# Patient Record
Sex: Male | Born: 1960 | Race: White | Hispanic: No | Marital: Married | Smoking: Never smoker
Health system: Southern US, Community
[De-identification: ages and names within clinical notes are randomized; demographics above are authoritative.]

## PROBLEM LIST (undated history)

## (undated) DIAGNOSIS — M1990 Unspecified osteoarthritis, unspecified site: Secondary | ICD-10-CM

## (undated) DIAGNOSIS — E349 Endocrine disorder, unspecified: Secondary | ICD-10-CM

## (undated) DIAGNOSIS — R911 Solitary pulmonary nodule: Secondary | ICD-10-CM

## (undated) DIAGNOSIS — I10 Essential (primary) hypertension: Secondary | ICD-10-CM

## (undated) DIAGNOSIS — I499 Cardiac arrhythmia, unspecified: Secondary | ICD-10-CM

## (undated) DIAGNOSIS — L723 Sebaceous cyst: Secondary | ICD-10-CM

## (undated) DIAGNOSIS — D649 Anemia, unspecified: Secondary | ICD-10-CM

## (undated) DIAGNOSIS — I480 Paroxysmal atrial fibrillation: Secondary | ICD-10-CM

## (undated) DIAGNOSIS — E559 Vitamin D deficiency, unspecified: Secondary | ICD-10-CM

## (undated) DIAGNOSIS — K5792 Diverticulitis of intestine, part unspecified, without perforation or abscess without bleeding: Secondary | ICD-10-CM

## (undated) DIAGNOSIS — K219 Gastro-esophageal reflux disease without esophagitis: Secondary | ICD-10-CM

## (undated) DIAGNOSIS — R7309 Other abnormal glucose: Secondary | ICD-10-CM

## (undated) DIAGNOSIS — E291 Testicular hypofunction: Secondary | ICD-10-CM

## (undated) DIAGNOSIS — E785 Hyperlipidemia, unspecified: Secondary | ICD-10-CM

## (undated) HISTORY — DX: Anemia, unspecified: D64.9

## (undated) HISTORY — DX: Sebaceous cyst: L72.3

## (undated) HISTORY — DX: Vitamin D deficiency, unspecified: E55.9

## (undated) HISTORY — DX: Paroxysmal atrial fibrillation: I48.0

## (undated) HISTORY — DX: Essential (primary) hypertension: I10

## (undated) HISTORY — DX: Other abnormal glucose: R73.09

## (undated) HISTORY — DX: Diverticulitis of intestine, part unspecified, without perforation or abscess without bleeding: K57.92

## (undated) HISTORY — DX: Endocrine disorder, unspecified: E34.9

## (undated) HISTORY — PX: COLONOSCOPY: SHX174

## (undated) HISTORY — DX: Cardiac arrhythmia, unspecified: I49.9

## (undated) HISTORY — DX: Solitary pulmonary nodule: R91.1

## (undated) HISTORY — DX: Testicular hypofunction: E29.1

## (undated) HISTORY — DX: Hyperlipidemia, unspecified: E78.5

---

## 2005-12-29 ENCOUNTER — Encounter: Payer: Self-pay | Admitting: Gastroenterology

## 2007-07-26 HISTORY — PX: KNEE ARTHROSCOPY W/ MENISCAL REPAIR: SHX1877

## 2008-06-05 ENCOUNTER — Encounter: Admission: RE | Admit: 2008-06-05 | Discharge: 2008-06-05 | Payer: Self-pay | Admitting: *Deleted

## 2008-07-31 ENCOUNTER — Encounter: Admission: RE | Admit: 2008-07-31 | Discharge: 2008-07-31 | Payer: Self-pay | Admitting: *Deleted

## 2009-03-31 ENCOUNTER — Encounter: Payer: Self-pay | Admitting: Gastroenterology

## 2009-05-25 ENCOUNTER — Ambulatory Visit: Payer: Self-pay | Admitting: Gastroenterology

## 2009-06-15 ENCOUNTER — Ambulatory Visit: Payer: Self-pay | Admitting: Gastroenterology

## 2009-10-09 IMAGING — CR DG CHEST 2V
3 series · 3 of 3 positions shown · non-contrast
Comparison: None

CLINICAL DATA: Asbestos medical surveillance.  Previous exposure to
asbestos.

CHEST - 2 VIEW

[view not recorded (1 of 3)]
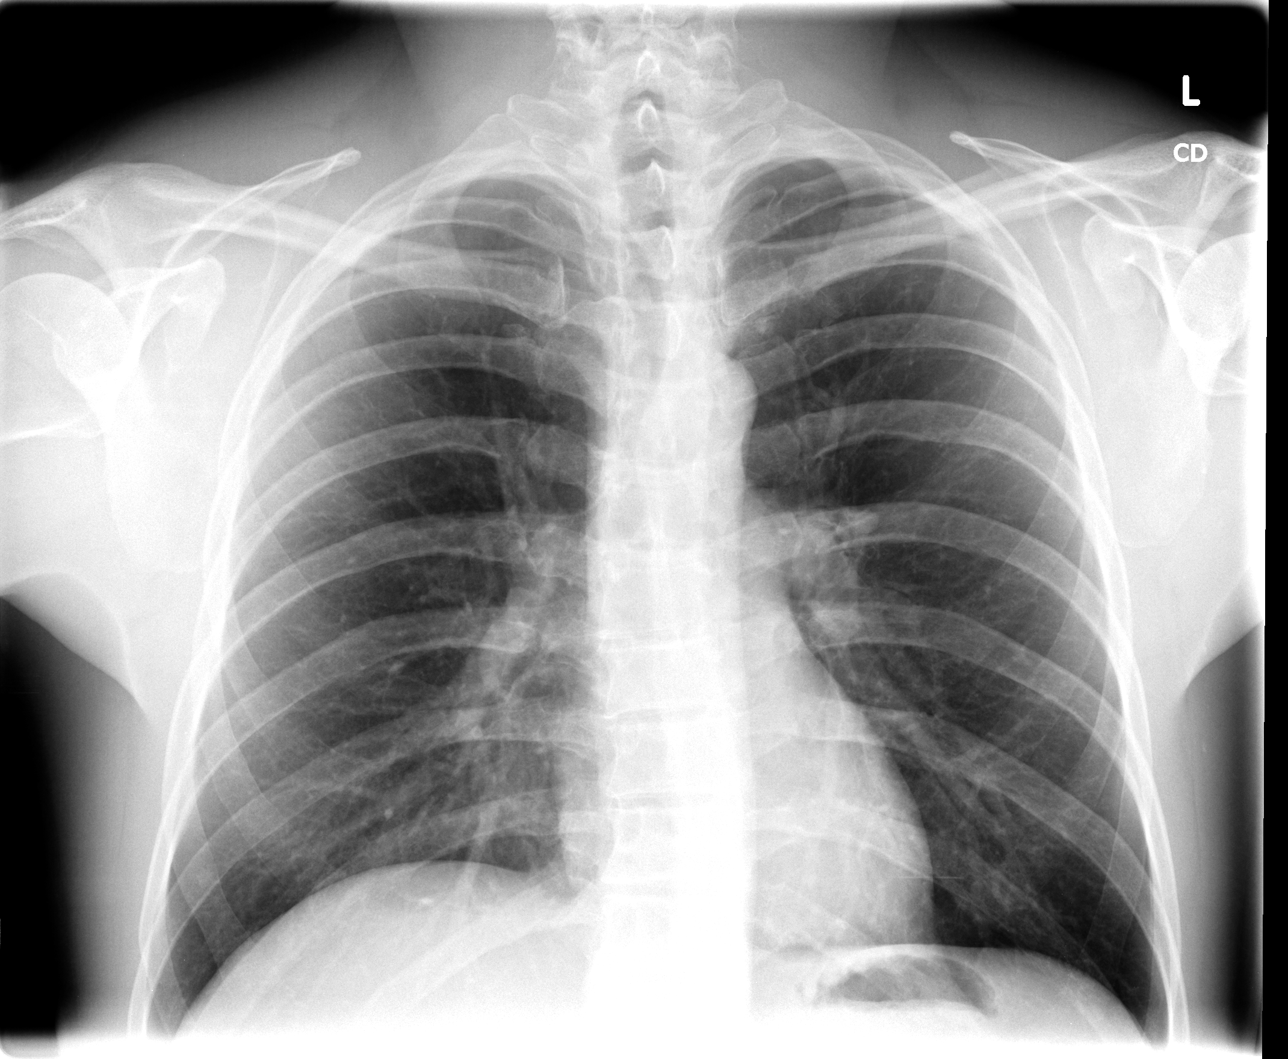

[view not recorded (2 of 3)]
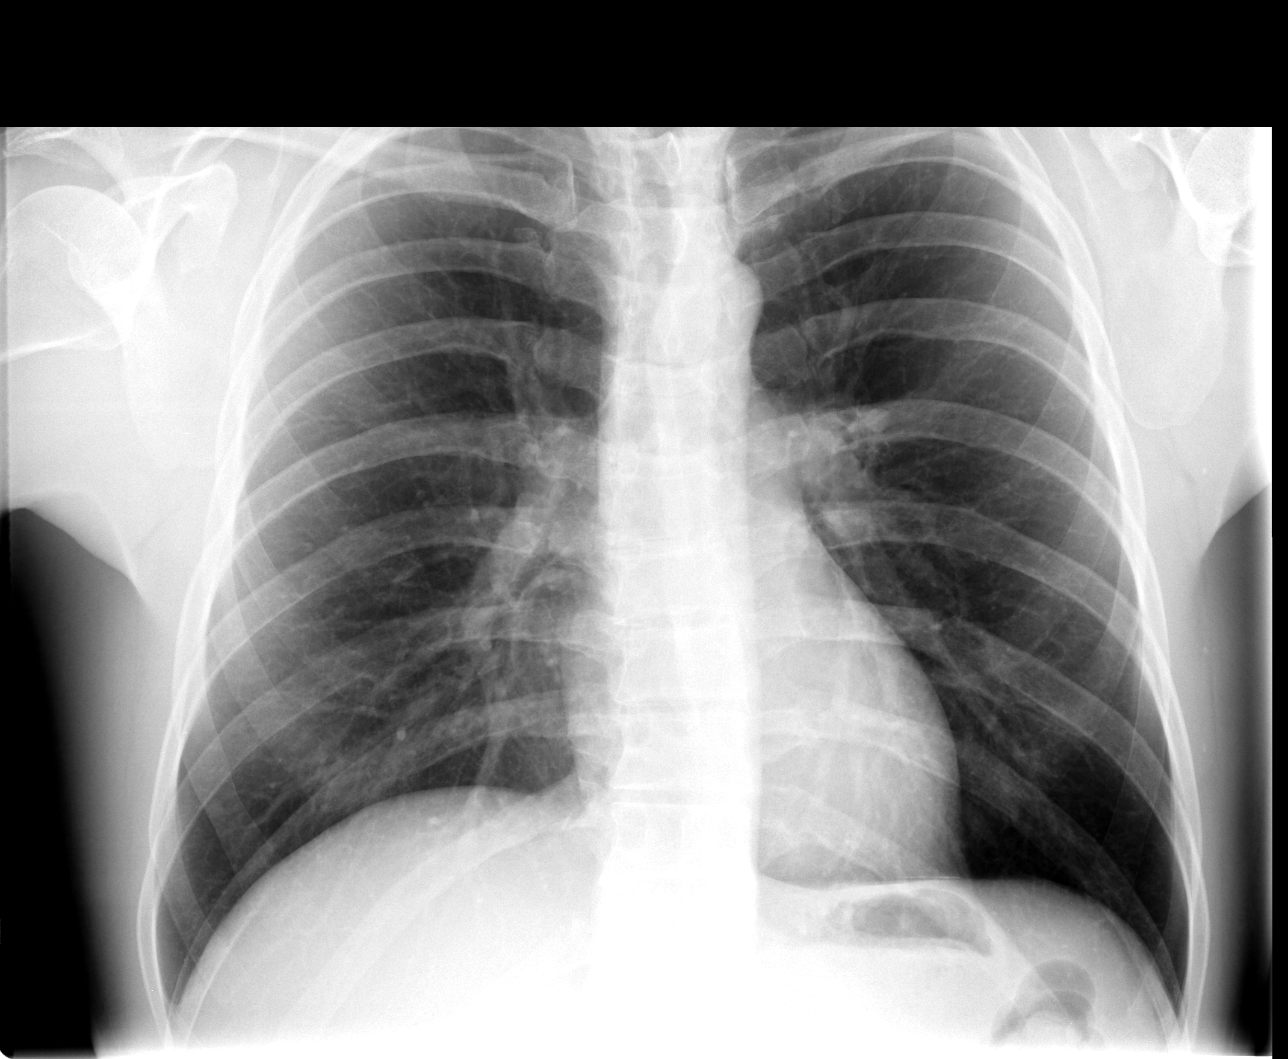

[view not recorded (3 of 3)]
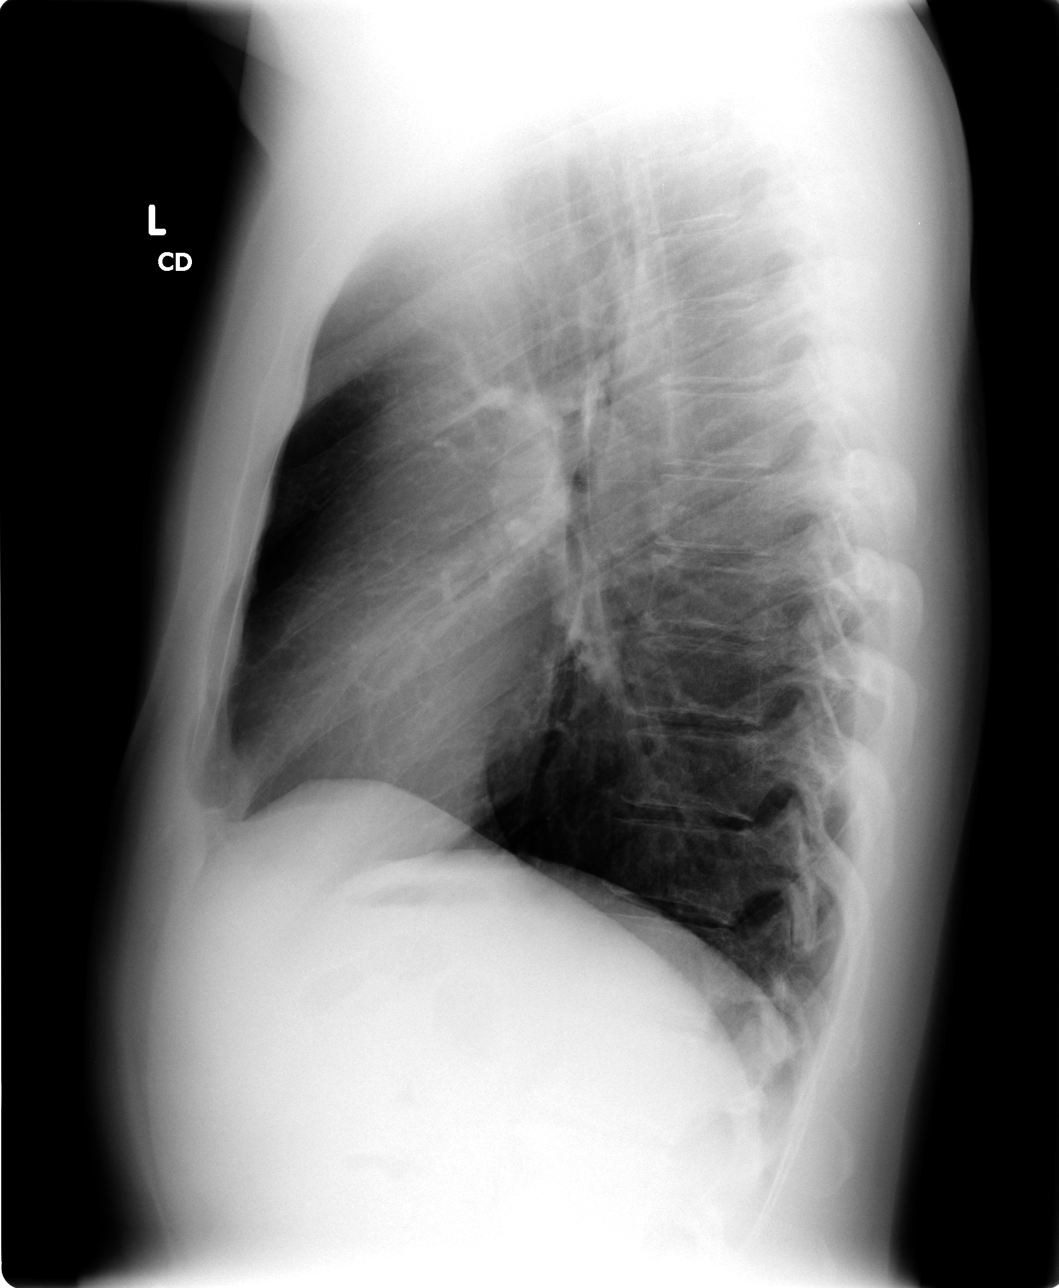

[3 of 3 positions shown; findings below may reference images not displayed]

FINDINGS: No findings to suggest asbestosis, related chest
findings.  No evidence of pleural thickening/calcifications.  No
accentuation of interstitial markings.  5 mm nodule in the medial
aspect of the left upper lobea this may represent a granuloma.
IMPRESSION: No acute chest findings.  No findings to suggest
asbestosis.

 5 mm left upper lobe nodule. Recommend follow-up CXR:  If the
patient is at high risk for bronchogenic carcinoma, follow-up chest
CT at 6-12 months is recommended.  If the patient is at low risk
for bronchogenic carcinoma, follow-up chest CT at 12 months is
recommended.  This recommendation follows the consensus statement:
"Guidelines for Management of Small Pulmonary Nodules Detected on
CT Scans: A Statement from the [HOSPITAL]" as published in
[URL]

## 2009-12-04 IMAGING — CR DG CHEST 2V
3 series · 3 of 3 positions shown · non-contrast
Comparison: 06/05/2008

CLINICAL DATA: Asbestos.  Medical surveillance.

CHEST - 2 VIEW

[view not recorded (1 of 3)]
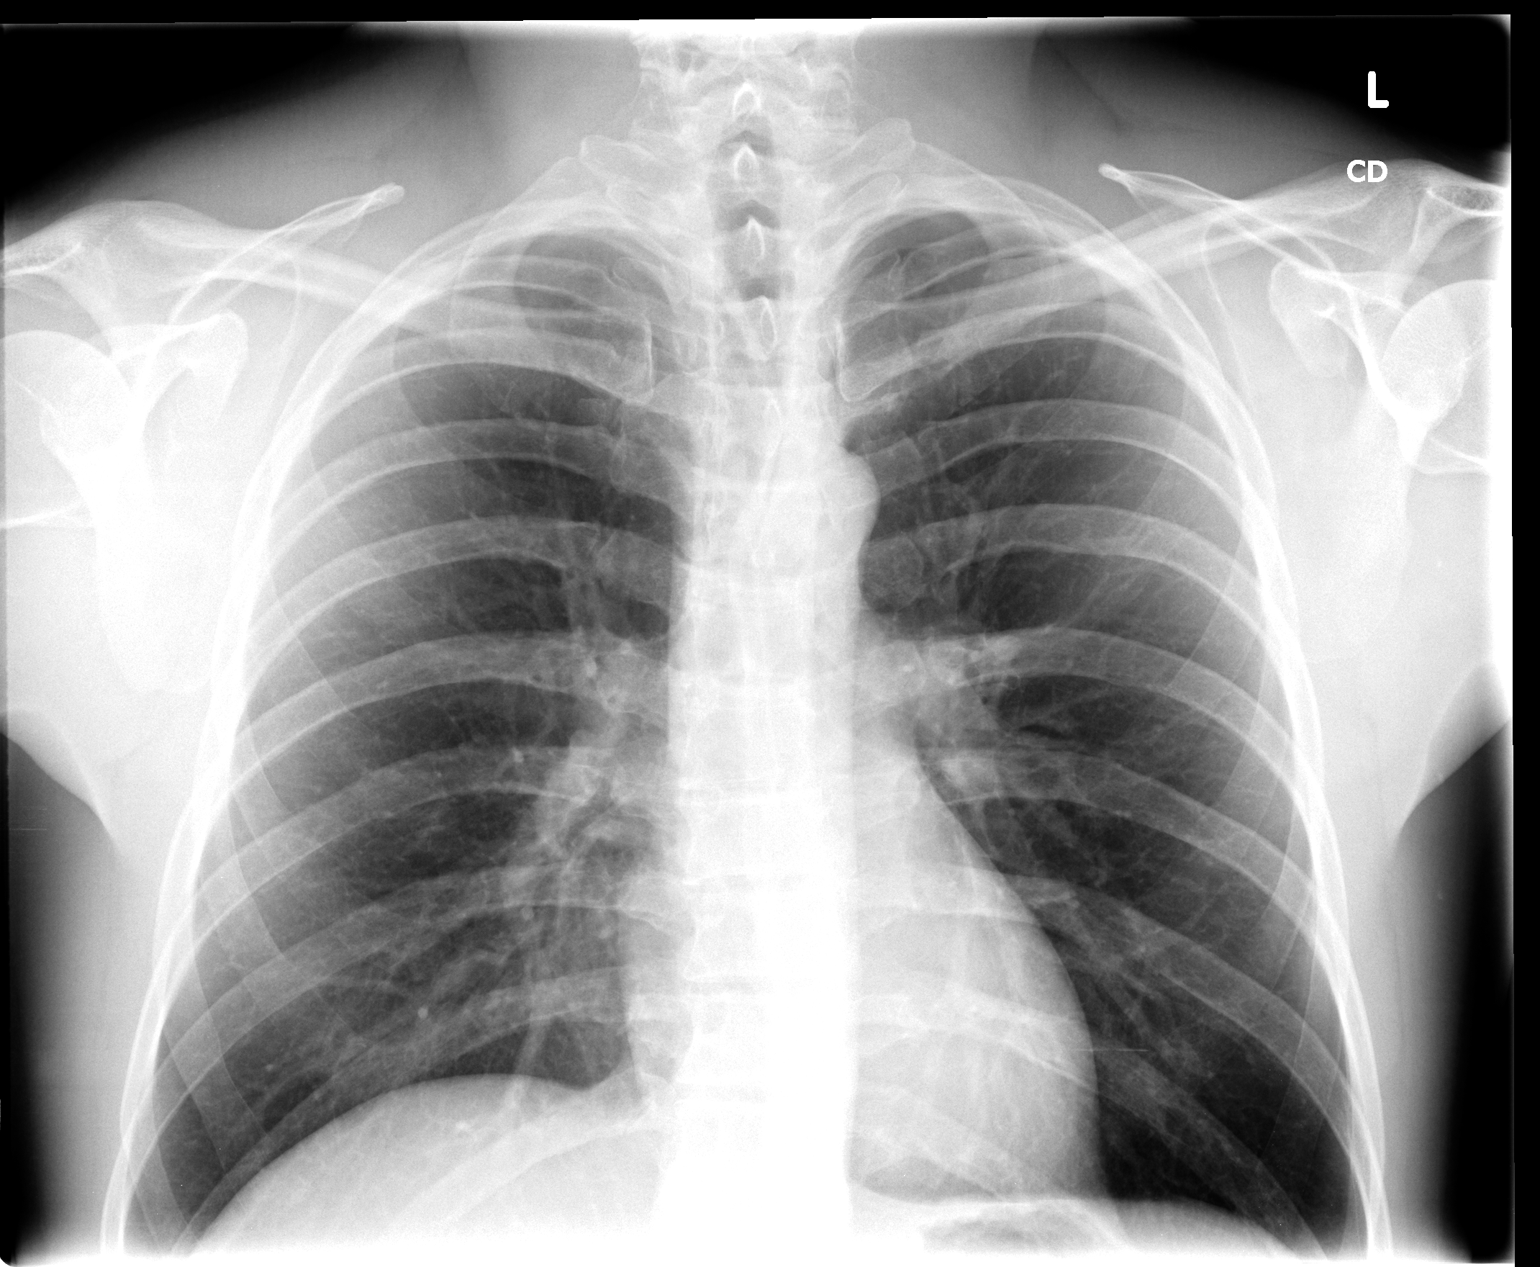

[view not recorded (2 of 3)]
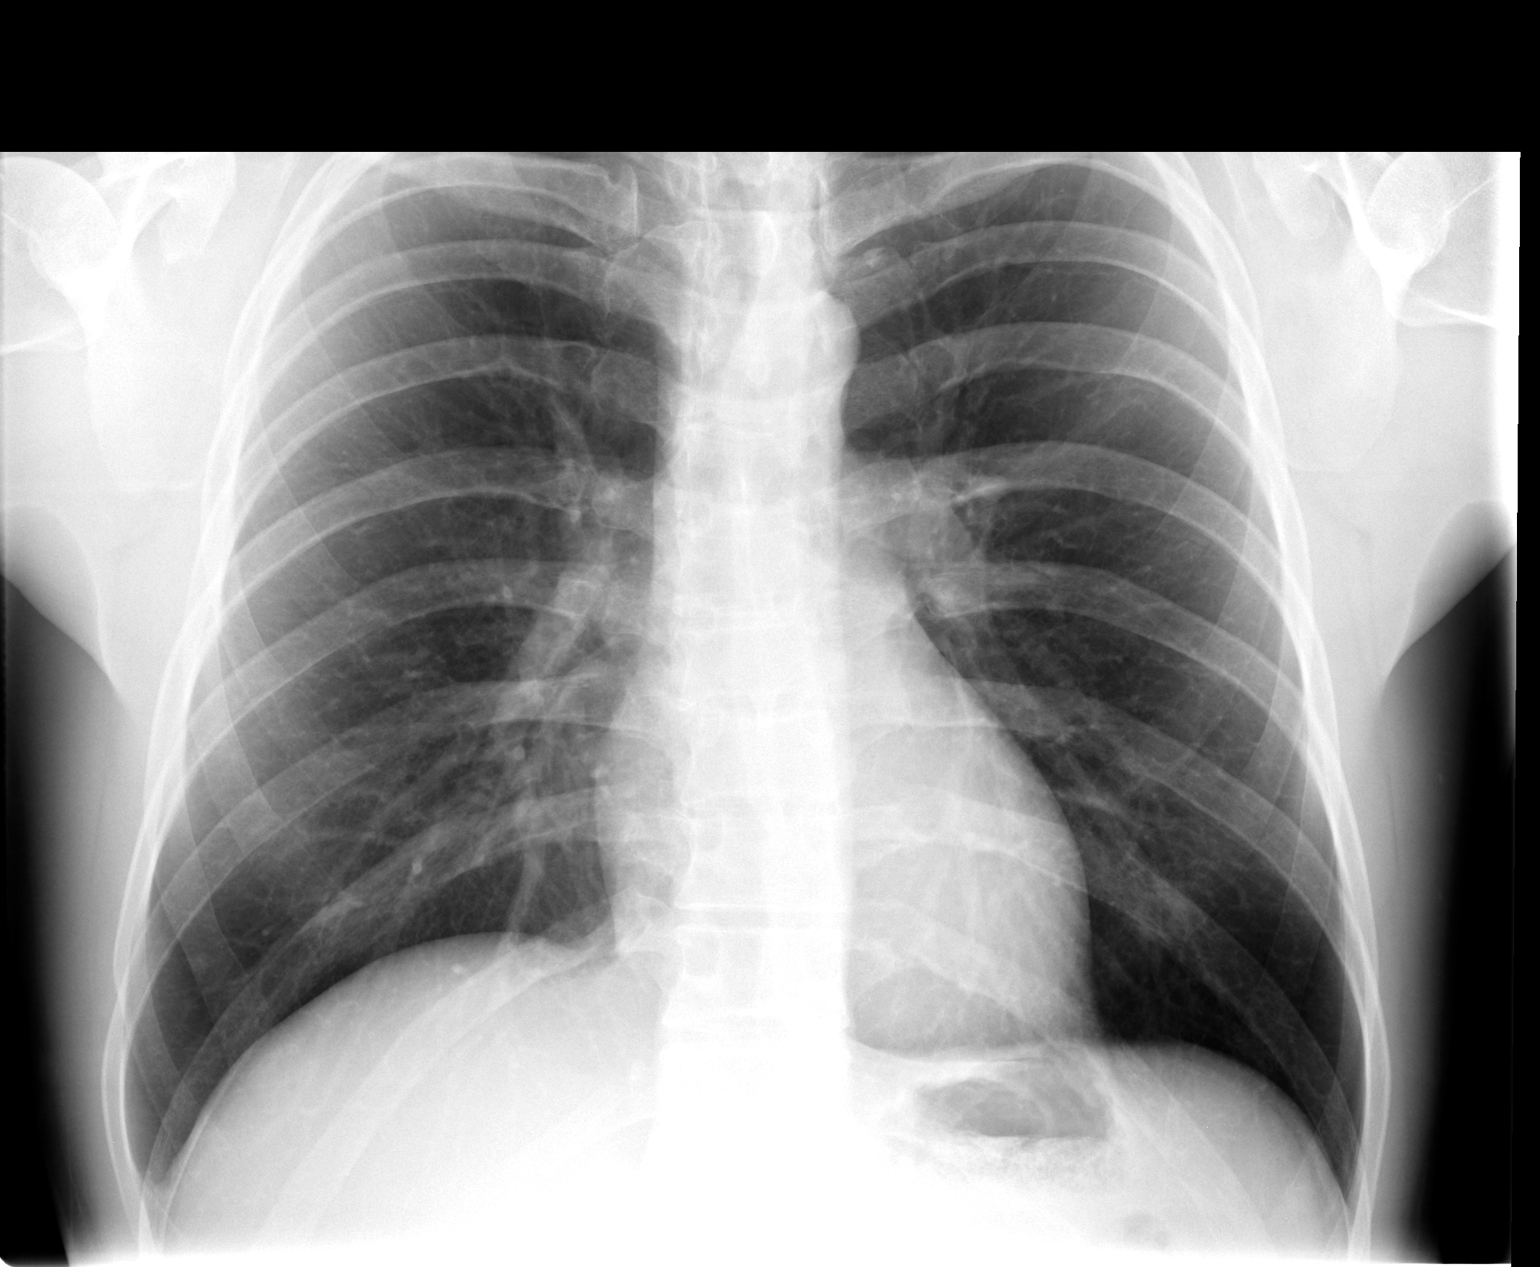

[view not recorded (3 of 3)]
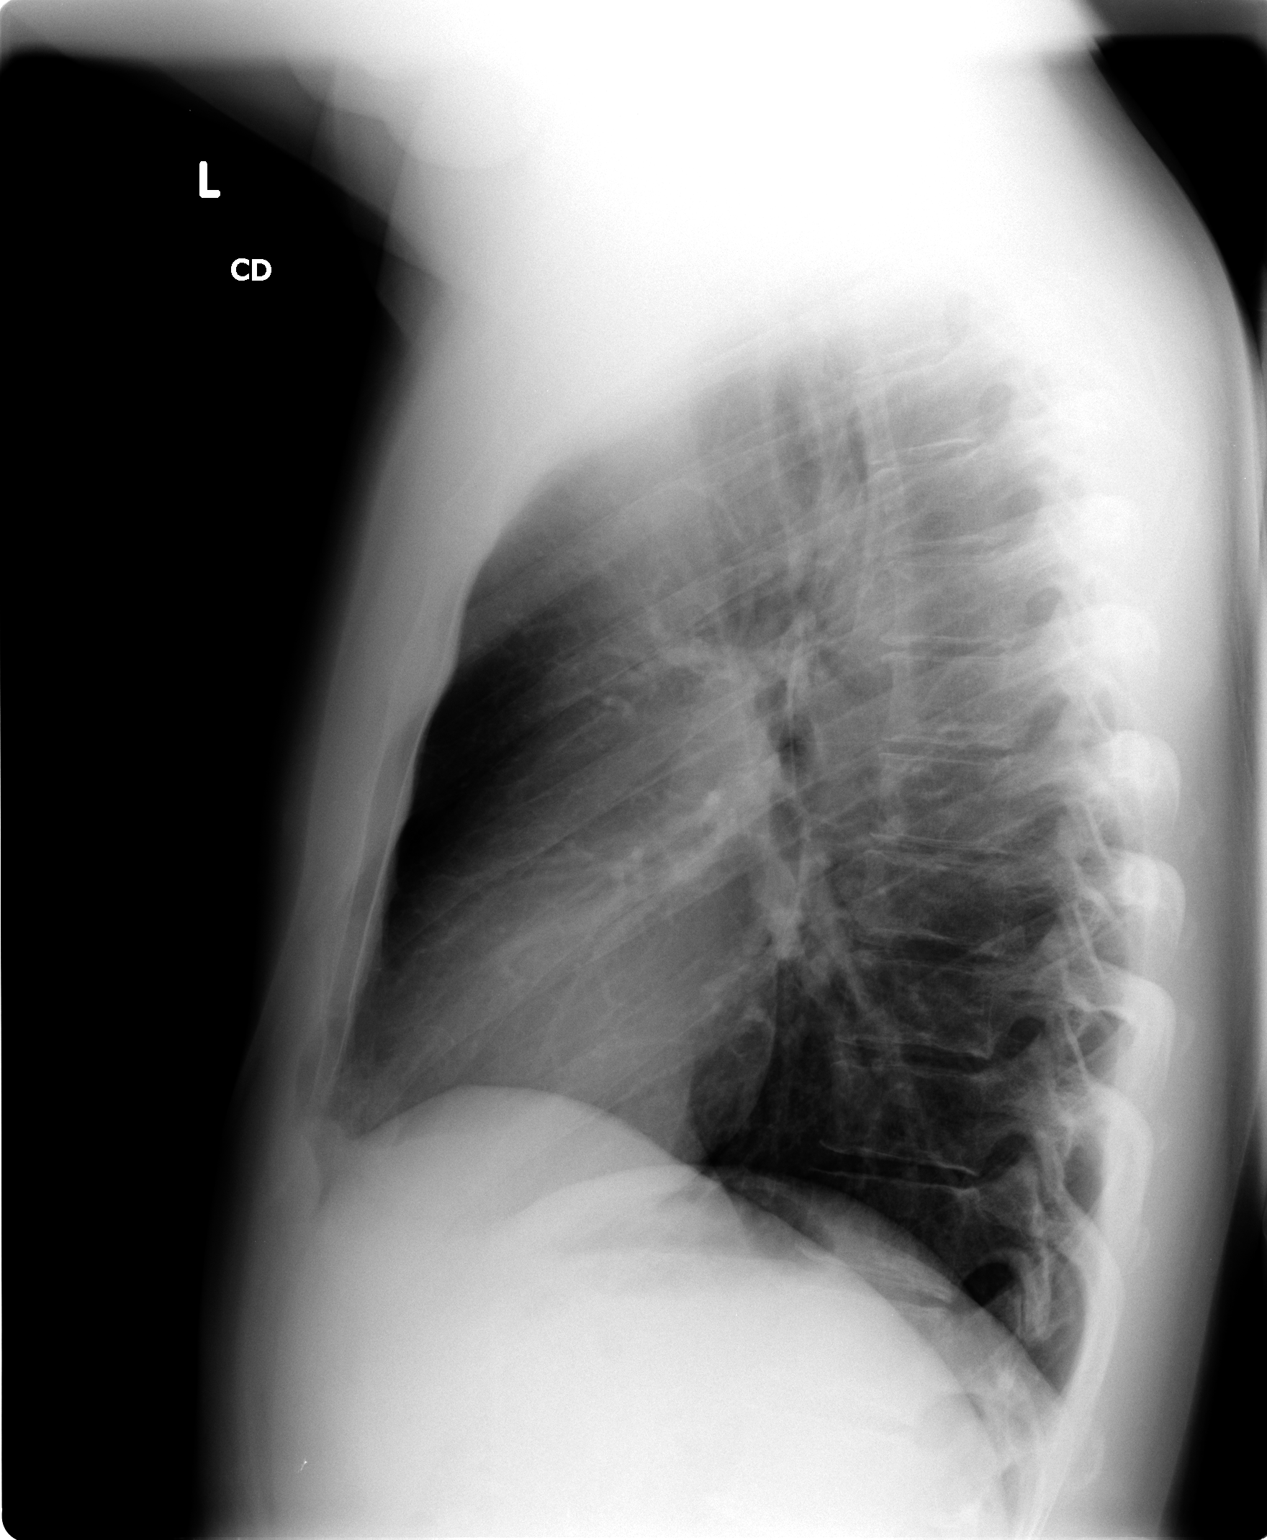

[3 of 3 positions shown; findings below may reference images not displayed]

FINDINGS: No change in 5 mm nodule in the medial aspect of the left
apex.  Likely the nodule represents a granuloma. Cardiomediastinal
silhouette stable.
IMPRESSION: Stable chest.  Stable small likely benign nodule in the medial left
apex.  Recommend follow-up CXR in12 months.

## 2013-04-29 ENCOUNTER — Other Ambulatory Visit: Payer: Self-pay | Admitting: Orthopedic Surgery

## 2013-05-09 ENCOUNTER — Encounter (HOSPITAL_BASED_OUTPATIENT_CLINIC_OR_DEPARTMENT_OTHER): Payer: Self-pay | Admitting: *Deleted

## 2013-05-09 NOTE — Progress Notes (Signed)
No labs needed

## 2013-05-14 ENCOUNTER — Encounter (HOSPITAL_BASED_OUTPATIENT_CLINIC_OR_DEPARTMENT_OTHER)
Admission: RE | Disposition: A | Discharge status: Home / Self Care | Payer: Self-pay | Source: Ambulatory Visit | Attending: Orthopedic Surgery

## 2013-05-14 ENCOUNTER — Encounter (HOSPITAL_BASED_OUTPATIENT_CLINIC_OR_DEPARTMENT_OTHER): Payer: 59 | Admitting: Certified Registered Nurse Anesthetist

## 2013-05-14 ENCOUNTER — Encounter (HOSPITAL_BASED_OUTPATIENT_CLINIC_OR_DEPARTMENT_OTHER): Payer: Self-pay | Admitting: Certified Registered Nurse Anesthetist

## 2013-05-14 ENCOUNTER — Ambulatory Visit (HOSPITAL_BASED_OUTPATIENT_CLINIC_OR_DEPARTMENT_OTHER)
Admission: RE | Admit: 2013-05-14 | Discharge: 2013-05-14 | Disposition: A | Discharge status: Home / Self Care | Payer: 59 | Source: Ambulatory Visit | Attending: Orthopedic Surgery | Admitting: Orthopedic Surgery

## 2013-05-14 ENCOUNTER — Ambulatory Visit (HOSPITAL_BASED_OUTPATIENT_CLINIC_OR_DEPARTMENT_OTHER): Payer: 59 | Admitting: Certified Registered Nurse Anesthetist

## 2013-05-14 DIAGNOSIS — IMO0002 Reserved for concepts with insufficient information to code with codable children: Secondary | ICD-10-CM | POA: Insufficient documentation

## 2013-05-14 DIAGNOSIS — M129 Arthropathy, unspecified: Secondary | ICD-10-CM | POA: Insufficient documentation

## 2013-05-14 DIAGNOSIS — M20029 Boutonniere deformity of unspecified finger(s): Secondary | ICD-10-CM | POA: Insufficient documentation

## 2013-05-14 DIAGNOSIS — K219 Gastro-esophageal reflux disease without esophagitis: Secondary | ICD-10-CM | POA: Insufficient documentation

## 2013-05-14 DIAGNOSIS — Z79899 Other long term (current) drug therapy: Secondary | ICD-10-CM | POA: Insufficient documentation

## 2013-05-14 HISTORY — DX: Gastro-esophageal reflux disease without esophagitis: K21.9

## 2013-05-14 HISTORY — DX: Unspecified osteoarthritis, unspecified site: M19.90

## 2013-05-14 HISTORY — PX: PROXIMAL INTERPHALANGEAL FUSION (PIP): SHX6043

## 2013-05-14 LAB — POCT HEMOGLOBIN-HEMACUE: Hemoglobin: 16.1 g/dL (ref 13.0–17.0)

## 2013-05-14 SURGERY — FUSION, PIP JOINT
Anesthesia: General | Site: Finger | Laterality: Left | Wound class: Clean

## 2013-05-14 MED ORDER — LIDOCAINE HCL (CARDIAC) 20 MG/ML IV SOLN
INTRAVENOUS | Status: DC | PRN
  Administered 2013-05-14: 60 mg via INTRAVENOUS

## 2013-05-14 MED ORDER — DEXAMETHASONE SODIUM PHOSPHATE 10 MG/ML IJ SOLN
INTRAMUSCULAR | Status: DC | PRN
  Administered 2013-05-14: 10 mg via INTRAVENOUS

## 2013-05-14 MED ORDER — FENTANYL CITRATE 0.05 MG/ML IJ SOLN
INTRAMUSCULAR | Status: AC
  Filled 2013-05-14: qty 6

## 2013-05-14 MED ORDER — OXYCODONE HCL 5 MG PO TABS
ORAL_TABLET | ORAL | Status: AC
  Filled 2013-05-14: qty 1

## 2013-05-14 MED ORDER — PROPOFOL 10 MG/ML IV BOLUS
INTRAVENOUS | Status: DC | PRN
  Administered 2013-05-14: 240 mg via INTRAVENOUS

## 2013-05-14 MED ORDER — BUPIVACAINE HCL (PF) 0.25 % IJ SOLN
INTRAMUSCULAR | Status: AC
  Filled 2013-05-14: qty 30

## 2013-05-14 MED ORDER — FENTANYL CITRATE 0.05 MG/ML IJ SOLN: 25.0000 ug | INTRAMUSCULAR | Status: DC | PRN

## 2013-05-14 MED ORDER — CEFAZOLIN SODIUM-DEXTROSE 2-3 GM-% IV SOLR
INTRAVENOUS | Status: AC
  Filled 2013-05-14: qty 50

## 2013-05-14 MED ORDER — MIDAZOLAM HCL 2 MG/2ML IJ SOLN
INTRAMUSCULAR | Status: AC
  Filled 2013-05-14: qty 2

## 2013-05-14 MED ORDER — PROPOFOL 10 MG/ML IV EMUL
INTRAVENOUS | Status: AC
  Filled 2013-05-14: qty 50

## 2013-05-14 MED ORDER — FENTANYL CITRATE 0.05 MG/ML IJ SOLN
INTRAMUSCULAR | Status: DC | PRN
  Administered 2013-05-14 (×2): 25 ug via INTRAVENOUS
  Administered 2013-05-14: 50 ug via INTRAVENOUS

## 2013-05-14 MED ORDER — LACTATED RINGERS IV SOLN
INTRAVENOUS | Status: DC
  Administered 2013-05-14 (×2): via INTRAVENOUS

## 2013-05-14 MED ORDER — CEFAZOLIN SODIUM-DEXTROSE 2-3 GM-% IV SOLR
2.0000 g | INTRAVENOUS | Status: AC
  Administered 2013-05-14: 2 g via INTRAVENOUS

## 2013-05-14 MED ORDER — MIDAZOLAM HCL 5 MG/5ML IJ SOLN
INTRAMUSCULAR | Status: DC | PRN
  Administered 2013-05-14: 2 mg via INTRAVENOUS

## 2013-05-14 MED ORDER — BUPIVACAINE HCL (PF) 0.25 % IJ SOLN
INTRAMUSCULAR | Status: DC | PRN
  Administered 2013-05-14: 8 mL

## 2013-05-14 MED ORDER — CHLORHEXIDINE GLUCONATE 4 % EX LIQD: 60.0000 mL | Freq: Once | CUTANEOUS | Status: DC

## 2013-05-14 MED ORDER — HYDROCODONE-ACETAMINOPHEN 5-325 MG PO TABS: 1.0000 | ORAL_TABLET | Freq: Four times a day (QID) | ORAL | Status: DC | PRN

## 2013-05-14 MED ORDER — OXYCODONE HCL 5 MG/5ML PO SOLN: 5.0000 mg | Freq: Once | ORAL | Status: AC | PRN

## 2013-05-14 MED ORDER — ONDANSETRON HCL 4 MG/2ML IJ SOLN
INTRAMUSCULAR | Status: DC | PRN
  Administered 2013-05-14: 4 mg via INTRAVENOUS

## 2013-05-14 MED ORDER — OXYCODONE HCL 5 MG PO TABS
5.0000 mg | ORAL_TABLET | Freq: Once | ORAL | Status: AC | PRN
  Administered 2013-05-14: 5 mg via ORAL

## 2013-05-14 MED ORDER — ONDANSETRON HCL 4 MG/2ML IJ SOLN: 4.0000 mg | Freq: Four times a day (QID) | INTRAMUSCULAR | Status: DC | PRN

## 2013-05-14 MED ORDER — 0.9 % SODIUM CHLORIDE (POUR BTL) OPTIME
TOPICAL | Status: DC | PRN
  Administered 2013-05-14: 300 mL

## 2013-05-14 SURGICAL SUPPLY — 52 items
BANDAGE GAUZE ELAST BULKY 4 IN (GAUZE/BANDAGES/DRESSINGS) ×2 IMPLANT
BLADE MINI RND TIP GREEN BEAV (BLADE) ×2 IMPLANT
BLADE SURG 15 STRL LF DISP TIS (BLADE) ×1 IMPLANT
BLADE SURG 15 STRL SS (BLADE) ×1
BNDG COHESIVE 3X5 TAN STRL LF (GAUZE/BANDAGES/DRESSINGS) ×2 IMPLANT
BNDG ESMARK 4X9 LF (GAUZE/BANDAGES/DRESSINGS) ×2 IMPLANT
BUR FAST CUTTING MED (BURR) IMPLANT
CHLORAPREP W/TINT 26ML (MISCELLANEOUS) ×2 IMPLANT
CORDS BIPOLAR (ELECTRODE) ×2 IMPLANT
COVER MAYO STAND STRL (DRAPES) ×2 IMPLANT
COVER TABLE BACK 60X90 (DRAPES) ×2 IMPLANT
CUFF TOURNIQUET SINGLE 18IN (TOURNIQUET CUFF) ×2 IMPLANT
DRAPE EXTREMITY T 121X128X90 (DRAPE) ×2 IMPLANT
DRAPE OEC MINIVIEW 54X84 (DRAPES) ×2 IMPLANT
DRAPE SURG 17X23 STRL (DRAPES) ×2 IMPLANT
DRSG KUZMA FLUFF (GAUZE/BANDAGES/DRESSINGS) ×2 IMPLANT
GAUZE XEROFORM 1X8 LF (GAUZE/BANDAGES/DRESSINGS) ×2 IMPLANT
GLOVE BIO SURGEON STRL SZ7.5 (GLOVE) ×2 IMPLANT
GLOVE BIOGEL PI IND STRL 7.0 (GLOVE) ×1 IMPLANT
GLOVE BIOGEL PI IND STRL 8 (GLOVE) ×1 IMPLANT
GLOVE BIOGEL PI IND STRL 8.5 (GLOVE) ×1 IMPLANT
GLOVE BIOGEL PI INDICATOR 7.0 (GLOVE) ×1
GLOVE BIOGEL PI INDICATOR 8 (GLOVE) ×1
GLOVE BIOGEL PI INDICATOR 8.5 (GLOVE) ×1
GLOVE EXAM NITRILE MD LF STRL (GLOVE) ×2 IMPLANT
GLOVE SURG ORTHO 8.0 STRL STRW (GLOVE) ×2 IMPLANT
GOWN BRE IMP PREV XXLGXLNG (GOWN DISPOSABLE) ×4 IMPLANT
GOWN PREVENTION PLUS XLARGE (GOWN DISPOSABLE) ×2 IMPLANT
K-WIRE .035X4 (WIRE) ×2 IMPLANT
NEEDLE 27GAX1X1/2 (NEEDLE) ×2 IMPLANT
NS IRRIG 1000ML POUR BTL (IV SOLUTION) ×2 IMPLANT
PACK BASIN DAY SURGERY FS (CUSTOM PROCEDURE TRAY) ×2 IMPLANT
PAD CAST 3X4 CTTN HI CHSV (CAST SUPPLIES) ×1 IMPLANT
PADDING CAST ABS 3INX4YD NS (CAST SUPPLIES)
PADDING CAST ABS 4INX4YD NS (CAST SUPPLIES) ×1
PADDING CAST ABS COTTON 3X4 (CAST SUPPLIES) IMPLANT
PADDING CAST ABS COTTON 4X4 ST (CAST SUPPLIES) ×1 IMPLANT
PADDING CAST COTTON 3X4 STRL (CAST SUPPLIES) ×1
SLEEVE SCD COMPRESS KNEE MED (MISCELLANEOUS) ×2 IMPLANT
SPLINT PLASTER CAST XFAST 3X15 (CAST SUPPLIES) ×10 IMPLANT
SPLINT PLASTER XTRA FASTSET 3X (CAST SUPPLIES) ×10
SPONGE GAUZE 4X4 12PLY (GAUZE/BANDAGES/DRESSINGS) ×2 IMPLANT
STOCKINETTE 4X48 STRL (DRAPES) ×2 IMPLANT
SUT MERSILENE 4 0 P 3 (SUTURE) ×2 IMPLANT
SUT SILK 4 0 PS 2 (SUTURE) ×2 IMPLANT
SUT VICRYL 4-0 PS2 18IN ABS (SUTURE) ×2 IMPLANT
SUT VICRYL RAPID 5 0 P 3 (SUTURE) ×2 IMPLANT
SUT VICRYL RAPIDE 4/0 PS 2 (SUTURE) IMPLANT
SYR BULB 3OZ (MISCELLANEOUS) ×2 IMPLANT
SYR CONTROL 10ML LL (SYRINGE) ×2 IMPLANT
TOWEL OR 17X24 6PK STRL BLUE (TOWEL DISPOSABLE) ×4 IMPLANT
UNDERPAD 30X30 INCONTINENT (UNDERPADS AND DIAPERS) ×2 IMPLANT

## 2013-05-14 NOTE — Anesthesia Procedure Notes (Signed)
Procedure Name: LMA Insertion Date/Time: 05/14/2013 8:40 AM Performed by: Nicki Reaper Pre-anesthesia Checklist: Patient identified, Emergency Drugs available, Suction available and Patient being monitored Patient Re-evaluated:Patient Re-evaluated prior to inductionOxygen Delivery Method: Circle System Utilized Preoxygenation: Pre-oxygenation with 100% oxygen Intubation Type: IV induction Ventilation: Mask ventilation without difficulty LMA: LMA inserted LMA Size: 5.0 Number of attempts: 1 Airway Equipment and Method: bite block Placement Confirmation: positive ETCO2 Tube secured with: Tape Dental Injury: Teeth and Oropharynx as per pre-operative assessment

## 2013-05-14 NOTE — H&P (Signed)
  Jack Russell is a 52 year old right hand dominant male complaining of an injury to his left small finger. The injury occurred on 03-12-13 [place]. He suffered a jamming injury. 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 He did not seek medical attention. He has no prior history of injury. He complains of intermittent, moderate, aching type pain with a feeling of swelling at the PIP joint. He has been taking Advil. Activity makes it worse and rest makes it better.   PAST MEDICAL HISTORY: He has no known drug allergies. He is on Meloxicam for arthritis and Ranitidine. He has had a miniscule tear.  FAMILY H ISTORY: Positive for heart disease and arthritis.  SOCIAL HISTORY: He does not smoke or drink. He works at [employer].  REVIEW OF SYSTEMS: Negative for 14 points.  ABBIE Russell is an 52 y.o. male.   Chief Complaint: boutonniere lsf HPI: see above  Past Medical History  Diagnosis Date  . GERD (gastroesophageal reflux disease)   . Arthritis     Past Surgical History  Procedure Laterality Date  . Knee arthroscopy w/ meniscal repair  2009    right  . Colonoscopy      History reviewed. No pertinent family history. Social History:  reports that he has never smoked. He does not have any smokeless tobacco history on file. He reports that he does not drink alcohol or use illicit drugs.  Allergies: No Known Allergies  No prescriptions prior to admission    No results found for this or any previous visit (from the past 48 hour(s)).  No results found.   Pertinent items are noted in HPI.  Height 6' (1.829 m), weight 190 lb (86.183 kg).  General appearance: alert, cooperative and appears stated age Head: Normocephalic, without obvious abnormality Neck: no JVD Resp: clear to auscultation bilaterally Cardio: regular rate and rhythm, S1, S2 normal, no murmur, click, rub or gallop GI: soft, non-tender; bowel sounds normal; no  masses,  no organomegaly Extremities: extremities normal, atraumatic, no cyanosis or edema Pulses: 2+ and symmetric Skin: Skin color, texture, turgor normal. No rashes or lesions Neurologic: Grossly normal Incision/Wound: na  Assessment/Plan X-rays reveal no fractures.    Diagnosis: Central slip fracture with boutonniere deformity. He is scheduled for boutonniere reconstruction with pinning PIP joint left small finger He is aware that the PIP joint will be pinned. There is the potential for infection, recurrence of the deformities, stiffness, incomplete relief of symptoms, injury to arteries, nerves, and tendons. He would like to proceed. This will be scheduled as an outpatient under regional anesthesia.  Jack Russell 05/14/2013, 5:27 AM

## 2013-05-14 NOTE — Brief Op Note (Signed)
05/14/2013  9:55 AM  PATIENT:  Jack Russell  52 y.o. male  PRE-OPERATIVE DIAGNOSIS:  Boutonniere Deformity Left Small Finger  POST-OPERATIVE DIAGNOSIS:  Boutonniere Deformity Left Small Finger  PROCEDURE:  Procedure(s): RECONSTRUCTION BOUTONNIERE (LEFT SMALL FINGER) PINNING PROXIMAL INTERPHALANGEAL FUSION (PIP) (Left)  SURGEON:  Surgeon(s) and Role:    * Nicki Reaper, MD - Primary    * Tami Ribas, MD - Assisting  PHYSICIAN ASSISTANT:   ASSISTANTS: K Berkeley Vanaken,MD   ANESTHESIA:   local and general  EBL:  Total I/O In: 1400 [I.V.:1400] Out: -   BLOOD ADMINISTERED:none  DRAINS: none   LOCAL MEDICATIONS USED:  MARCAINE     SPECIMEN:  No Specimen  DISPOSITION OF SPECIMEN:  N/A  COUNTS:  YES  TOURNIQUET:   Total Tourniquet Time Documented: Upper Arm (Left) - 53 minutes Total: Upper Arm (Left) - 53 minutes   DICTATION: .Other Dictation: Dictation Number [id number]  PLAN OF CARE: Discharge to home after PACU  PATIENT DISPOSITION:  PACU - hemodynamically stable.

## 2013-05-14 NOTE — Op Note (Signed)
Dictation Number [id number]

## 2013-05-14 NOTE — Op Note (Signed)
NAME:  Jack Russell, Jack Russell NO.:  [id number]  MEDICAL RECORD NO.:  [id number]  LOCATION:                                 FACILITY:  PHYSICIAN:  Cindee Salt, M.D.            DATE OF BIRTH:  DATE OF PROCEDURE:  05/14/2013 DATE OF DISCHARGE:                              OPERATIVE REPORT   PREOPERATIVE DIAGNOSIS:  Boutonniere deformity, left small finger.  POSTOPERATIVE DIAGNOSIS:  Boutonniere deformity, left small finger.  OPERATION:  Reconstruction of near pending PIP joint using lateral bands, left small finger.  SURGEON:  Cindee Salt, MD  ASSISTANT:  Betha Loa, MD  ANESTHESIA:  General with metacarpal block.  HISTORY:  The patient is a 52 year old male who was playing football when he suffered an injury to the left small finger, did not seek medical attention.  Gradually noted the finger with the inability to extend at the PIP joint and greater extension at the DIP.  This was fixed initially.  He has undergone therapy for the boutonniere deformity along with splinting and has a lack of approximately 5-10 degrees of full extension.  He is admitted now for reconstruction.  Pre, peri, and postoperative course have been discussed along with risks and complications.  He is aware that there is no guarantee with the surgery, possibility of infection, recurrence injury to arteries, nerves, tendons, incomplete relief of symptoms, dystrophy, possibility of stiffness to the joint, necessity of pinning the joint and prolonged nature of therapy following the surgery in the preoperative area.  The patient is seen, the extremity marked by both the patient and surgeon. Antibiotic given.  PROCEDURE IN DETAIL:  The patient was brought to the operating room were general anesthetic was carried out without difficulty under the direction of Dr. Chaney Malling.  He was prepped using ChloraPrep, supine position with the left arm free.  A 3-minute dry time was allowed.  Time- out taken,  confirming patient and procedure.  The limb was exsanguinated with an Esmarch bandage.  Tourniquet placed high and the arm was inflated to 250 mmHg.  The incision over the dorsal aspect of the PIP joint proximal middle phalanges of the left small finger was made and carried down through subcutaneous tissue.  Bleeders were electrocauterized with bipolar.  The extensor tendon rupture was immediately apparent with significant scarring present over the entire tendon.  The retinacular fibers holding the lateral bands down were incised on each side.  The extensor tendon was mobilized.  The two central portions of the lateral bands were then incised longitudinally, these to be flipped over reconstructing it back to the middle phalanx reconstructing the central slip.  The area of scar in the central slip was excised.  The central slip was then repaired with a modified Kessler with figure-of-eight 4-0 Mersilene sutures.  A drill hole placed in the middle phalanx.  A 4-0 Mersilene suture was then used to secure the lateral band and central slip down to the middle phalanx.  The joint was pinned in full extension and confirmed on x-ray.  The wound was copiously irrigated with saline.  The skin was then closed with interrupted  4-0 Vicryl Rapide sutures.  Sterile compressive dressing and splint was applied.  On deflation of the tourniquet, all fingers immediately pinked.  He was taken to the recovery room for observation in satisfactory condition. He will be discharged home to return in 1 week on Vicodin.          ______________________________ Cindee Salt, M.D.     GK/MEDQ  D:  05/14/2013  T:  05/14/2013  Job:  [id number]

## 2013-05-14 NOTE — Anesthesia Preprocedure Evaluation (Addendum)
Anesthesia Evaluation  Patient identified by MRN, date of birth, ID band Patient awake    Reviewed: Allergy & Precautions, H&P , NPO status , Patient's Chart, lab work & pertinent test results  Airway Mallampati: II  Neck ROM: full    Dental   Pulmonary          Cardiovascular     Neuro/Psych    GI/Hepatic GERD-  Medicated,  Endo/Other    Renal/GU      Musculoskeletal  (+) Arthritis -,   Abdominal   Peds  Hematology   Anesthesia Other Findings   Reproductive/Obstetrics                          Anesthesia Physical Anesthesia Plan  ASA: II  Anesthesia Plan: General   Post-op Pain Management:    Induction: Intravenous  Airway Management Planned: LMA  Additional Equipment:   Intra-op Plan:   Post-operative Plan:   Informed Consent: I have reviewed the patients History and Physical, chart, labs and discussed the procedure including the risks, benefits and alternatives for the proposed anesthesia with the patient or authorized representative who has indicated his/her understanding and acceptance.     Plan Discussed with: CRNA, Anesthesiologist and Surgeon  Anesthesia Plan Comments:         Anesthesia Quick Evaluation

## 2013-05-14 NOTE — Transfer of Care (Signed)
Immediate Anesthesia Transfer of Care Note  Patient: Jack Russell  Procedure(s) Performed: Procedure(s): RECONSTRUCTION BOUTONNIERE (LEFT SMALL FINGER) PINNING PROXIMAL INTERPHALANGEAL FUSION (PIP) (Left)  Patient Location: PACU  Anesthesia Type:General  Level of Consciousness: awake and patient cooperative  Airway & Oxygen Therapy: Patient Spontanous Breathing and Patient connected to face mask oxygen  Post-op Assessment: Report given to PACU RN and Post -op Vital signs reviewed and stable  Post vital signs: Reviewed and stable  Complications: No apparent anesthesia complications

## 2013-05-14 NOTE — Anesthesia Postprocedure Evaluation (Signed)
Anesthesia Post Note  Patient: Jack Russell  Procedure(s) Performed: Procedure(s) (LRB): RECONSTRUCTION BOUTONNIERE (LEFT SMALL FINGER) PINNING PROXIMAL INTERPHALANGEAL FUSION (PIP) (Left)  Anesthesia type: General  Patient location: PACU  Post pain: Pain level controlled and Adequate analgesia  Post assessment: Post-op Vital signs reviewed, Patient's Cardiovascular Status Stable, Respiratory Function Stable, Patent Airway and Pain level controlled  Last Vitals:  Filed Vitals:   05/14/13 1030  BP: 132/90  Pulse: 87  Temp:   Resp: 17    Post vital signs: Reviewed and stable  Level of consciousness: awake, alert  and oriented  Complications: No apparent anesthesia complications

## 2013-05-16 ENCOUNTER — Encounter (HOSPITAL_BASED_OUTPATIENT_CLINIC_OR_DEPARTMENT_OTHER): Payer: Self-pay | Admitting: Orthopedic Surgery

## 2013-06-25 ENCOUNTER — Other Ambulatory Visit: Payer: Self-pay | Admitting: Internal Medicine

## 2013-07-04 ENCOUNTER — Other Ambulatory Visit: Payer: Self-pay | Admitting: Internal Medicine

## 2013-08-02 ENCOUNTER — Other Ambulatory Visit: Payer: Self-pay | Admitting: Internal Medicine

## 2013-08-02 DIAGNOSIS — J019 Acute sinusitis, unspecified: Secondary | ICD-10-CM

## 2013-08-02 MED ORDER — CIPROFLOXACIN HCL 500 MG PO TABS: 500.0000 mg | ORAL_TABLET | Freq: Two times a day (BID) | ORAL | Status: AC

## 2013-08-02 MED ORDER — AZITHROMYCIN 250 MG PO TABS: ORAL_TABLET | ORAL | Status: AC

## 2013-08-02 MED ORDER — AZITHROMYCIN 250 MG PO TABS: ORAL_TABLET | ORAL | Status: DC

## 2013-10-06 DIAGNOSIS — K219 Gastro-esophageal reflux disease without esophagitis: Secondary | ICD-10-CM | POA: Insufficient documentation

## 2013-10-06 DIAGNOSIS — E782 Mixed hyperlipidemia: Secondary | ICD-10-CM | POA: Insufficient documentation

## 2013-10-06 DIAGNOSIS — R7309 Other abnormal glucose: Secondary | ICD-10-CM | POA: Insufficient documentation

## 2013-10-06 DIAGNOSIS — E349 Endocrine disorder, unspecified: Secondary | ICD-10-CM | POA: Insufficient documentation

## 2013-10-06 DIAGNOSIS — I10 Essential (primary) hypertension: Secondary | ICD-10-CM | POA: Insufficient documentation

## 2013-10-10 ENCOUNTER — Encounter: Payer: Self-pay | Admitting: Internal Medicine

## 2013-10-10 ENCOUNTER — Encounter: Payer: Self-pay | Admitting: Physician Assistant

## 2013-10-10 ENCOUNTER — Ambulatory Visit (INDEPENDENT_AMBULATORY_CARE_PROVIDER_SITE_OTHER): Payer: 59 | Admitting: Physician Assistant

## 2013-10-10 VITALS — BP 122/80 | HR 64 | Temp 98.1°F | Resp 16 | Ht 72.0 in | Wt 202.0 lb

## 2013-10-10 DIAGNOSIS — R7303 Prediabetes: Secondary | ICD-10-CM

## 2013-10-10 DIAGNOSIS — Z79899 Other long term (current) drug therapy: Secondary | ICD-10-CM

## 2013-10-10 DIAGNOSIS — E559 Vitamin D deficiency, unspecified: Secondary | ICD-10-CM

## 2013-10-10 DIAGNOSIS — E785 Hyperlipidemia, unspecified: Secondary | ICD-10-CM

## 2013-10-10 DIAGNOSIS — R7309 Other abnormal glucose: Secondary | ICD-10-CM

## 2013-10-10 DIAGNOSIS — E291 Testicular hypofunction: Secondary | ICD-10-CM

## 2013-10-10 DIAGNOSIS — I10 Essential (primary) hypertension: Secondary | ICD-10-CM

## 2013-10-10 LAB — CBC WITH DIFFERENTIAL/PLATELET
Basophils Absolute: 0.1 10*3/uL (ref 0.0–0.1)
Basophils Relative: 2 % — ABNORMAL HIGH (ref 0–1)
Eosinophils Absolute: 0.4 10*3/uL (ref 0.0–0.7)
Eosinophils Relative: 8 % — ABNORMAL HIGH (ref 0–5)
HCT: 43.8 % (ref 39.0–52.0)
HEMOGLOBIN: 15.4 g/dL (ref 13.0–17.0)
LYMPHS ABS: 1.9 10*3/uL (ref 0.7–4.0)
Lymphocytes Relative: 36 % (ref 12–46)
MCH: 30.4 pg (ref 26.0–34.0)
MCHC: 35.2 g/dL (ref 30.0–36.0)
MCV: 86.6 fL (ref 78.0–100.0)
Monocytes Absolute: 0.4 10*3/uL (ref 0.1–1.0)
Monocytes Relative: 7 % (ref 3–12)
NEUTROS ABS: 2.5 10*3/uL (ref 1.7–7.7)
NEUTROS PCT: 47 % (ref 43–77)
Platelets: 266 10*3/uL (ref 150–400)
RBC: 5.06 MIL/uL (ref 4.22–5.81)
RDW: 13.1 % (ref 11.5–15.5)
WBC: 5.3 10*3/uL (ref 4.0–10.5)

## 2013-10-10 MED ORDER — CYCLOBENZAPRINE HCL 10 MG PO TABS: 10.0000 mg | ORAL_TABLET | Freq: Three times a day (TID) | ORAL | Status: DC | PRN

## 2013-10-10 NOTE — Progress Notes (Signed)
HPI 53 y.o. male  presents for 6 month follow up with hypertension, hyperlipidemia,and vitamin D. His blood pressure has been controlled at home, today their BP is BP: 122/80 mmHg He does workout. He denies chest pain, shortness of breath, dizziness.  He is not on cholesterol medication and denies myalgias. His cholesterol is at goal. The cholesterol last visit was: LDL 102, Trigs 233, HDL 37.   Patient is on Vitamin D supplement.  Vitamin D 91.  Take Diltiazem for occ irreg heart beat and it stops it, he is on BASA, CHADS2 is 0.  Pulled lower back during soft ball practice, left side, denies radiation or numbness tingling.  Current Medications:  Current Outpatient Prescriptions on File Prior to Visit  Medication Sig Dispense Refill  . Ascorbic Acid (VITAMIN C) 1000 MG tablet Take 1,000 mg by mouth daily.      Marland Kitchen aspirin 81 MG tablet Take 81 mg by mouth daily.      . cholecalciferol (VITAMIN D) 1000 UNITS tablet Take 1,000 Units by mouth daily.      Marland Kitchen diltiazem (CARDIZEM CD) 180 MG 24 hr capsule Take 180 mg by mouth 2 (two) times daily.      . fish oil-omega-3 fatty acids 1000 MG capsule Take 2 g by mouth daily.      Marland Kitchen HYDROcodone-acetaminophen (NORCO) 5-325 MG per tablet Take 1 tablet by mouth every 6 (six) hours as needed for pain.  30 tablet  0  . meloxicam (MOBIC) 15 MG tablet TAKE 1 TABLET EVERY DAY AS NEEDED PAIN/INFLAMMATION  90 tablet  1  . ranitidine (ZANTAC) 150 MG tablet Take 150 mg by mouth every other day.       No current facility-administered medications on file prior to visit.   Medical History:  Past Medical History  Diagnosis Date  . Arthritis   . GERD (gastroesophageal reflux disease)   . Hypertension   . Other testicular hypofunction   . Prediabetes   . Hyperlipidemia    Allergies: No Known Allergies   Review of Systems: [X]  = complains of  [ ]  = denies  General: Fatigue [ ]  Fever [ ]  Chills [ ]  Weakness [ ]   Insomnia [ ]  Eyes: Redness [ ]  Blurred vision [ ]   Diplopia [ ]   ENT: Congestion [ ]  Sinus Pain [ ]  Post Nasal Drip [ ]  Sore Throat LEFT [ X] LEFT Earache Valu.Nieves ]  Since tuesday Cardiac: Chest pain/pressure [ ]  SOB [ ]  Orthopnea [ ]   Palpitations [ ]   Paroxysmal nocturnal dyspnea[ ]  Claudication [ ]  Edema [ ]   Pulmonary: Cough [ ]  Wheezing[ ]   SOB [ ]   Snoring [ ]   GI: Nausea [ ]  Vomiting[ ]  Dysphagia[ ]  Heartburn[ ]  Abdominal pain [ ]  Constipation [ ] ; Diarrhea [ ] ; BRBPR [ ]  Melena[ ]  GU: Hematuria[ ]  Dysuria [ ]  Nocturia[ ]  Urgency [ ]   Hesitancy [ ]  Discharge [ ]  Neuro: Headaches[ ]  Vertigo[ ]  Paresthesias[ ]  Spasm [ ]  Speech changes [ ]  Incoordination [ ]   Ortho: Arthritis [ ]  Joint pain [ ]  Muscle pain [ ]  Joint swelling [ ]  Back Pain [ ]  Skin:  Rash [ ]   Pruritis [ ]  Change in skin lesion [ ]   Psych: Depression[ ]  Anxiety[ ]  Confusion [ ]  Memory loss [ ]   Heme/Lypmh: Bleeding [ ]  Bruising [ ]  Enlarged lymph nodes [ ]   Endocrine: Visual blurring [ ]  Paresthesia [ ]  Polyuria [ ]  Polydypsea [ ]    Heat/cold  intolerance [ ]  Hypoglycemia [ ]   Family history- Review and unchanged Social history- Review and unchanged Physical Exam: BP 122/80  Pulse 64  Temp(Src) 98.1 F (36.7 C)  Resp 16  Ht 6' (1.829 m)  Wt 202 lb (91.627 kg)  BMI 27.39 kg/m2 Wt Readings from Last 3 Encounters:  10/10/13 202 lb (91.627 kg)  05/14/13 191 lb (86.637 kg)  05/14/13 191 lb (86.637 kg)   General Appearance: Well nourished, in no apparent distress. Eyes: PERRLA, EOMs, conjunctiva no swelling or erythema Sinuses: No Frontal/maxillary tenderness ENT/Mouth: Ext aud canals clear, TMs without erythema, bulging. No erythema, swelling, or exudate on post pharynx.  Tonsils not swollen or erythematous. Hearing normal.  Neck: Supple, thyroid normal.  Respiratory: Respiratory effort normal, BS equal bilaterally without rales, rhonchi, wheezing or stridor.  Cardio: RRR with no MRGs. Brisk peripheral pulses without edema.  Abdomen: Soft, + BS.  Non tender, no guarding,  rebound, hernias, masses. Lymphatics: Non tender without lymphadenopathy.  Musculoskeletal: Full ROM, 5/5 strength, normal gait.  Skin: Warm, dry without rashes, lesions, ecchymosis.  Neuro: Cranial nerves intact. Normal muscle tone, no cerebellar symptoms. Sensation intact.  Psych: Awake and oriented X 3, normal affect, Insight and Judgment appropriate.   Assessment and Plan:  Hypertension: Continue medication, monitor blood pressure at home. Continue DASH diet. Cholesterol: Continue diet and exercise. Check cholesterol.  Throat pain- exam is normal- will monitor, check CBC, if it does not get better than will refer ENT Vitamin D Def- check level and continue medications.  Lower back pain-? Pulled muscle- Flexeril 10 1 PRN BID #60  Continue diet and meds as discussed. Further disposition pending results of labs.  Vicie Mutters 4:07 PM

## 2013-10-10 NOTE — Patient Instructions (Signed)
Lumbosacral Strain Lumbosacral strain is a strain of any of the parts that make up your lumbosacral vertebrae. Your lumbosacral vertebrae are the bones that make up the lower third of your backbone. Your lumbosacral vertebrae are held together by muscles and tough, fibrous tissue (ligaments).  CAUSES  A sudden blow to your back can cause lumbosacral strain. Also, anything that causes an excessive stretch of the muscles in the low back can cause this strain. This is typically seen when people exert themselves strenuously, fall, lift heavy objects, bend, or crouch repeatedly. RISK FACTORS  Physically demanding work.  Participation in pushing or pulling sports or sports that require sudden twist of the back (tennis, golf, baseball).  Weight lifting.  Excessive lower back curvature.  Forward-tilted pelvis.  Weak back or abdominal muscles or both.  Tight hamstrings. SIGNS AND SYMPTOMS  Lumbosacral strain may cause pain in the area of your injury or pain that moves (radiates) down your leg.  DIAGNOSIS Your health care provider can often diagnose lumbosacral strain through a physical exam. In some cases, you may need tests such as X-ray exams.  TREATMENT  Treatment for your lower back injury depends on many factors that your clinician will have to evaluate. However, most treatment will include the use of anti-inflammatory medicines. HOME CARE INSTRUCTIONS   Avoid hard physical activities (tennis, racquetball, waterskiing) if you are not in proper physical condition for it. This may aggravate or create problems.  If you have a back problem, avoid sports requiring sudden body movements. Swimming and walking are generally safer activities.  Maintain good posture.  Maintain a healthy weight.  For acute conditions, you may put ice on the injured area.  Put ice in a plastic bag.  Place a towel between your skin and the bag.  Leave the ice on for 20 minutes, 2 3 times a day.  When the  low back starts healing, stretching and strengthening exercises may be recommended. SEEK MEDICAL CARE IF:  Your back pain is getting worse.  You experience severe back pain not relieved with medicines. SEEK IMMEDIATE MEDICAL CARE IF:   You have numbness, tingling, weakness, or problems with the use of your arms or legs.  There is a change in bowel or bladder control.  You have increasing pain in any area of the body, including your belly (abdomen).  You notice shortness of breath, dizziness, or feel faint.  You feel sick to your stomach (nauseous), are throwing up (vomiting), or become sweaty.  You notice discoloration of your toes or legs, or your feet get very cold. MAKE SURE YOU:   Understand these instructions.  Will watch your condition.  Will get help right away if you are not doing well or get worse. Document Released: 04/20/2005 Document Revised: 05/01/2013 Document Reviewed: 02/27/2013 [employer] Patient Information 2014 [employer].  

## 2013-10-11 ENCOUNTER — Other Ambulatory Visit: Payer: Self-pay | Admitting: Emergency Medicine

## 2013-10-11 LAB — LIPID PANEL
CHOL/HDL RATIO: 4.5 ratio
Cholesterol: 198 mg/dL (ref 0–200)
HDL: 44 mg/dL (ref >39)
LDL CALC: 121 mg/dL — AB (ref 0–99)
TRIGLYCERIDES: 165 mg/dL — AB (ref <150)
VLDL: 33 mg/dL (ref 0–40)

## 2013-10-11 LAB — HEPATIC FUNCTION PANEL
ALT: 23 U/L (ref 0–53)
AST: 23 U/L (ref 0–37)
Albumin: 4.3 g/dL (ref 3.5–5.2)
Alkaline Phosphatase: 54 U/L (ref 39–117)
BILIRUBIN DIRECT: 0.1 mg/dL (ref 0.0–0.3)
BILIRUBIN INDIRECT: 0.3 mg/dL (ref 0.2–1.2)
Total Bilirubin: 0.4 mg/dL (ref 0.2–1.2)
Total Protein: 6.9 g/dL (ref 6.0–8.3)

## 2013-10-11 LAB — BASIC METABOLIC PANEL WITH GFR
BUN: 18 mg/dL (ref 6–23)
CO2: 27 meq/L (ref 19–32)
Calcium: 8.9 mg/dL (ref 8.4–10.5)
Chloride: 102 mEq/L (ref 96–112)
Creat: 1 mg/dL (ref 0.50–1.35)
GFR, Est African American: >89 mL/min
GFR, Est Non African American: 85 mL/min
Glucose, Bld: 88 mg/dL (ref 70–99)
POTASSIUM: 4.2 meq/L (ref 3.5–5.3)
SODIUM: 140 meq/L (ref 135–145)

## 2013-10-11 LAB — VITAMIN D 25 HYDROXY (VIT D DEFICIENCY, FRACTURES): VIT D 25 HYDROXY: 89 ng/mL (ref 30–89)

## 2013-10-11 LAB — MAGNESIUM: MAGNESIUM: 2 mg/dL (ref 1.5–2.5)

## 2013-10-11 LAB — TSH: TSH: 3.579 u[IU]/mL (ref 0.350–4.500)

## 2013-10-11 MED ORDER — TESTOSTERONE CYPIONATE 200 MG/ML IM SOLN: 200.0000 mg | INTRAMUSCULAR | Status: DC

## 2013-12-26 ENCOUNTER — Other Ambulatory Visit: Payer: Self-pay | Admitting: Internal Medicine

## 2014-03-02 ENCOUNTER — Other Ambulatory Visit: Payer: Self-pay | Admitting: Internal Medicine

## 2014-04-22 ENCOUNTER — Encounter: Payer: Self-pay | Admitting: Internal Medicine

## 2014-04-22 ENCOUNTER — Ambulatory Visit (INDEPENDENT_AMBULATORY_CARE_PROVIDER_SITE_OTHER): Payer: 59 | Admitting: Internal Medicine

## 2014-04-22 VITALS — BP 122/78 | HR 72 | Temp 98.1°F | Resp 16 | Ht 73.0 in | Wt 202.6 lb

## 2014-04-22 DIAGNOSIS — Z23 Encounter for immunization: Secondary | ICD-10-CM

## 2014-04-22 DIAGNOSIS — Z0000 Encounter for general adult medical examination without abnormal findings: Secondary | ICD-10-CM

## 2014-04-22 DIAGNOSIS — R7303 Prediabetes: Secondary | ICD-10-CM

## 2014-04-22 DIAGNOSIS — R7402 Elevation of levels of lactic acid dehydrogenase (LDH): Secondary | ICD-10-CM

## 2014-04-22 DIAGNOSIS — Z1212 Encounter for screening for malignant neoplasm of rectum: Secondary | ICD-10-CM

## 2014-04-22 DIAGNOSIS — Z79899 Other long term (current) drug therapy: Secondary | ICD-10-CM | POA: Insufficient documentation

## 2014-04-22 DIAGNOSIS — R740 Nonspecific elevation of levels of transaminase and lactic acid dehydrogenase [LDH]: Secondary | ICD-10-CM

## 2014-04-22 DIAGNOSIS — Z125 Encounter for screening for malignant neoplasm of prostate: Secondary | ICD-10-CM

## 2014-04-22 DIAGNOSIS — I10 Essential (primary) hypertension: Secondary | ICD-10-CM

## 2014-04-22 DIAGNOSIS — R7309 Other abnormal glucose: Secondary | ICD-10-CM

## 2014-04-22 DIAGNOSIS — K219 Gastro-esophageal reflux disease without esophagitis: Secondary | ICD-10-CM

## 2014-04-22 DIAGNOSIS — Z111 Encounter for screening for respiratory tuberculosis: Secondary | ICD-10-CM

## 2014-04-22 DIAGNOSIS — E785 Hyperlipidemia, unspecified: Secondary | ICD-10-CM

## 2014-04-22 DIAGNOSIS — E559 Vitamin D deficiency, unspecified: Secondary | ICD-10-CM

## 2014-04-22 DIAGNOSIS — R7401 Elevation of levels of liver transaminase levels: Secondary | ICD-10-CM

## 2014-04-22 DIAGNOSIS — Z113 Encounter for screening for infections with a predominantly sexual mode of transmission: Secondary | ICD-10-CM

## 2014-04-22 DIAGNOSIS — E291 Testicular hypofunction: Secondary | ICD-10-CM

## 2014-04-22 NOTE — Progress Notes (Signed)
Patient ID: Jack Russell, male   DOB: [birth year], 53 y.o.   MRN: [id number]  Annual Screening/Preventative  Comprehensive Examination  This very nice 53 y.o.MWM presents for complete physical.  Patient has been followed for HTN,  Prediabetes, Hyperlipidemia, Testosterone and Vitamin D Deficiency.   HTN predates since 2008. Patient's BP has been controlled at home.Today's BP: 122/78 mmHg. Patient doe s have hx/o infrequent irregular palpitations for which he takes Diltiazem on a prn basis. He has been resilient to taking regular medications due to the infrequency of episodes.  Patient denies any cardiac symptoms as chest pain,  shortness of breath, dizziness or ankle swelling.   Patient's hyperlipidemia is not controlled with diet. Patient denies myalgias or other medication SE's. Last lipids were Total Chol 198; HDL 44; LDL 121*; Trig 165* not at goal on 10/10/2013.   Patient is screened for prediabetes and patient denies reactive hypoglycemic symptoms, visual blurring, diabetic polys or paresthesias. Last A1c was 5.4% in Sept 2014. Marland Kitchen     Patient is on testosterone replacement Therapy with improved sense of well-being & stamina.Finally, patient has history of Vitamin D Deficiency of 17 in 2008  and last vitamin D was 89 on 10/10/2013.  Medication Sig  . VITAMIN C) 1000 MG  Take 1,000 mg by mouth daily.  Marland Kitchen aspirin 81 MG tablet Take  daily.  . B-D 3CC LUER-LOK SYR 23GX1-1/2 23G X 1-1/2" 3 ML MISC USE AS DIRECTED  . VITAMIN D) 1000 U Take 1,000 Units by mouth daily.  . cyclobenzaprine  10 MG tab Take 1 tab every 8  hours as needed  . Diltiazem CD)180 MG 24 hr  Take 180 mg 2 times daily.  . fish oil 1000 MG capsule Take 2 g by mouth daily.  Jack Russell 5-325 MG  Take 1 tablet by mouth every 6 (six) hours as needed for pain.  . meloxicam 15 MG tablet TAKE 1 TABLET EVERY DAY AS NEEDED PAIN/INFLAMMATION  . ranitidine (ZANTAC) 150 MG tablet Take 150 mg by mouth every other day.  . ranitidine (ZANTAC) 300  MG tablet TAKE 1 TAB 1-2 x  DAILY AS NEEDED   .  DEPOTESTOTERONE 200 MG/ML i INJECT  2ML IM EVERY 2 WEEKS   No Known Allergies  Past Medical History  Diagnosis Date  . Arthritis   . GERD (gastroesophageal reflux disease)   . Hypertension   . Other testicular hypofunction   . Prediabetes   . Hyperlipidemia    Past Surgical History  Procedure Laterality Date  . Knee arthroscopy w/ meniscal repair  2009    right  . Colonoscopy  04/2009  . Proximal interphalangeal fusion (pip) Left 05/14/2013    Procedure: RECONSTRUCTION BOUTONNIERE (LEFT SMALL FINGER) PINNING PROXIMAL INTERPHALANGEAL FUSION (PIP);  Surgeon: Wynonia Sours, MD;  Location: [facility];  Service: Orthopedics;  Laterality: Left;   Family History  Problem Relation Age of Onset  . Transient ischemic attack Mother   . Stroke Father   . Heart disease Father   . Hypertension Father   . Cancer Maternal Grandmother     colon    History   Social History  . Marital Status: Married x 32 yrs    Spouse Name: N/A    Number of Children:  2 sons 32 & 69 yo  . Years of Education: N/A   Occupational History  . 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 .   Social History Main Topics  . Smoking  status: Never Smoker   . Smokeless tobacco: Not on file  . Alcohol Use: No  . Drug Use: No  . Sexual Activity: Active      ROS Constitutional: Denies fever, chills, weight loss/gain, headaches, insomnia, fatigue, night sweats or change in appetite. Eyes: Denies redness, blurred vision, diplopia, discharge, itchy or watery eyes.  ENT: Denies discharge, congestion, post nasal drip, epistaxis, sore throat, earache, hearing loss, dental pain, Tinnitus, Vertigo, Sinus pain or snoring.  Cardio: Denies chest pain, palpitations, irregular heartbeat, syncope, dyspnea, diaphoresis, orthopnea, PND, claudication or edema Respiratory: denies cough, dyspnea, DOE, pleurisy, hoarseness, laryngitis or wheezing.  Gastrointestinal: Denies  dysphagia, heartburn, reflux, water brash, pain, cramps, nausea, vomiting, bloating, diarrhea, constipation, hematemesis, melena, hematochezia, jaundice or hemorrhoids Genitourinary: Denies dysuria, frequency, urgency, nocturia, hesitancy, discharge, hematuria or flank pain Musculoskeletal: Denies arthralgia, myalgia, stiffness, Jt. Swelling, pain, limp or strain/sprain. Denies Falls. Skin: Denies puritis, rash, hives, warts, acne, eczema or change in skin lesion Neuro: No weakness, tremor, incoordination, spasms, paresthesia or pain Psychiatric: Denies confusion, memory loss or sensory loss. Denies Depression. Endocrine: Denies change in weight, skin, hair change, nocturia, and paresthesia, diabetic polys, visual blurring or hyper / hypo glycemic episodes.  Heme/Lymph: No excessive bleeding, bruising or enlarged lymph nodes.  Physical Exam  BP 122/78  Pulse 72  Temp(Src) 98.1 F (36.7 C) (Temporal)  Resp 16  Ht 6\' 1"  (1.854 m)  Wt 202 lb 9.6 oz (91.899 kg)  BMI 26.74 kg/m2  General Appearance: Well nourished, in no apparent distress. Eyes: PERRLA, EOMs, conjunctiva no swelling or erythema, normal fundi and vessels. Sinuses: No frontal/maxillary tenderness ENT/Mouth: EACs patent / TMs  nl. Nares clear without erythema, swelling, mucoid exudates. Oral hygiene is good. No erythema, swelling, or exudate. Tongue normal, non-obstructing. Tonsils not swollen or erythematous. Hearing normal.  Neck: Supple, thyroid normal. No bruits, nodes or JVD. Respiratory: Respiratory effort normal.  BS equal and clear bilateral without rales, rhonci, wheezing or stridor. Cardio: Heart sounds are normal with regular rate and rhythm and no murmurs, rubs or gallops. Peripheral pulses are normal and equal bilaterally without edema. No aortic or femoral bruits. Chest: symmetric with normal excursions and percussion.  Abdomen: Flat, soft, with bowl sounds. Nontender, no guarding, rebound, hernias, masses, or  organomegaly.  Lymphatics: Non tender without lymphadenopathy.  Genitourinary: No hernias.Testes nl. DRE - prostate nl for age - smooth & firm w/o nodules. Musculoskeletal: Full ROM all peripheral extremities, joint stability, 5/5 strength, and normal gait. Skin: Warm and dry without rashes, lesions, cyanosis, clubbing or  ecchymosis.  Neuro: Cranial nerves intact, reflexes equal bilaterally. Normal muscle tone, no cerebellar symptoms. Sensation intact.  Pysch: Awake and oriented X 3with normal affect, insight and judgment appropriate.   Assessment and Plan  1. Annual Screening Examination 2. Hypertension  3. Hyperlipidemia 4. Pre Diabetes, screening 5. Vitamin D Deficiency 6. Testosterone Deficiency   Continue prudent diet as discussed, weight control, BP monitoring, regular exercise, and medications as discussed.  Discussed med effects and SE's. Routine screening labs and tests as requested with regular follow-up as recommended.

## 2014-04-22 NOTE — Patient Instructions (Signed)
 Recommend the book "The END of DIETING" by Dr Joel Furman   and the book "The END of DIABETES " by Dr Joel Fuhrman  At [employer] - get book & Audio CD's      Being diabetic has a  300% increased risk for heart attack, stroke, cancer, and alzheimer- type vascular dementia. It is very important that you work harder with diet by avoiding all foods that are white except chicken & fish. Avoid white rice (brown & wild rice is OK), white potatoes (sweetpotatoes in moderation is OK), White bread or wheat bread or anything made out of white flour like bagels, donuts, rolls, buns, biscuits, cakes, pastries, cookies, pizza crust, and pasta (made from white flour & egg whites) - vegetarian pasta or spinach or wheat pasta is OK. Multigrain breads like Arnold's or [employer], or multigrain sandwich thins or flatbreads.  Diet, exercise and weight loss can reverse and cure diabetes in the early stages.  Diet, exercise and weight loss is very important in the control and prevention of complications of diabetes which affects every system in your body, ie. Brain - dementia/stroke, eyes - glaucoma/blindness, heart - heart attack/heart failure, kidneys - dialysis, stomach - gastric paralysis, intestines - malabsorption, nerves - severe painful neuritis, circulation - gangrene & loss of a leg(s), and finally cancer and Alzheimers.    I recommend avoid fried & greasy foods,  sweets/candy, white rice (brown or wild rice or Quinoa is OK), white potatoes (sweet potatoes are OK) - anything made from white flour - bagels, doughnuts, rolls, buns, biscuits,white and wheat breads, pizza crust and traditional pasta made of white flour & egg white(vegetarian pasta or spinach or wheat pasta is OK).  Multi-grain bread is OK - like multi-grain flat bread or sandwich thins. Avoid alcohol in excess. Exercise is also important.    Eat all the vegetables you want - avoid meat, especially red meat and dairy - especially cheese.  Cheese  is the most concentrated form of trans-fats which is the worst thing to clog up our arteries. Veggie cheese is OK which can be found in the fresh produce section at [employer] or [employer] or [employer]  Preventive Care for Adults A healthy lifestyle and preventive care can promote health and wellness. Preventive health guidelines for men include the following key practices:  A routine yearly physical is a good way to check with your health care provider about your health and preventative screening. It is a chance to share any concerns and updates on your health and to receive a thorough exam.  Visit your dentist for a routine exam and preventative care every 6 months. Brush your teeth twice a day and floss once a day. Good oral hygiene prevents tooth decay and gum disease.  The frequency of eye exams is based on your age, health, family medical history, use of contact lenses, and other factors. Follow your health care provider's recommendations for frequency of eye exams.  Eat a healthy diet. Foods such as vegetables, fruits, whole grains, low-fat dairy products, and lean protein foods contain the nutrients you need without too many calories. Decrease your intake of foods high in solid fats, added sugars, and salt. Eat the right amount of calories for you.Get information about a proper diet from your health care provider, if necessary.  Regular physical exercise is one of the most important things you can do for your health. Most adults should get at least 150 minutes of moderate-intensity exercise (any activity that   increases your heart rate and causes you to sweat) each week. In addition, most adults need muscle-strengthening exercises on 2 or more days a week.  Maintain a healthy weight. The body mass index (BMI) is a screening tool to identify possible weight problems. It provides an estimate of body fat based on height and weight. Your health care provider can find your BMI and can help you  achieve or maintain a healthy weight.For adults 20 years and older:  A BMI below 18.5 is considered underweight.  A BMI of 18.5 to 24.9 is normal.  A BMI of 25 to 29.9 is considered overweight.  A BMI of 30 and above is considered obese.  Maintain normal blood lipids and cholesterol levels by exercising and minimizing your intake of saturated fat. Eat a balanced diet with plenty of fruit and vegetables. Blood tests for lipids and cholesterol should begin at age 20 and be repeated every 5 years. If your lipid or cholesterol levels are high, you are over 50, or you are at high risk for heart disease, you may need your cholesterol levels checked more frequently.Ongoing high lipid and cholesterol levels should be treated with medicines if diet and exercise are not working.  If you smoke, find out from your health care provider how to quit. If you do not use tobacco, do not start.  Lung cancer screening is recommended for adults aged 55-80 years who are at high risk for developing lung cancer because of a history of smoking. A yearly low-dose CT scan of the lungs is recommended for people who have at least a 30-pack-year history of smoking and are a current smoker or have quit within the past 15 years. A pack year of smoking is smoking an average of 1 pack of cigarettes a day for 1 year (for example: 1 pack a day for 30 years or 2 packs a day for 15 years). Yearly screening should continue until the smoker has stopped smoking for at least 15 years. Yearly screening should be stopped for people who develop a health problem that would prevent them from having lung cancer treatment.  If you choose to drink alcohol, do not have more than 2 drinks per day. One drink is considered to be 12 ounces (355 mL) of beer, 5 ounces (148 mL) of wine, or 1.5 ounces (44 mL) of liquor.  Avoid use of street drugs. Do not share needles with anyone. Ask for help if you need support or instructions about stopping the use of  drugs.  High blood pressure causes heart disease and increases the risk of stroke. Your blood pressure should be checked at least every 1-2 years. Ongoing high blood pressure should be treated with medicines, if weight loss and exercise are not effective.  If you are 45-79 years old, ask your health care provider if you should take aspirin to prevent heart disease.  Diabetes screening involves taking a blood sample to check your fasting blood sugar level. This should be done once every 3 years, after age 45, if you are within normal weight and without risk factors for diabetes. Testing should be considered at a younger age or be carried out more frequently if you are overweight and have at least 1 risk factor for diabetes.  Colorectal cancer can be detected and often prevented. Most routine colorectal cancer screening begins at the age of 50 and continues through age 75. However, your health care provider may recommend screening at an earlier age if you have risk   factors for colon cancer. On a yearly basis, your health care provider may provide home test kits to check for hidden blood in the stool. Use of a small camera at the end of a tube to directly examine the colon (sigmoidoscopy or colonoscopy) can detect the earliest forms of colorectal cancer. Talk to your health care provider about this at age 50, when routine screening begins. Direct exam of the colon should be repeated every 5-10 years through age 75, unless early forms of precancerous polyps or small growths are found.  People who are at an increased risk for hepatitis B should be screened for this virus. You are considered at high risk for hepatitis B if:  You were born in a country where hepatitis B occurs often. Talk with your health care provider about which countries are considered high risk.  Your parents were born in a high-risk country and you have not received a shot to protect against hepatitis B (hepatitis B vaccine).  You have  HIV or AIDS.  You use needles to inject street drugs.  You live with, or have sex with, someone who has hepatitis B.  You are a man who has sex with other men (MSM).  You get hemodialysis treatment.  You take certain medicines for conditions such as cancer, organ transplantation, and autoimmune conditions.  Hepatitis C blood testing is recommended for all people born from 1945 through 1965 and any individual with known risks for hepatitis C.  Practice safe sex. Use condoms and avoid high-risk sexual practices to reduce the spread of sexually transmitted infections (STIs). STIs include gonorrhea, chlamydia, syphilis, trichomonas, herpes, HPV, and human immunodeficiency virus (HIV). Herpes, HIV, and HPV are viral illnesses that have no cure. They can result in disability, cancer, and death.  If you are at risk of being infected with HIV, it is recommended that you take a prescription medicine daily to prevent HIV infection. This is called preexposure prophylaxis (PrEP). You are considered at risk if:  You are a man who has sex with other men (MSM) and have other risk factors.  You are a heterosexual man, are sexually active, and are at increased risk for HIV infection.  You take drugs by injection.  You are sexually active with a partner who has HIV.  Talk with your health care provider about whether you are at high risk of being infected with HIV. If you choose to begin PrEP, you should first be tested for HIV. You should then be tested every 3 months for as long as you are taking PrEP.  A one-time screening for abdominal aortic aneurysm (AAA) and surgical repair of large AAAs by ultrasound are recommended for men ages 65 to 75 years who are current or former smokers.  Healthy men should no longer receive prostate-specific antigen (PSA) blood tests as part of routine cancer screening. Talk with your health care provider about prostate cancer screening.  Testicular cancer screening is  not recommended for adult males who have no symptoms. Screening includes self-exam, a health care provider exam, and other screening tests. Consult with your health care provider about any symptoms you have or any concerns you have about testicular cancer.  Use sunscreen. Apply sunscreen liberally and repeatedly throughout the day. You should seek shade when your shadow is shorter than you. Protect yourself by wearing long sleeves, pants, a wide-brimmed hat, and sunglasses year round, whenever you are outdoors.  Once a month, do a whole-body skin exam, using a mirror to look   at the skin on your back. Tell your health care provider about new moles, moles that have irregular borders, moles that are larger than a pencil eraser, or moles that have changed in shape or color.  Stay current with required vaccines (immunizations).  Influenza vaccine. All adults should be immunized every year.  Tetanus, diphtheria, and acellular pertussis (Td, Tdap) vaccine. An adult who has not previously received Tdap or who does not know his vaccine status should receive 1 dose of Tdap. This initial dose should be followed by tetanus and diphtheria toxoids (Td) booster doses every 10 years. Adults with an unknown or incomplete history of completing a 3-dose immunization series with Td-containing vaccines should begin or complete a primary immunization series including a Tdap dose. Adults should receive a Td booster every 10 years.  Varicella vaccine. An adult without evidence of immunity to varicella should receive 2 doses or a second dose if he has previously received 1 dose.  Human papillomavirus (HPV) vaccine. Males aged 13-21 years who have not received the vaccine previously should receive the 3-dose series. Males aged 22-26 years may be immunized. Immunization is recommended through the age of 26 years for any male who has sex with males and did not get any or all doses earlier. Immunization is recommended for any  person with an immunocompromised condition through the age of 26 years if he did not get any or all doses earlier. During the 3-dose series, the second dose should be obtained 4-8 weeks after the first dose. The third dose should be obtained 24 weeks after the first dose and 16 weeks after the second dose.  Zoster vaccine. One dose is recommended for adults aged 60 years or older unless certain conditions are present.  Measles, mumps, and rubella (MMR) vaccine. Adults born before 1957 generally are considered immune to measles and mumps. Adults born in 1957 or later should have 1 or more doses of MMR vaccine unless there is a contraindication to the vaccine or there is laboratory evidence of immunity to each of the three diseases. A routine second dose of MMR vaccine should be obtained at least 28 days after the first dose for students attending postsecondary schools, health care workers, or international travelers. People who received inactivated measles vaccine or an unknown type of measles vaccine during 1963-1967 should receive 2 doses of MMR vaccine. People who received inactivated mumps vaccine or an unknown type of mumps vaccine before 1979 and are at high risk for mumps infection should consider immunization with 2 doses of MMR vaccine. Unvaccinated health care workers born before 1957 who lack laboratory evidence of measles, mumps, or rubella immunity or laboratory confirmation of disease should consider measles and mumps immunization with 2 doses of MMR vaccine or rubella immunization with 1 dose of MMR vaccine.  Pneumococcal 13-valent conjugate (PCV13) vaccine. When indicated, a person who is uncertain of his immunization history and has no record of immunization should receive the PCV13 vaccine. An adult aged 19 years or older who has certain medical conditions and has not been previously immunized should receive 1 dose of PCV13 vaccine. This PCV13 should be followed with a dose of pneumococcal  polysaccharide (PPSV23) vaccine. The PPSV23 vaccine dose should be obtained at least 8 weeks after the dose of PCV13 vaccine. An adult aged 19 years or older who has certain medical conditions and previously received 1 or more doses of PPSV23 vaccine should receive 1 dose of PCV13. The PCV13 vaccine dose should be obtained 1   or more years after the last PPSV23 vaccine dose.  Pneumococcal polysaccharide (PPSV23) vaccine. When PCV13 is also indicated, PCV13 should be obtained first. All adults aged 65 years and older should be immunized. An adult younger than age 65 years who has certain medical conditions should be immunized. Any person who resides in a nursing home or long-term care facility should be immunized. An adult smoker should be immunized. People with an immunocompromised condition and certain other conditions should receive both PCV13 and PPSV23 vaccines. People with human immunodeficiency virus (HIV) infection should be immunized as soon as possible after diagnosis. Immunization during chemotherapy or radiation therapy should be avoided. Routine use of PPSV23 vaccine is not recommended for American Indians, [place] Natives, or people younger than 65 years unless there are medical conditions that require PPSV23 vaccine. When indicated, people who have unknown immunization and have no record of immunization should receive PPSV23 vaccine. One-time revaccination 5 years after the first dose of PPSV23 is recommended for people aged 19-64 years who have chronic kidney failure, nephrotic syndrome, asplenia, or immunocompromised conditions. People who received 1-2 doses of PPSV23 before age 65 years should receive another dose of PPSV23 vaccine at age 65 years or later if at least 5 years have passed since the previous dose. Doses of PPSV23 are not needed for people immunized with PPSV23 at or after age 65 years.  Meningococcal vaccine. Adults with asplenia or persistent complement component deficiencies  should receive 2 doses of quadrivalent meningococcal conjugate (MenACWY-D) vaccine. The doses should be obtained at least 2 months apart. Microbiologists working with certain meningococcal bacteria, military recruits, people at risk during an outbreak, and people who travel to or live in countries with a high rate of meningitis should be immunized. A first-year college student up through age 21 years who is living in a residence hall should receive a dose if he did not receive a dose on or after his 16th birthday. Adults who have certain high-risk conditions should receive one or more doses of vaccine.  Hepatitis A vaccine. Adults who wish to be protected from this disease, have certain high-risk conditions, work with hepatitis A-infected animals, work in hepatitis A research labs, or travel to or work in countries with a high rate of hepatitis A should be immunized. Adults who were previously unvaccinated and who anticipate close contact with an international adoptee during the first 60 days after arrival in the [place] from a country with a high rate of hepatitis A should be immunized.  Hepatitis B vaccine. Adults should be immunized if they wish to be protected from this disease, have certain high-risk conditions, may be exposed to blood or other infectious body fluids, are household contacts or sex partners of hepatitis B positive people, are clients or workers in certain care facilities, or travel to or work in countries with a high rate of hepatitis B.  Haemophilus influenzae type b (Hib) vaccine. A previously unvaccinated person with asplenia or sickle cell disease or having a scheduled splenectomy should receive 1 dose of Hib vaccine. Regardless of previous immunization, a recipient of a hematopoietic stem cell transplant should receive a 3-dose series 6-12 months after his successful transplant. Hib vaccine is not recommended for adults with HIV infection. Preventive Service / Frequency Ages  40 to 64  Blood pressure check.** / Every 1 to 2 years.  Lipid and cholesterol check.** / Every 5 years beginning at age 20.  Lung cancer screening. / Every year if you are aged 55-80   aged 1-80 years and have a 30-pack-year history of smoking and currently smoke or have quit within the past 15 years. Yearly screening is stopped once you have quit smoking for at least 15 years or develop a health problem that would prevent you from having lung cancer treatment.  Fecal occult blood test (FOBT) of stool. / Every year beginning at age 69 and continuing until age 60. You may not have to do this test if you get a colonoscopy every 10 years.  Flexible sigmoidoscopy** or colonoscopy.** / Every 5 years for a flexible sigmoidoscopy or every 10 years for a colonoscopy beginning at age 81 and continuing until age 27.  Abdominal aortic aneurysm (AAA) screening for persons with history of hypertension or elevated blood pressure or who are current or former smokers.  Hepatitis C blood test.** / For all people born from [birth year] through 1965 and any individual with known risks for hepatitis C.  Skin self-exam. / Monthly.  Influenza vaccine. / Every year.  Tetanus, diphtheria, and acellular pertussis (Tdap/Td) vaccine.** / Consult your health care provider. 1 dose of Td every 10 years.  Varicella vaccine.** / Consult your health care provider.  Zoster vaccine.** / 1 dose for adults aged 61 years or older.  Measles, mumps, rubella (MMR) vaccine.** / You need at least 1 dose of MMR if you were born in 1957 or later. You may also need a second dose.  Pneumococcal 13-valent conjugate (PCV13) vaccine.** / Consult your health care provider.  Pneumococcal polysaccharide (PPSV23) vaccine.** / 1 to 2 doses if you smoke cigarettes or if you have certain conditions.  Meningococcal vaccine.** / Consult your health care provider.  Hepatitis A vaccine.** / Consult your health care provider.  Hepatitis B  vaccine.** / Consult your health care provider.  Haemophilus influenzae type b (Hib) vaccine.** / Consult your health care provider.

## 2014-04-23 LAB — BASIC METABOLIC PANEL WITH GFR
BUN: 19 mg/dL (ref 6–23)
CALCIUM: 9.2 mg/dL (ref 8.4–10.5)
CO2: 29 mEq/L (ref 19–32)
Chloride: 100 mEq/L (ref 96–112)
Creat: 1.07 mg/dL (ref 0.50–1.35)
GFR, Est African American: >89 mL/min
GFR, Est Non African American: 78 mL/min
GLUCOSE: 112 mg/dL — AB (ref 70–99)
Potassium: 4.1 mEq/L (ref 3.5–5.3)
Sodium: 138 mEq/L (ref 135–145)

## 2014-04-23 LAB — CBC WITH DIFFERENTIAL/PLATELET
BASOS PCT: 2 % — AB (ref 0–1)
Basophils Absolute: 0.1 10*3/uL (ref 0.0–0.1)
EOS PCT: 7 % — AB (ref 0–5)
Eosinophils Absolute: 0.4 10*3/uL (ref 0.0–0.7)
HCT: 46.4 % (ref 39.0–52.0)
Hemoglobin: 16.3 g/dL (ref 13.0–17.0)
Lymphocytes Relative: 32 % (ref 12–46)
Lymphs Abs: 1.6 10*3/uL (ref 0.7–4.0)
MCH: 30.8 pg (ref 26.0–34.0)
MCHC: 35.1 g/dL (ref 30.0–36.0)
MCV: 87.7 fL (ref 78.0–100.0)
MONO ABS: 0.3 10*3/uL (ref 0.1–1.0)
MONOS PCT: 6 % (ref 3–12)
NEUTROS PCT: 53 % (ref 43–77)
Neutro Abs: 2.7 10*3/uL (ref 1.7–7.7)
Platelets: 233 10*3/uL (ref 150–400)
RBC: 5.29 MIL/uL (ref 4.22–5.81)
RDW: 13.5 % (ref 11.5–15.5)
WBC: 5.1 10*3/uL (ref 4.0–10.5)

## 2014-04-23 LAB — HEPATIC FUNCTION PANEL
ALT: 37 U/L (ref 0–53)
AST: 29 U/L (ref 0–37)
Albumin: 4.4 g/dL (ref 3.5–5.2)
Alkaline Phosphatase: 52 U/L (ref 39–117)
Bilirubin, Direct: 0.1 mg/dL (ref 0.0–0.3)
Indirect Bilirubin: 0.5 mg/dL (ref 0.2–1.2)
TOTAL PROTEIN: 7.1 g/dL (ref 6.0–8.3)
Total Bilirubin: 0.6 mg/dL (ref 0.2–1.2)

## 2014-04-23 LAB — URINALYSIS, MICROSCOPIC ONLY
Bacteria, UA: NONE SEEN
Casts: NONE SEEN
Crystals: NONE SEEN
Squamous Epithelial / LPF: NONE SEEN

## 2014-04-23 LAB — LIPID PANEL
CHOL/HDL RATIO: 4.8 ratio
CHOLESTEROL: 222 mg/dL — AB (ref 0–200)
HDL: 46 mg/dL (ref >39)
LDL Cholesterol: 137 mg/dL — ABNORMAL HIGH (ref 0–99)
Triglycerides: 193 mg/dL — ABNORMAL HIGH (ref <150)
VLDL: 39 mg/dL (ref 0–40)

## 2014-04-23 LAB — PSA: PSA: 0.89 ng/mL (ref <=4.00)

## 2014-04-23 LAB — HEMOGLOBIN A1C
HEMOGLOBIN A1C: 5.5 % (ref <5.7)
MEAN PLASMA GLUCOSE: 111 mg/dL (ref <117)

## 2014-04-23 LAB — VITAMIN D 25 HYDROXY (VIT D DEFICIENCY, FRACTURES): Vit D, 25-Hydroxy: 111 ng/mL — ABNORMAL HIGH (ref 30–89)

## 2014-04-23 LAB — VITAMIN B12: Vitamin B-12: 1212 pg/mL — ABNORMAL HIGH (ref 211–911)

## 2014-04-23 LAB — MICROALBUMIN / CREATININE URINE RATIO
CREATININE, URINE: 42.3 mg/dL
MICROALB UR: 0.2 mg/dL (ref <2.0)
Microalb Creat Ratio: 4.7 mg/g (ref 0.0–30.0)

## 2014-04-23 LAB — MAGNESIUM: MAGNESIUM: 1.9 mg/dL (ref 1.5–2.5)

## 2014-04-23 LAB — TESTOSTERONE: Testosterone: 236 ng/dL — ABNORMAL LOW (ref 300–890)

## 2014-04-23 LAB — INSULIN, FASTING: Insulin fasting, serum: 35.5 u[IU]/mL — ABNORMAL HIGH (ref 2.0–19.6)

## 2014-04-23 LAB — TSH: TSH: 3.793 u[IU]/mL (ref 0.350–4.500)

## 2014-04-25 LAB — TB SKIN TEST
Induration: 0 mm
TB SKIN TEST: NEGATIVE

## 2014-06-15 ENCOUNTER — Other Ambulatory Visit: Payer: Self-pay | Admitting: Physician Assistant

## 2014-06-16 ENCOUNTER — Other Ambulatory Visit: Payer: Self-pay | Admitting: *Deleted

## 2014-06-16 MED ORDER — MELOXICAM 15 MG PO TABS: ORAL_TABLET | ORAL | Status: DC

## 2014-07-10 ENCOUNTER — Ambulatory Visit (INDEPENDENT_AMBULATORY_CARE_PROVIDER_SITE_OTHER): Payer: 59 | Admitting: Physician Assistant

## 2014-07-10 ENCOUNTER — Encounter: Payer: Self-pay | Admitting: Physician Assistant

## 2014-07-10 VITALS — BP 144/92 | HR 70 | Temp 98.2°F | Resp 18 | Ht 73.0 in | Wt 203.0 lb

## 2014-07-10 DIAGNOSIS — Z79899 Other long term (current) drug therapy: Secondary | ICD-10-CM

## 2014-07-10 DIAGNOSIS — K219 Gastro-esophageal reflux disease without esophagitis: Secondary | ICD-10-CM

## 2014-07-10 DIAGNOSIS — R1084 Generalized abdominal pain: Secondary | ICD-10-CM

## 2014-07-10 LAB — BASIC METABOLIC PANEL WITH GFR
BUN: 21 mg/dL (ref 6–23)
CALCIUM: 9.2 mg/dL (ref 8.4–10.5)
CO2: 25 meq/L (ref 19–32)
Chloride: 103 mEq/L (ref 96–112)
Creat: 1.11 mg/dL (ref 0.50–1.35)
GFR, Est African American: 86 mL/min
GFR, Est Non African American: 74 mL/min
Glucose, Bld: 88 mg/dL (ref 70–99)
Potassium: 4.4 mEq/L (ref 3.5–5.3)
SODIUM: 138 meq/L (ref 135–145)

## 2014-07-10 LAB — HEPATIC FUNCTION PANEL
ALT: 25 U/L (ref 0–53)
AST: 24 U/L (ref 0–37)
Albumin: 4.2 g/dL (ref 3.5–5.2)
Alkaline Phosphatase: 51 U/L (ref 39–117)
BILIRUBIN DIRECT: 0.1 mg/dL (ref 0.0–0.3)
BILIRUBIN INDIRECT: 0.3 mg/dL (ref 0.2–1.2)
Total Bilirubin: 0.4 mg/dL (ref 0.2–1.2)
Total Protein: 7 g/dL (ref 6.0–8.3)

## 2014-07-10 LAB — AMYLASE: Amylase: 41 U/L (ref 0–105)

## 2014-07-10 LAB — LIPASE: Lipase: 34 U/L (ref 0–75)

## 2014-07-10 MED ORDER — DEXLANSOPRAZOLE 60 MG PO CPDR: 60.0000 mg | DELAYED_RELEASE_CAPSULE | Freq: Every day | ORAL | Status: DC

## 2014-07-10 NOTE — Patient Instructions (Addendum)
- Continue Zantac as prescribed. -Start taking Dexilant as prescribed. Do Not take Advil, Aleve, Ibuprofen, Naproxen while taking Mobic. Can take Tylenol -I will message you in MyChart with results.  If pancreas enzymes come back elevated, then we will do ultrasound.  If you are having more abdominal pain, nausea, vomiting, fever, chills, sweats, diarrhea, constipation, blood in stool/vomit, coughing up blood, then go to ED immediately.  Please follow up in one month.   Peptic Ulcer A peptic ulcer is a sore in the lining of your esophagus (esophageal ulcer), stomach (gastric ulcer), or in the first part of your small intestine (duodenal ulcer). The ulcer causes erosion into the deeper tissue. CAUSES  Normally, the lining of the stomach and the small intestine protects itself from the acid that digests food. The protective lining can be damaged by:  An infection caused by a bacterium called Helicobacter pylori (H. pylori).  Regular use of nonsteroidal anti-inflammatory drugs (NSAIDs), such as ibuprofen or aspirin.  Smoking tobacco. Other risk factors include being older than 51, drinking alcohol excessively, and having a family history of ulcer disease.  SYMPTOMS   Burning pain or gnawing in the area between the chest and the belly button.  Heartburn.  Nausea and vomiting.  Bloating. The pain can be worse on an empty stomach and at night. If the ulcer results in bleeding, it can cause:  Black, tarry stools.  Vomiting of bright red blood.  Vomiting of coffee-ground-looking materials. DIAGNOSIS  A diagnosis is usually made based upon your history and an exam. Other tests and procedures may be performed to find the cause of the ulcer. Finding a cause will help determine the best treatment. Tests and procedures may include:  Blood tests, stool tests, or breath tests to check for the bacterium H. pylori.  An upper gastrointestinal (GI) series of the esophagus, stomach, and  small intestine.  An endoscopy to examine the esophagus, stomach, and small intestine.  A biopsy. TREATMENT  Treatment may include:  Eliminating the cause of the ulcer, such as smoking, NSAIDs, or alcohol.  Medicines to reduce the amount of acid in your digestive tract.  Antibiotic medicines if the ulcer is caused by the H. pylori bacterium.  An upper endoscopy to treat a bleeding ulcer.  Surgery if the bleeding is severe or if the ulcer created a hole somewhere in the digestive system. HOME CARE INSTRUCTIONS   Avoid tobacco, alcohol, and caffeine. Smoking can increase the acid in the stomach, and continued smoking will impair the healing of ulcers.  Avoid foods and drinks that seem to cause discomfort or aggravate your ulcer.  Only take medicines as directed by your caregiver. Do not substitute over-the-counter medicines for prescription medicines without talking to your caregiver.  Keep any follow-up appointments and tests as directed. SEEK MEDICAL CARE IF:   Your do not improve within 7 days of starting treatment.  You have ongoing indigestion or heartburn. SEEK IMMEDIATE MEDICAL CARE IF:   You have sudden, sharp, or persistent abdominal pain.  You have bloody or dark black, tarry stools.  You vomit blood or vomit that looks like coffee grounds.  You become light-headed, weak, or feel faint.  You become sweaty or clammy. MAKE SURE YOU:   Understand these instructions.  Will watch your condition.  Will get help right away if you are not doing well or get worse. Document Released: 07/08/2000 Document Revised: 11/25/2013 Document Reviewed: 02/08/2012 [facility] Patient Information 2015 [place], [place]. This information is not intended  to replace advice given to you by your health care provider. Make sure you discuss any questions you have with your health care provider.  

## 2014-07-10 NOTE — Progress Notes (Signed)
Subjective:    Patient ID: Jack Russell, male    DOB: 01/06/1961, 53 y.o.   MRN: [id number]  Abdominal Pain This is a new problem. Episode onset: started 3 weeks ago, but got worse 1 week ago. The onset quality is gradual. The problem occurs intermittently. The problem has been waxing and waning (4pm in evening and then feels again at bedtime). The pain is located in the RUQ and epigastric region. The pain is at a severity of 7/10. The quality of the pain is cramping. The abdominal pain does not radiate. Pertinent negatives include no constipation, diarrhea, nausea or vomiting. Exacerbated by: Pain is worse when there is no food in stomach. The pain is relieved by eating. His past medical history is significant for GERD.  Patient is on Mobic.  He did start taking Advil and Tylenol while on Mobic.  However, he states the pain started before he started the Advil with the Mobic.  He states his last BM was this morning and the stool was soft but formed. GFR= 78 on 04/22/14 Review of Systems  Constitutional: Negative.   HENT: Negative.   Eyes: Negative.   Respiratory: Negative.   Cardiovascular: Negative.   Gastrointestinal: Positive for abdominal pain. Negative for nausea, vomiting, diarrhea, constipation, blood in stool and anal bleeding.       Patient states he did have blood on toilet paper once, but it has resolved.  Genitourinary: Negative.   Skin: Negative.   Neurological: Negative.   Psychiatric/Behavioral: The patient is nervous/anxious.    Past Medical History  Diagnosis Date  . Arthritis   . GERD (gastroesophageal reflux disease)   . Hypertension   . Other testicular hypofunction   . Prediabetes   . Hyperlipidemia    Current Outpatient Prescriptions on File Prior to Visit  Medication Sig Dispense Refill  . Ascorbic Acid (VITAMIN C) 1000 MG tablet Take 1,000 mg by mouth daily.    Marland Kitchen aspirin 81 MG tablet Take 81 mg by mouth daily.    . B-D 3CC LUER-LOK SYR 23GX1-1/2 23G X 1-1/2"  3 ML MISC USE AS DIRECTED 12 each 1  . cholecalciferol (VITAMIN D) 1000 UNITS tablet Take 1,000 Units by mouth daily.    . fish oil-omega-3 fatty acids 1000 MG capsule Take 2 g by mouth daily.    . meloxicam (MOBIC) 15 MG tablet TAKE 1 TABLET EVERY DAY AS NEEDED PAIN/INFLAMMATION 90 tablet 1  . ranitidine (ZANTAC) 300 MG tablet TAKE 1 TABLET BY MOUTH ONCE TO TWICE DAILY AS NEEDED FOR INDIGESTION 180 tablet 99  . testosterone cypionate (DEPOTESTOTERONE CYPIONATE) 200 MG/ML injection INJECT UP TO 2ML IM EVERY 2 WEEKS 10 mL 1  . diltiazem (CARDIZEM CD) 240 MG 24 hr capsule TAKE ONE CAPSULE BY MOUTH EVERY DAY (Patient not taking: Reported on 07/10/2014) 90 capsule 99   No current facility-administered medications on file prior to visit.   No Known Allergies    BP 144/92 mmHg  Pulse 70  Temp(Src) 98.2 F (36.8 C) (Temporal)  Resp 18  Ht 6\' 1"  (1.854 m)  Wt 203 lb (92.08 kg)  BMI 26.79 kg/m2 Wt Readings from Last 3 Encounters:  07/10/14 203 lb (92.08 kg)  04/22/14 202 lb 9.6 oz (91.899 kg)  10/10/13 202 lb (91.627 kg)   Objective:   Physical Exam  Constitutional: He is oriented to person, place, and time. He appears well-developed and well-nourished. He does not have a sickly appearance. No distress.  HENT:  Head: Normocephalic.  Right Ear: Tympanic membrane, external ear and ear canal normal.  Left Ear: Tympanic membrane, external ear and ear canal normal.  Nose: Nose normal. Right sinus exhibits no maxillary sinus tenderness and no frontal sinus tenderness. Left sinus exhibits no maxillary sinus tenderness and no frontal sinus tenderness.  Mouth/Throat: Uvula is midline, oropharynx is clear and moist and mucous membranes are normal. Mucous membranes are not pale and not dry. No trismus in the jaw. No uvula swelling. No oropharyngeal exudate, posterior oropharyngeal edema, posterior oropharyngeal erythema or tonsillar abscesses.  Patient just had dental procedure today for cavity repair.   Eyes: Conjunctivae and lids are normal. Pupils are equal, round, and reactive to light. Right eye exhibits no discharge. Left eye exhibits no discharge. No scleral icterus.  Neck: Trachea normal, normal range of motion and phonation normal. Neck supple. No tracheal tenderness present. No tracheal deviation present.  Cardiovascular: Normal rate, regular rhythm, S1 normal, S2 normal, normal heart sounds, intact distal pulses and normal pulses.  Exam reveals no gallop and no friction rub.   No murmur heard. Pulmonary/Chest: Effort normal and breath sounds normal. No stridor. No respiratory distress. He has no decreased breath sounds. He has no wheezes. He has no rhonchi. He has no rales. He exhibits no tenderness.  Abdominal: Soft. Normal appearance and bowel sounds are normal. He exhibits no distension, no abdominal bruit, no pulsatile midline mass and no mass. There is no hepatosplenomegaly. There is tenderness in the right upper quadrant and epigastric area. There is no rebound, no guarding and negative Murphy's sign. No hernia.  Lymphadenopathy:  No tenderness or LAD.  Neurological: He is alert and oriented to person, place, and time. Gait normal.  Skin: Skin is warm, dry and intact. No rash noted. He is not diaphoretic. No pallor.  Psychiatric: He has a normal mood and affect. His speech is normal and behavior is normal. Judgment and thought content normal. Cognition and memory are normal.  Vitals reviewed.  Assessment & Plan:  1. Generalized abdominal pain- Possible gastric or duodenal Ulcer? - Continue Zantac as prescribed -Start taking Dexilant as prescribed.  Gave 3 boxes with 5 tablets each for total of 15 tablets- dexlansoprazole (DEXILANT) 60 MG capsule; Take 1 capsule (60 mg total) by mouth daily.  Dispense: 30 capsule; Refill: 0 Ordered labs to check for infection and cause of abdominal pain. - CBC with Differential - BASIC METABOLIC PANEL WITH GFR - Hepatic function panel -  Amylase - Lipase - Helicobacter pylori abs-IgG+IgA,   2. Gastroesophageal reflux disease, esophagitis presence not specified Continue Zantac as prescribed. Start taking Dexilant as prescribed.  3. Encounter for long-term (current) use of medications Will monitor kidney and liver function. - CBC with Differential - BASIC METABOLIC PANEL WITH GFR - Hepatic function panel  Discussed medication effects and SE's.  Pt agreed to treatment plan. Please follow up in 1 month  Jaleeya Mcnelly, Stephani Police, PA-C 3:04 PM [facility]

## 2014-07-11 ENCOUNTER — Other Ambulatory Visit: Payer: Self-pay | Admitting: Physician Assistant

## 2014-07-11 ENCOUNTER — Encounter: Payer: Self-pay | Admitting: Internal Medicine

## 2014-07-11 DIAGNOSIS — R768 Other specified abnormal immunological findings in serum: Secondary | ICD-10-CM

## 2014-07-11 LAB — CBC WITH DIFFERENTIAL/PLATELET
Basophils Absolute: 0.1 10*3/uL (ref 0.0–0.1)
Basophils Relative: 1 % (ref 0–1)
Eosinophils Absolute: 0.5 10*3/uL (ref 0.0–0.7)
Eosinophils Relative: 8 % — ABNORMAL HIGH (ref 0–5)
HCT: 44.5 % (ref 39.0–52.0)
HEMOGLOBIN: 15.3 g/dL (ref 13.0–17.0)
Lymphocytes Relative: 28 % (ref 12–46)
Lymphs Abs: 1.7 10*3/uL (ref 0.7–4.0)
MCH: 30 pg (ref 26.0–34.0)
MCHC: 34.4 g/dL (ref 30.0–36.0)
MCV: 87.3 fL (ref 78.0–100.0)
MPV: 10.3 fL (ref 9.4–12.4)
Monocytes Absolute: 0.4 10*3/uL (ref 0.1–1.0)
Monocytes Relative: 7 % (ref 3–12)
NEUTROS PCT: 56 % (ref 43–77)
Neutro Abs: 3.5 10*3/uL (ref 1.7–7.7)
Platelets: 292 10*3/uL (ref 150–400)
RBC: 5.1 MIL/uL (ref 4.22–5.81)
RDW: 13.4 % (ref 11.5–15.5)
WBC: 6.2 10*3/uL (ref 4.0–10.5)

## 2014-07-11 LAB — HELICOBACTER PYLORI ABS-IGG+IGA, BLD
H Pylori IgG: 0.42 {ISR}
HELICOBACTER PYLORI AB, IGA: 16.1 U/mL — ABNORMAL HIGH (ref <9.0)

## 2014-07-11 MED ORDER — AMOXICILLIN 500 MG PO TABS: 500.0000 mg | ORAL_TABLET | Freq: Four times a day (QID) | ORAL | Status: DC

## 2014-07-11 MED ORDER — TETRACYCLINE HCL 500 MG PO CAPS: 500.0000 mg | ORAL_CAPSULE | Freq: Four times a day (QID) | ORAL | Status: DC

## 2014-07-14 ENCOUNTER — Other Ambulatory Visit: Payer: Self-pay | Admitting: Physician Assistant

## 2014-07-14 ENCOUNTER — Other Ambulatory Visit: Payer: Self-pay | Admitting: Emergency Medicine

## 2014-07-14 MED ORDER — DOXYCYCLINE HYCLATE 100 MG PO TABS: 100.0000 mg | ORAL_TABLET | Freq: Two times a day (BID) | ORAL | Status: AC

## 2014-07-14 NOTE — Progress Notes (Signed)
Patient aware.

## 2014-08-04 ENCOUNTER — Ambulatory Visit (INDEPENDENT_AMBULATORY_CARE_PROVIDER_SITE_OTHER): Payer: 59 | Admitting: Physician Assistant

## 2014-08-04 ENCOUNTER — Other Ambulatory Visit: Payer: Self-pay | Admitting: Physician Assistant

## 2014-08-04 ENCOUNTER — Encounter: Payer: Self-pay | Admitting: Physician Assistant

## 2014-08-04 VITALS — BP 136/82 | HR 80 | Temp 98.2°F | Resp 16 | Ht 73.0 in | Wt 206.0 lb

## 2014-08-04 DIAGNOSIS — B9681 Helicobacter pylori [H. pylori] as the cause of diseases classified elsewhere: Secondary | ICD-10-CM

## 2014-08-04 DIAGNOSIS — Z7184 Encounter for health counseling related to travel: Secondary | ICD-10-CM

## 2014-08-04 DIAGNOSIS — A048 Other specified bacterial intestinal infections: Secondary | ICD-10-CM

## 2014-08-04 DIAGNOSIS — Z7189 Other specified counseling: Secondary | ICD-10-CM

## 2014-08-04 MED ORDER — AZITHROMYCIN 250 MG PO TABS: ORAL_TABLET | ORAL | Status: DC

## 2014-08-04 MED ORDER — CIPROFLOXACIN HCL 500 MG PO TABS
500.0000 mg | ORAL_TABLET | Freq: Two times a day (BID) | ORAL | Status: DC
Start: 2014-08-04 — End: 2014-08-04

## 2014-08-04 NOTE — Patient Instructions (Addendum)
-  Please drop of stool sample after being off Zantac, Dexilant, and any antibiotic for 2 weeks.    Please keep your follow up on 10/30/14.   Travel Meds Info: If you are traveling you can take these medications to be more prepared. If you get chest pain, shortness of breath or abdominal pain please go to the hospital wherever you may be.   Ciprofloxacin is good for travelers diarrhea, you can take 2 pills a day for 7 days. Or it is also good for urinary tract infections, you can take 2 a day for 7 days.  Zpak- is good for sinus infections please finish as prescribed Phenergran is for nausea but it sedating so plan on eating and sleeping.  Levsin is good for nausea, diarrhea, or abdominal cramping- it can constipate you so don't take too much. This dissolves under your tongue.  Prednisone is good for joint pain or rashes or spider bites- you can take it as prescribed but if you are feeling better you can stop it early.  Diflucan is for a yeast infection.

## 2014-08-04 NOTE — Progress Notes (Signed)
HPI  A Caucasian 54 y.o.male presents to the office today due to follow up for H. Pylori infection.  Patient was treated with Zantac, Dexilant, Amoxicillin and Doxycycline.  Did not use Clarithromycin due to heart medications.  The doxycycline 100mg - 1 tablet BID should have been finished 07/27/14.  Did finish antibiotics about 1 week ago today. Patient is still taking Zantac and Dexilant.  Patient states he is feeling much better.  He reports changes in bowel movements going back and forth from diarrhea and constipation, but is getting better.  He did notice some mucus in his stool at first, but states that is getting better.  He denies fever, chills, sweats, fatigue, CP, SOB, abdominal pain, nausea, vomiting, hematochezia, melena, headaches, dizziness or lightheadedness. He does report that he is traveling to [place] (leaving Friday 08/08/14 to 08/18/14) for mission trip.  He only needs Z-Pak and Cipro for the trip.  He denies nausea, motion sickness or altitude sickness.  Review of Systems  All other systems reviewed and are negative.  Past Medical History-  Past Medical History  Diagnosis Date  . Arthritis   . GERD (gastroesophageal reflux disease)   . Hypertension   . Other testicular hypofunction   . Prediabetes   . Hyperlipidemia    Medications-  Current Outpatient Prescriptions on File Prior to Visit  Medication Sig Dispense Refill  . Ascorbic Acid (VITAMIN C) 1000 MG tablet Take 1,000 mg by mouth daily.    Marland Kitchen aspirin 81 MG tablet Take 81 mg by mouth daily.    . B-D 3CC LUER-LOK SYR 23GX1-1/2 23G X 1-1/2" 3 ML MISC USE AS DIRECTED 12 each 1  . cholecalciferol (VITAMIN D) 1000 UNITS tablet Take 1,000 Units by mouth daily.    Marland Kitchen dexlansoprazole (DEXILANT) 60 MG capsule Take 1 capsule (60 mg total) by mouth daily. 30 capsule 0  . fish oil-omega-3 fatty acids 1000 MG capsule Take 2 g by mouth daily.    . meloxicam (MOBIC) 15 MG tablet TAKE 1 TABLET EVERY DAY AS NEEDED  PAIN/INFLAMMATION 90 tablet 1  . ranitidine (ZANTAC) 300 MG tablet TAKE 1 TABLET BY MOUTH ONCE TO TWICE DAILY AS NEEDED FOR INDIGESTION 180 tablet 99  . testosterone cypionate (DEPOTESTOTERONE CYPIONATE) 200 MG/ML injection INJECT 2MLS IM EVERY 2WKS 10 mL 1  . diltiazem (CARDIZEM CD) 240 MG 24 hr capsule TAKE ONE CAPSULE BY MOUTH EVERY DAY (Patient not taking: Reported on 08/04/2014) 90 capsule 99   No current facility-administered medications on file prior to visit.   Allergies- No Known Allergies  Physical Exam BP 136/82 mmHg  Pulse 80  Temp(Src) 98.2 F (36.8 C) (Temporal)  Resp 16  Ht 6\' 1"  (1.854 m)  Wt 206 lb (93.441 kg)  BMI 27.18 kg/m2  SpO2 97% Wt Readings from Last 3 Encounters:  08/04/14 206 lb (93.441 kg)  07/10/14 203 lb (92.08 kg)  04/22/14 202 lb 9.6 oz (91.899 kg)  Vitals Reviewed. General Appearance: Well nourished, in no apparent distress and had pleasant demeanor. Eyes:  PERRLA. EOMI. Conjunctiva is pink without edema, erythema or yellowing.  No scleral icterus. Neck: Supple,  Full range of motion in neck intact Respiratory: Chest wall expansion symmetrical.  CTAB,  r/r/w or stridor.  No increased effort of breathing. Cardio: RRR.   m/r/g.  S1S2nl.    Abdomen: Symmetrical, soft, nontender, and flat.  +BS nl x4. No bruits heard in aorta and femoral arteries.     No masses, hernias or pulsatile masses.  Rebound.  Guarding. Extremities:  C/C/E in upper and lower extremities.  Pulses B/L +2. Skin: Warm, dry, intact without rashes, lesions, ecchymosis, yellowing, cyanosis.  Neuro: Alert and oriented X3, cooperative.  Mood and affect appropriate to situation. CN II-XII grossly intact.   Normal gait. Psych: Insight and Judgment appropriate.   Assessment and Plan 1. Travel advice encounter -Take Z-Pak as needed- azithromycin (ZITHROMAX) 250 MG tablet; Take 2 tablets PO on Day 1, then 1 tablet PO QDaily for 4 days.  Dispense: 6 tablet; Refill: 0 -Take Cipro as  needed- ciprofloxacin (CIPRO) 500 MG tablet; Take 1 tablet (500 mg total) by mouth 2 (two) times daily. For 7 days  Dispense: 14 tablet; Refill: 0  2. H. pylori infection -Please stop taking Dexilant and Zantac for 2 weeks and collect stool specimen and drop it off here at the office. - H.Pylori Antigen Stool; Future  Discussed medication effects and SE's.  Pt agreed to treatment plan. Please keep your follow up appt on 10/30/14.  Mystique Bjelland, Stephani Police, PA-C 4:46 PM [facility]

## 2014-09-12 ENCOUNTER — Other Ambulatory Visit: Payer: 59

## 2014-09-12 DIAGNOSIS — A048 Other specified bacterial intestinal infections: Secondary | ICD-10-CM

## 2014-09-13 LAB — HELICOBACTER PYLORI  SPECIAL ANTIGEN: H. PYLORI Antigen: NEGATIVE

## 2014-10-30 ENCOUNTER — Encounter: Payer: Self-pay | Admitting: Internal Medicine

## 2014-10-30 ENCOUNTER — Ambulatory Visit (INDEPENDENT_AMBULATORY_CARE_PROVIDER_SITE_OTHER): Payer: 59 | Admitting: Internal Medicine

## 2014-10-30 VITALS — BP 110/80 | HR 76 | Temp 97.9°F | Resp 16 | Ht 73.0 in | Wt 199.8 lb

## 2014-10-30 DIAGNOSIS — I10 Essential (primary) hypertension: Secondary | ICD-10-CM

## 2014-10-30 DIAGNOSIS — E349 Endocrine disorder, unspecified: Secondary | ICD-10-CM

## 2014-10-30 DIAGNOSIS — Z79899 Other long term (current) drug therapy: Secondary | ICD-10-CM

## 2014-10-30 DIAGNOSIS — K219 Gastro-esophageal reflux disease without esophagitis: Secondary | ICD-10-CM

## 2014-10-30 DIAGNOSIS — R7303 Prediabetes: Secondary | ICD-10-CM

## 2014-10-30 DIAGNOSIS — R7309 Other abnormal glucose: Secondary | ICD-10-CM

## 2014-10-30 DIAGNOSIS — E785 Hyperlipidemia, unspecified: Secondary | ICD-10-CM

## 2014-10-30 DIAGNOSIS — E559 Vitamin D deficiency, unspecified: Secondary | ICD-10-CM

## 2014-10-30 LAB — CBC WITH DIFFERENTIAL/PLATELET
Basophils Absolute: 0.1 10*3/uL (ref 0.0–0.1)
Basophils Relative: 1 % (ref 0–1)
EOS ABS: 0.3 10*3/uL (ref 0.0–0.7)
Eosinophils Relative: 5 % (ref 0–5)
HCT: 44.6 % (ref 39.0–52.0)
Hemoglobin: 15.6 g/dL (ref 13.0–17.0)
LYMPHS ABS: 1.8 10*3/uL (ref 0.7–4.0)
LYMPHS PCT: 27 % (ref 12–46)
MCH: 30.5 pg (ref 26.0–34.0)
MCHC: 35 g/dL (ref 30.0–36.0)
MCV: 87.1 fL (ref 78.0–100.0)
MONO ABS: 0.4 10*3/uL (ref 0.1–1.0)
MPV: 10.1 fL (ref 8.6–12.4)
Monocytes Relative: 6 % (ref 3–12)
Neutro Abs: 4 10*3/uL (ref 1.7–7.7)
Neutrophils Relative %: 61 % (ref 43–77)
PLATELETS: 259 10*3/uL (ref 150–400)
RBC: 5.12 MIL/uL (ref 4.22–5.81)
RDW: 13.4 % (ref 11.5–15.5)
WBC: 6.6 10*3/uL (ref 4.0–10.5)

## 2014-10-30 NOTE — Patient Instructions (Signed)

## 2014-10-30 NOTE — Progress Notes (Signed)
Patient ID: Jack Russell, male   DOB: 1961/01/05, 54 y.o.   MRN: [id number]   This very nice 53 y.o.male presents for 3 month follow up with Hypertension, Hyperlipidemia, Pre-Diabetes, Testosterone  and Vitamin D Deficiency. Patient is maintained on a PPI for GERD and is attempting to taper off with Ranitidine.   Patient is treated for HTN & BP has been controlled at home. Today's BP: 110/80 mmHg. Patient has had no complaints of any cardiac type chest pain, palpitations, dyspnea/orthopnea/PND, dizziness, claudication, or dependent edema.   Hyperlipidemia is controlled with diet & meds. Patient denies myalgias or other med SE's. Last Lipids were not at goal -  Total Chol 222; HDL 46; elevated LDL  137; Triglycerides 193 on 04/22/2014.   Also, the patient has history of PreDiabetes and has had no symptoms of reactive hypoglycemia, diabetic polys, paresthesias or visual blurring.  Last A1c was  5.5% on 04/22/2014.   Patient is on Depo-Testosterone injection therapy with improved sense of well being. Further, the patient also has history of Vitamin D Deficiency of 17 in 2008 and supplements vitamin D without any suspected side-effects. Last vitamin D was 111 on 04/22/2014.  Medication Sig  . ALPRAZolam (XANAX) 0.5 MG tablet Take 1/2 to 1 tablet 2 x day if needed for anxiety.  Marland Kitchen VITAMIN C 1000 MG tablet Take 1,000 mg by mouth daily.  Marland Kitchen aspirin 81 MG tablet Take 81 mg by mouth daily.  Marland Kitchen VITAMIN D 1000  Take 1,000 Units by mouth daily.  . Diltiazem CD 240 MG  TAKE ONE CAPSULE BY MOUTH EVERY DAY  . fish oil 1000 MG cap Take 2 g by mouth daily.  . meloxicam (MOBIC) 15 MG tablet TAKE 1 TABLET EVERY DAY AS NEEDED PAIN/INFLAMMATION  .  DEPO-TESTOTERONE 200 MG/ML  INJECT 2MLS IM EVERY 2WKS  . dexlansoprazole 60 MG capsule Take 1 capsule (60 mg total) by mouth daily.  . Ranitidine 300 MG tablet TAKE 1 TABLET BY MOUTH ONCE TO TWICE DAILY AS NEEDED FOR INDIGESTION   No Known Allergies  PMHx:   Past Medical  History  Diagnosis Date  . Arthritis   . GERD (gastroesophageal reflux disease)   . Hypertension   . Other testicular hypofunction   . Prediabetes   . Hyperlipidemia    Immunization History  Administered Date(s) Administered  . Influenza Split 04/16/2013, 04/22/2014  . PPD Test 04/22/2014  . Pneumococcal Polysaccharide-23 04/12/2012  . Tdap 06/28/2011   Past Surgical History  Procedure Laterality Date  . Knee arthroscopy w/ meniscal repair  2009    right  . Colonoscopy    . Proximal interphalangeal fusion (pip) Left 05/14/2013    Procedure: RECONSTRUCTION BOUTONNIERE (LEFT SMALL FINGER) PINNING PROXIMAL INTERPHALANGEAL FUSION (PIP);  Surgeon: Wynonia Sours, MD;  Location: [place];  Service: Orthopedics;  Laterality: Left;   FHx:    Reviewed / unchanged  SHx:    Reviewed / unchanged  Systems Review:  Constitutional: Denies fever, chills, wt changes, headaches, insomnia, fatigue, night sweats, change in appetite. Eyes: Denies redness, blurred vision, diplopia, discharge, itchy, watery eyes.  ENT: Denies discharge, congestion, post nasal drip, epistaxis, sore throat, earache, hearing loss, dental pain, tinnitus, vertigo, sinus pain, snoring.  CV: Denies chest pain, palpitations, irregular heartbeat, syncope, dyspnea, diaphoresis, orthopnea, PND, claudication or edema. Respiratory: denies cough, dyspnea, DOE, pleurisy, hoarseness, laryngitis, wheezing.  Gastrointestinal: Denies dysphagia, odynophagia, heartburn, reflux, water brash, abdominal pain or cramps, nausea, vomiting, bloating, diarrhea, constipation, hematemesis, melena,  hematochezia  or hemorrhoids. Genitourinary: Denies dysuria, frequency, urgency, nocturia, hesitancy, discharge, hematuria or flank pain. Musculoskeletal: Denies arthralgias, myalgias, stiffness, jt. swelling, pain, limping or strain/sprain.  Skin: Denies pruritus, rash, hives, warts, acne, eczema or change in skin lesion(s). Neuro: No  weakness, tremor, incoordination, spasms, paresthesia or pain. Psychiatric: Denies confusion, memory loss or sensory loss. Endo: Denies change in weight, skin or hair change.  Heme/Lymph: No excessive bleeding, bruising or enlarged lymph nodes.  Physical Exam  BP 110/80  Pulse 76  Temp 97.9 F   Resp 16  Ht 6\' 1"    Wt 199 lb 12.8 oz    BMI 26.37   Appears well nourished and in no distress.  Eyes: PERRLA, EOMs, conjunctiva no swelling or erythema. Sinuses: No frontal/maxillary tenderness ENT/Mouth: EAC's clear, TM's nl w/o erythema, bulging. Nares clear w/o erythema, swelling, exudates. Oropharynx clear without erythema or exudates. Oral hygiene is good. Tongue normal, non obstructing. Hearing intact.  Neck: Supple. Thyroid nl. Car 2+/2+ without bruits, nodes or JVD. Chest: Respirations nl with BS clear & equal w/o rales, rhonchi, wheezing or stridor.  Cor: Heart sounds normal w/ regular rate and rhythm without sig. murmurs, gallops, clicks, or rubs. Peripheral pulses normal and equal  without edema.  Lymphatics: Unremarkable.  Musculoskeletal: Full ROM all peripheral extremities, joint stability, 5/5 strength, and normal gait.  Skin: Warm, dry without exposed rashes, lesions or ecchymosis apparent.  Neuro: Cranial nerves intact, reflexes equal bilaterally. Sensory-motor testing grossly intact. Tendon reflexes grossly intact.  Pysch: Alert & oriented x 3.  Insight and judgement nl & appropriate. No ideations.  Assessment and Plan:  1. Essential hypertension  - TSH  2. Hyperlipidemia  - Lipid panel  3. Prediabetes  - Hemoglobin A1c - Insulin, random  4. Vitamin D deficiency  - Vit D  25 hydroxy (rtn osteoporosis monitoring)  5. Testosterone deficiency  - Testosterone  6. Gastroesophageal reflux disease   7. Medication management  - CBC with Differential/Platelet - BASIC METABOLIC PANEL WITH GFR - Hepatic function panel - Magnesium   Recommended regular  exercise, BP monitoring, weight control, and discussed med and SE's. Recommended labs to assess and monitor clinical status. Further disposition pending results of labs. Over 30 minutes of exam, counseling, chart review was performed

## 2014-10-31 LAB — BASIC METABOLIC PANEL WITH GFR
BUN: 28 mg/dL — AB (ref 6–23)
CO2: 24 mEq/L (ref 19–32)
Calcium: 8.9 mg/dL (ref 8.4–10.5)
Chloride: 106 mEq/L (ref 96–112)
Creat: 0.91 mg/dL (ref 0.50–1.35)
GFR, Est African American: >89 mL/min
Glucose, Bld: 79 mg/dL (ref 70–99)
POTASSIUM: 4.2 meq/L (ref 3.5–5.3)
Sodium: 139 mEq/L (ref 135–145)

## 2014-10-31 LAB — HEPATIC FUNCTION PANEL
ALK PHOS: 46 U/L (ref 39–117)
ALT: 20 U/L (ref 0–53)
AST: 22 U/L (ref 0–37)
Albumin: 4.3 g/dL (ref 3.5–5.2)
BILIRUBIN INDIRECT: 0.5 mg/dL (ref 0.2–1.2)
Bilirubin, Direct: 0.1 mg/dL (ref 0.0–0.3)
TOTAL PROTEIN: 7 g/dL (ref 6.0–8.3)
Total Bilirubin: 0.6 mg/dL (ref 0.2–1.2)

## 2014-10-31 LAB — TESTOSTERONE: Testosterone: 1491.35 ng/dL — ABNORMAL HIGH (ref 300–890)

## 2014-10-31 LAB — LIPID PANEL
Cholesterol: 199 mg/dL (ref 0–200)
HDL: 41 mg/dL (ref >=40)
LDL Cholesterol: 146 mg/dL — ABNORMAL HIGH (ref 0–99)
Total CHOL/HDL Ratio: 4.9 Ratio
Triglycerides: 60 mg/dL (ref <150)
VLDL: 12 mg/dL (ref 0–40)

## 2014-10-31 LAB — TSH: TSH: 3.163 u[IU]/mL (ref 0.350–4.500)

## 2014-10-31 LAB — HEMOGLOBIN A1C
Hgb A1c MFr Bld: 5.9 % — ABNORMAL HIGH (ref <5.7)
Mean Plasma Glucose: 123 mg/dL — ABNORMAL HIGH (ref <117)

## 2014-10-31 LAB — INSULIN, RANDOM: INSULIN: 1.6 u[IU]/mL — AB (ref 2.0–19.6)

## 2014-10-31 LAB — VITAMIN D 25 HYDROXY (VIT D DEFICIENCY, FRACTURES): Vit D, 25-Hydroxy: 74 ng/mL (ref 30–100)

## 2014-10-31 LAB — MAGNESIUM: MAGNESIUM: 2 mg/dL (ref 1.5–2.5)

## 2014-11-02 ENCOUNTER — Encounter: Payer: Self-pay | Admitting: Internal Medicine

## 2015-02-19 ENCOUNTER — Encounter: Payer: Self-pay | Admitting: Gastroenterology

## 2015-03-06 ENCOUNTER — Encounter: Payer: Self-pay | Admitting: Internal Medicine

## 2015-03-06 ENCOUNTER — Ambulatory Visit (INDEPENDENT_AMBULATORY_CARE_PROVIDER_SITE_OTHER): Payer: 59 | Admitting: Internal Medicine

## 2015-03-06 VITALS — BP 124/76 | HR 90 | Temp 98.2°F | Resp 18 | Ht 73.0 in | Wt 198.0 lb

## 2015-03-06 DIAGNOSIS — R1013 Epigastric pain: Secondary | ICD-10-CM | POA: Diagnosis not present

## 2015-03-06 MED ORDER — OMEPRAZOLE 20 MG PO CPDR: 20.0000 mg | DELAYED_RELEASE_CAPSULE | Freq: Every day | ORAL | Status: DC

## 2015-03-06 MED ORDER — SUCRALFATE 1 G PO TABS: 1.0000 g | ORAL_TABLET | Freq: Four times a day (QID) | ORAL | Status: DC

## 2015-03-06 NOTE — Patient Instructions (Signed)
Stop taking mobic for the next two weeks.  Do not restart until your pain subsides.  Once the pain is gone you can start taking again with the omeprazole.    Please take the sucralfate up to 4 times a day as needed to help coat your stomach and help with painPeptic Ulcer A peptic ulcer is a sore in the lining of your esophagus (esophageal ulcer), stomach (gastric ulcer), or in the first part of your small intestine (duodenal ulcer). The ulcer causes erosion into the deeper tissue. CAUSES  Normally, the lining of the stomach and the small intestine protects itself from the acid that digests food. The protective lining can be damaged by:  An infection caused by a bacterium called Helicobacter pylori (H. pylori).  Regular use of nonsteroidal anti-inflammatory drugs (NSAIDs), such as ibuprofen or aspirin.  Smoking tobacco. Other risk factors include being older than 28, drinking alcohol excessively, and having a family history of ulcer disease.  SYMPTOMS   Burning pain or gnawing in the area between the chest and the belly button.  Heartburn.  Nausea and vomiting.  Bloating. The pain can be worse on an empty stomach and at night. If the ulcer results in bleeding, it can cause:  Black, tarry stools.  Vomiting of bright red blood.  Vomiting of coffee-ground-looking materials. DIAGNOSIS  A diagnosis is usually made based upon your history and an exam. Other tests and procedures may be performed to find the cause of the ulcer. Finding a cause will help determine the best treatment. Tests and procedures may include:  Blood tests, stool tests, or breath tests to check for the bacterium H. pylori.  An upper gastrointestinal (GI) series of the esophagus, stomach, and small intestine.  An endoscopy to examine the esophagus, stomach, and small intestine.  A biopsy. TREATMENT  Treatment may include:  Eliminating the cause of the ulcer, such as smoking, NSAIDs, or alcohol.  Medicines to  reduce the amount of acid in your digestive tract.  Antibiotic medicines if the ulcer is caused by the H. pylori bacterium.  An upper endoscopy to treat a bleeding ulcer.  Surgery if the bleeding is severe or if the ulcer created a hole somewhere in the digestive system. HOME CARE INSTRUCTIONS   Avoid tobacco, alcohol, and caffeine. Smoking can increase the acid in the stomach, and continued smoking will impair the healing of ulcers.  Avoid foods and drinks that seem to cause discomfort or aggravate your ulcer.  Only take medicines as directed by your caregiver. Do not substitute over-the-counter medicines for prescription medicines without talking to your caregiver.  Keep any follow-up appointments and tests as directed. SEEK MEDICAL CARE IF:   Your do not improve within 7 days of starting treatment.  You have ongoing indigestion or heartburn. SEEK IMMEDIATE MEDICAL CARE IF:   You have sudden, sharp, or persistent abdominal pain.  You have bloody or dark black, tarry stools.  You vomit blood or vomit that looks like coffee grounds.  You become light-headed, weak, or feel faint.  You become sweaty or clammy. MAKE SURE YOU:   Understand these instructions.  Will watch your condition.  Will get help right away if you are not doing well or get worse. Document Released: 07/08/2000 Document Revised: 11/25/2013 Document Reviewed: 02/08/2012 [facility] Patient Information 2015 [place], [place]. This information is not intended to replace advice given to you by your health care provider. Make sure you discuss any questions you have with your health care provider. Marland Kitchen  You will want to take omeprazole on an empty stomach and wait 30 minutes before eating.

## 2015-03-06 NOTE — Progress Notes (Signed)
Patient ID: Jack Russell, male   DOB: 03-05-1961, 54 y.o.   MRN: [id number]  HPI  Patient complains of AB pain. Onset was 1 week ago. Symptoms have been unchanged. The pain is described as aching and intermittent.  Feels similar to when he had an ulcer in the past. Pain is located in the epigastric region without radiation.  Aggravating factors: none.  Alleviating factors: eating. Associated symptoms: none. The patient denies anorexia, chills, constipation, diarrhea, fever, hematochezia, hematuria, melena, nausea and vomiting.   He is currently taking mobic daily for hand pain due to his job.    Review of Systems  Constitutional: Negative for fever, chills and malaise/fatigue.  HENT: Negative for congestion, ear pain and sore throat.   Respiratory: Negative for cough, shortness of breath and wheezing.   Cardiovascular: Negative for chest pain, palpitations and leg swelling.  Gastrointestinal: Positive for abdominal pain. Negative for nausea, vomiting, diarrhea, constipation, blood in stool and melena.  Genitourinary: Negative.   Skin: Negative.   Neurological: Negative for headaches.    Physical exam:  General:  Well developed well nourished, non-toxic appearing, NAD Head:  NCAT, PERLA, normal EOM, conjunctiva normal, ears clear bilaterally with normal TMs, Oropharynx clear and moist without exudate, no oropharyngeal erythema or swelling Neck:  No JVD, no cervical adenopathy, no thyromegaly Lungs:  Clear to auscultation A&P, no wheeze, rhonchi, rales.  Normal effort Heart:  RRR, no MRGs, normal peripheral pulses Abd:  +BS x 4, no distention, soft, non-tender, with no guarding, rigidity, or rebound.   GU:  No CVA tenderness bilaterally Skin:  No rash, warm and dry Neuro:  A&O x 3, CN II-XII intact, normal gait Psych:  Normal affect, no ideations, normal judgement and insight  Assessment and Plan:   1. Abdominal pain, epigastric -stop mobic - sucralfate (CARAFATE) 1 G tablet; Take 1  tablet (1 g total) by mouth 4 (four) times daily.  Dispense: 120 tablet; Refill: 1 - omeprazole (PRILOSEC) 20 MG capsule; Take 1 capsule (20 mg total) by mouth daily.  Dispense: 90 capsule; Refill: 1  -if no improvement consider checking for h.pylori and sending to GI

## 2015-04-26 ENCOUNTER — Other Ambulatory Visit: Payer: Self-pay | Admitting: Internal Medicine

## 2015-04-27 ENCOUNTER — Ambulatory Visit (INDEPENDENT_AMBULATORY_CARE_PROVIDER_SITE_OTHER): Payer: 59 | Admitting: Internal Medicine

## 2015-04-27 ENCOUNTER — Encounter: Payer: Self-pay | Admitting: Internal Medicine

## 2015-04-27 VITALS — BP 136/86 | HR 72 | Temp 97.3°F | Resp 16 | Ht 72.5 in | Wt 203.0 lb

## 2015-04-27 DIAGNOSIS — Z0000 Encounter for general adult medical examination without abnormal findings: Secondary | ICD-10-CM

## 2015-04-27 DIAGNOSIS — I10 Essential (primary) hypertension: Secondary | ICD-10-CM | POA: Diagnosis not present

## 2015-04-27 DIAGNOSIS — R7303 Prediabetes: Secondary | ICD-10-CM

## 2015-04-27 DIAGNOSIS — Z111 Encounter for screening for respiratory tuberculosis: Secondary | ICD-10-CM

## 2015-04-27 DIAGNOSIS — Z23 Encounter for immunization: Secondary | ICD-10-CM | POA: Diagnosis not present

## 2015-04-27 DIAGNOSIS — Z6824 Body mass index (BMI) 24.0-24.9, adult: Secondary | ICD-10-CM | POA: Insufficient documentation

## 2015-04-27 DIAGNOSIS — R5383 Other fatigue: Secondary | ICD-10-CM

## 2015-04-27 DIAGNOSIS — K219 Gastro-esophageal reflux disease without esophagitis: Secondary | ICD-10-CM

## 2015-04-27 DIAGNOSIS — Z79899 Other long term (current) drug therapy: Secondary | ICD-10-CM

## 2015-04-27 DIAGNOSIS — Z6826 Body mass index (BMI) 26.0-26.9, adult: Secondary | ICD-10-CM

## 2015-04-27 DIAGNOSIS — Z1212 Encounter for screening for malignant neoplasm of rectum: Secondary | ICD-10-CM

## 2015-04-27 DIAGNOSIS — Z0001 Encounter for general adult medical examination with abnormal findings: Secondary | ICD-10-CM

## 2015-04-27 DIAGNOSIS — E349 Endocrine disorder, unspecified: Secondary | ICD-10-CM

## 2015-04-27 DIAGNOSIS — Z125 Encounter for screening for malignant neoplasm of prostate: Secondary | ICD-10-CM

## 2015-04-27 DIAGNOSIS — E559 Vitamin D deficiency, unspecified: Secondary | ICD-10-CM

## 2015-04-27 DIAGNOSIS — E785 Hyperlipidemia, unspecified: Secondary | ICD-10-CM

## 2015-04-27 LAB — CBC WITH DIFFERENTIAL/PLATELET
BASOS PCT: 1 % (ref 0–1)
Basophils Absolute: 0.1 10*3/uL (ref 0.0–0.1)
Eosinophils Absolute: 0.6 10*3/uL (ref 0.0–0.7)
Eosinophils Relative: 11 % — ABNORMAL HIGH (ref 0–5)
HCT: 42.8 % (ref 39.0–52.0)
HEMOGLOBIN: 14.7 g/dL (ref 13.0–17.0)
LYMPHS PCT: 36 % (ref 12–46)
Lymphs Abs: 1.8 10*3/uL (ref 0.7–4.0)
MCH: 30.8 pg (ref 26.0–34.0)
MCHC: 34.3 g/dL (ref 30.0–36.0)
MCV: 89.5 fL (ref 78.0–100.0)
MONOS PCT: 12 % (ref 3–12)
MPV: 9.6 fL (ref 8.6–12.4)
Monocytes Absolute: 0.6 10*3/uL (ref 0.1–1.0)
NEUTROS ABS: 2 10*3/uL (ref 1.7–7.7)
NEUTROS PCT: 40 % — AB (ref 43–77)
Platelets: 236 10*3/uL (ref 150–400)
RBC: 4.78 MIL/uL (ref 4.22–5.81)
RDW: 13.2 % (ref 11.5–15.5)
WBC: 5 10*3/uL (ref 4.0–10.5)

## 2015-04-27 NOTE — Patient Instructions (Signed)
Recommend Adult Low dose Aspirin or   coated  Aspirin 81 mg daily   To reduce risk of Colon Cancer 20 %,   Skin Cancer 26 % ,   Melanoma 46%   and   Pancreatic cancer 60%  ++++++++++++++++++  Vitamin D goal   is between 70-100.   Please make sure that you are taking your Vitamin D as directed.   It is very important as a natural anti-inflammatory   helping hair, skin, and nails, as well as reducing stroke and heart attack risk.   It helps your bones and helps with mood.  It also decreases numerous cancer risks so please take it as directed.   Low Vit D is associated with a 200-300% higher risk for CANCER   and 200-300% higher risk for HEART   ATTACK  &  STROKE.   ......................................  It is also associated with higher death rate at younger ages,   autoimmune diseases like Rheumatoid arthritis, Lupus, Multiple Sclerosis.     Also many other serious conditions, like depression, Alzheimer's  Dementia, infertility, muscle aches, fatigue, fibromyalgia - just to name a few.  +++++++++++++++++++  Recommend the book "The END of DIETING" by Dr Joel Fuhrman   & the book "The END of DIABETES " by Dr Joel Fuhrman  At [employer] - get book & Audio CD's     Being diabetic has a  300% increased risk for heart attack, stroke, cancer, and alzheimer- type vascular dementia. It is very important that you work harder with diet by avoiding all foods that are white. Avoid white rice (brown & wild rice is OK), white potatoes (sweetpotatoes in moderation is OK), White bread or wheat bread or anything made out of white flour like bagels, donuts, rolls, buns, biscuits, cakes, pastries, cookies, pizza crust, and pasta (made from white flour & egg whites) - vegetarian pasta or spinach or wheat pasta is OK. Multigrain breads like Arnold's or [employer], or multigrain sandwich thins or flatbreads.  Diet, exercise and weight loss can reverse and cure diabetes in the early  stages.  Diet, exercise and weight loss is very important in the control and prevention of complications of diabetes which affects every system in your body, ie. Brain - dementia/stroke, eyes - glaucoma/blindness, heart - heart attack/heart failure, kidneys - dialysis, stomach - gastric paralysis, intestines - malabsorption, nerves - severe painful neuritis, circulation - gangrene & loss of a leg(s), and finally cancer and Alzheimers.    I recommend avoid fried & greasy foods,  sweets/candy, white rice (brown or wild rice or Quinoa is OK), white potatoes (sweet potatoes are OK) - anything made from white flour - bagels, doughnuts, rolls, buns, biscuits,white and wheat breads, pizza crust and traditional pasta made of white flour & egg white(vegetarian pasta or spinach or wheat pasta is OK).  Multi-grain bread is OK - like multi-grain flat bread or sandwich thins. Avoid alcohol in excess. Exercise is also important.    Eat all the vegetables you want - avoid meat, especially red meat and dairy - especially cheese.  Cheese is the most concentrated form of trans-fats which is the worst thing to clog up our arteries. Veggie cheese is OK which can be found in the fresh produce section at [employer] or [employer] or [employer]  ++++++++++++++++++++++++++   Preventive Care for Adults  A healthy lifestyle and preventive care can promote health and wellness. Preventive health guidelines for men include the following key practices:  A routine   yearly physical is a good way to check with your health care provider about your health and preventative screening. It is a chance to share any concerns and updates on your health and to receive a thorough exam.  Visit your dentist for a routine exam and preventative care every 6 months. Brush your teeth twice a day and floss once a day. Good oral hygiene prevents tooth decay and gum disease.  The frequency of eye exams is based on your age, health, family medical  history, use of contact lenses, and other factors. Follow your health care provider's recommendations for frequency of eye exams.  Eat a healthy diet. Foods such as vegetables, fruits, whole grains, low-fat dairy products, and lean protein foods contain the nutrients you need without too many calories. Decrease your intake of foods high in solid fats, added sugars, and salt. Eat the right amount of calories for you.Get information about a proper diet from your health care provider, if necessary.  Regular physical exercise is one of the most important things you can do for your health. Most adults should get at least 150 minutes of moderate-intensity exercise (any activity that increases your heart rate and causes you to sweat) each week. In addition, most adults need muscle-strengthening exercises on 2 or more days a week.  Maintain a healthy weight. The body mass index (BMI) is a screening tool to identify possible weight problems. It provides an estimate of body fat based on height and weight. Your health care provider can find your BMI and can help you achieve or maintain a healthy weight.For adults 20 years and older:  A BMI below 18.5 is considered underweight.  A BMI of 18.5 to 24.9 is normal.  A BMI of 25 to 29.9 is considered overweight.  A BMI of 30 and above is considered obese.  Maintain normal blood lipids and cholesterol levels by exercising and minimizing your intake of saturated fat. Eat a balanced diet with plenty of fruit and vegetables. Blood tests for lipids and cholesterol should begin at age 20 and be repeated every 5 years. If your lipid or cholesterol levels are high, you are over 50, or you are at high risk for heart disease, you may need your cholesterol levels checked more frequently.Ongoing high lipid and cholesterol levels should be treated with medicines if diet and exercise are not working.  If you smoke, find out from your health care provider how to quit. If you  do not use tobacco, do not start.  Lung cancer screening is recommended for adults aged 55-80 years who are at high risk for developing lung cancer because of a history of smoking. A yearly low-dose CT scan of the lungs is recommended for people who have at least a 30-pack-year history of smoking and are a current smoker or have quit within the past 15 years. A pack year of smoking is smoking an average of 1 pack of cigarettes a day for 1 year (for example: 1 pack a day for 30 years or 2 packs a day for 15 years). Yearly screening should continue until the smoker has stopped smoking for at least 15 years. Yearly screening should be stopped for people who develop a health problem that would prevent them from having lung cancer treatment.  If you choose to drink alcohol, do not have more than 2 drinks per day. One drink is considered to be 12 ounces (355 mL) of beer, 5 ounces (148 mL) of wine, or 1.5 ounces (44 mL)   of liquor.  Avoid use of street drugs. Do not share needles with anyone. Ask for help if you need support or instructions about stopping the use of drugs.  High blood pressure causes heart disease and increases the risk of stroke. Your blood pressure should be checked at least every 1-2 years. Ongoing high blood pressure should be treated with medicines, if weight loss and exercise are not effective.  If you are 46-55 years old, ask your health care provider if you should take aspirin to prevent heart disease.  Diabetes screening involves taking a blood sample to check your fasting blood sugar level. This should be done once every 3 years, after age 65, if you are within normal weight and without risk factors for diabetes. Testing should be considered at a younger age or be carried out more frequently if you are overweight and have at least 1 risk factor for diabetes.  Colorectal cancer can be detected and often prevented. Most routine colorectal cancer screening begins at the age of 47 and  continues through age 66. However, your health care provider may recommend screening at an earlier age if you have risk factors for colon cancer. On a yearly basis, your health care provider may provide home test kits to check for hidden blood in the stool. Use of a small camera at the end of a tube to directly examine the colon (sigmoidoscopy or colonoscopy) can detect the earliest forms of colorectal cancer. Talk to your health care provider about this at age 74, when routine screening begins. Direct exam of the colon should be repeated every 5-10 years through age 37, unless early forms of precancerous polyps or small growths are found.   Talk with your health care provider about prostate cancer screening.  Testicular cancer screening isrecommended for adult males. Screening includes self-exam, a health care provider exam, and other screening tests. Consult with your health care provider about any symptoms you have or any concerns you have about testicular cancer.  Use sunscreen. Apply sunscreen liberally and repeatedly throughout the day. You should seek shade when your shadow is shorter than you. Protect yourself by wearing long sleeves, pants, a wide-brimmed hat, and sunglasses year round, whenever you are outdoors.  Once a month, do a whole-body skin exam, using a mirror to look at the skin on your back. Tell your health care provider about new moles, moles that have irregular borders, moles that are larger than a pencil eraser, or moles that have changed in shape or color.  Stay current with required vaccines (immunizations).  Influenza vaccine. All adults should be immunized every year.  Tetanus, diphtheria, and acellular pertussis (Td, Tdap) vaccine. An adult who has not previously received Tdap or who does not know his vaccine status should receive 1 dose of Tdap. This initial dose should be followed by tetanus and diphtheria toxoids (Td) booster doses every 10 years. Adults with an unknown  or incomplete history of completing a 3-dose immunization series with Td-containing vaccines should begin or complete a primary immunization series including a Tdap dose. Adults should receive a Td booster every 10 years.  Varicella vaccine. An adult without evidence of immunity to varicella should receive 2 doses or a second dose if he has previously received 1 dose.  Human papillomavirus (HPV) vaccine. Males aged 32-21 years who have not received the vaccine previously should receive the 3-dose series. Males aged 22-26 years may be immunized. Immunization is recommended through the age of 35 years for any male  who has sex with males and did not get any or all doses earlier. Immunization is recommended for any person with an immunocompromised condition through the age of 33 years if he did not get any or all doses earlier. During the 3-dose series, the second dose should be obtained 4-8 weeks after the first dose. The third dose should be obtained 24 weeks after the first dose and 16 weeks after the second dose.  Zoster vaccine. One dose is recommended for adults aged 62 years or older unless certain conditions are present.    PREVNAR  - Pneumococcal 13-valent conjugate (PCV13) vaccine. When indicated, a person who is uncertain of his immunization history and has no record of immunization should receive the PCV13 vaccine. An adult aged 76 years or older who has certain medical conditions and has not been previously immunized should receive 1 dose of PCV13 vaccine. This PCV13 should be followed with a dose of pneumococcal polysaccharide (PPSV23) vaccine. The PPSV23 vaccine dose should be obtained at least 8 weeks after the dose of PCV13 vaccine. An adult aged 41 years or older who has certain medical conditions and previously received 1 or more doses of PPSV23 vaccine should receive 1 dose of PCV13. The PCV13 vaccine dose should be obtained 1 or more years after the last PPSV23 vaccine  dose.    PNEUMOVAX - Pneumococcal polysaccharide (PPSV23) vaccine. When PCV13 is also indicated, PCV13 should be obtained first. All adults aged 20 years and older should be immunized. An adult younger than age 2 years who has certain medical conditions should be immunized. Any person who resides in a nursing home or long-term care facility should be immunized. An adult smoker should be immunized. People with an immunocompromised condition and certain other conditions should receive both PCV13 and PPSV23 vaccines. People with human immunodeficiency virus (HIV) infection should be immunized as soon as possible after diagnosis. Immunization during chemotherapy or radiation therapy should be avoided. Routine use of PPSV23 vaccine is not recommended for American Indians, [place]atives, or people younger than 65 years unless there are medical conditions that require PPSV23 vaccine. When indicated, people who have unknown immunization and have no record of immunization should receive PPSV23 vaccine. One-time revaccination 5 years after the first dose of PPSV23 is recommended for people aged 19-64 years who have chronic kidney failure, nephrotic syndrome, asplenia, or immunocompromised conditions. People who received 1-2 doses of PPSV23 before age 62 years should receive another dose of PPSV23 vaccine at age 64 years or later if at least 5 years have passed since the previous dose. Doses of PPSV23 are not needed for people immunized with PPSV23 at or after age 83 years.    Hepatitis A vaccine. Adults who wish to be protected from this disease, have certain high-risk conditions, work with hepatitis A-infected animals, work in hepatitis A research labs, or travel to or work in countries with a high rate of hepatitis A should be immunized. Adults who were previously unvaccinated and who anticipate close contact with an international adoptee during the first 60 days after arrival in the [place] from a country  with a high rate of hepatitis A should be immunized.    Hepatitis B vaccine. Adults should be immunized if they wish to be protected from this disease, have certain high-risk conditions, may be exposed to blood or other infectious body fluids, are household contacts or sex partners of hepatitis B positive people, are clients or workers in certain care facilities, or travel to  or work in countries with a high rate of hepatitis B.   Preventive Service / Frequency   Ages 1 to 62  Blood pressure check.  Lipid and cholesterol check  Lung cancer screening. / Every year if you are aged 29-80 years and have a 30-pack-year history of smoking and currently smoke or have quit within the past 15 years. Yearly screening is stopped once you have quit smoking for at least 15 years or develop a health problem that would prevent you from having lung cancer treatment.  Fecal occult blood test (FOBT) of stool. / Every year beginning at age 67 and continuing until age 76. You may not have to do this test if you get a colonoscopy every 10 years.  Flexible sigmoidoscopy** or colonoscopy.** / Every 5 years for a flexible sigmoidoscopy or every 10 years for a colonoscopy beginning at age 9 and continuing until age 52. Screening for abdominal aortic aneurysm (AAA)  by ultrasound is recommended for people who have history of high blood pressure or who are current or former smokers.  GETTING OFF OF PPI's    Nexium/protonix/prilosec/Omeprazole/Dexilant/Aciphex are called PPI's, they are great at healing your stomach but should only be taken for a short period of time.     Recent studies have shown that taken for a long time they  can increase the risk of osteoporosis (weakening of your bones), pneumonia, low magnesium, restless legs, Cdiff (infection that causes diarrhea), DEMENTIA and most recently kidney damage / disease / insufficiency.     Due to this information we want to try to stop the PPI but if you try  to stop it abruptly this can cause rebound acid and worsening symptoms.   So this is how we want you to get off the PPI:  - Start taking the nexium/protonix/prilosec/PPI  every other day with  zantac (ranitidine) 2 x a day for 2-4 weeks  - then decrease the PPI to every 3 days while taking the zantac (ranitidine) twice a day the other  days for 2-4  Weeks  - then you can try the zantac (ranitidine) once at night or up to 2 x day as needed.  - you can continue on this once at night or stop all together  - Avoid alcohol, spicy foods, NSAIDS (aleve, ibuprofen) at this time. See foods below.   +++++++++++++++++++++++++++++++++++++++++++  Food Choices for Gastroesophageal Reflux Disease  When you have gastroesophageal reflux disease (GERD), the foods you eat and your eating habits are very important. Choosing the right foods can help ease the discomfort of GERD. WHAT GENERAL GUIDELINES DO I NEED TO FOLLOW?  Choose fruits, vegetables, whole grains, low-fat dairy products, and low-fat meat, fish, and poultry.  Limit fats such as oils, salad dressings, butter, nuts, and avocado.  Keep a food diary to identify foods that cause symptoms.  Avoid foods that cause reflux. These may be different for different people.  Eat frequent small meals instead of three large meals each day.  Eat your meals slowly, in a relaxed setting.  Limit fried foods.  Cook foods using methods other than frying.  Avoid drinking alcohol.  Avoid drinking large amounts of liquids with your meals.  Avoid bending over or lying down until 2-3 hours after eating.   WHAT FOODS ARE NOT RECOMMENDED? The following are some foods and drinks that may worsen your symptoms:  Vegetables Tomatoes. Tomato juice. Tomato and spaghetti sauce. Chili peppers. Onion and garlic. Horseradish. Fruits Oranges, grapefruit, and lemon (fruit  and juice). Meats High-fat meats, fish, and poultry. This includes hot dogs, ribs, ham,  sausage, salami, and bacon. Dairy Whole milk and chocolate milk. Sour cream. Cream. Butter. Ice cream. Cream cheese.  Beverages Coffee and tea, with or without caffeine. Carbonated beverages or energy drinks. Condiments Hot sauce. Barbecue sauce.  Sweets/Desserts Chocolate and cocoa. Donuts. Peppermint and spearmint. Fats and Oils High-fat foods, including [place] fries and potato chips. Other Vinegar. Strong spices, such as black pepper, white pepper, red pepper, cayenne, curry powder, cloves, ginger, and chili powder. Nexium/protonix/prilosec are called PPI's, they are great at healing your stomach but should only be taken for a short period of time.

## 2015-04-27 NOTE — Progress Notes (Signed)
Patient ID: Jack Russell, male   DOB: 05-Dec-1960, 54 y.o.   MRN: [id number]   Wellness & Preventative Visit & Comprehensive Evaluation, Examination & Management  This very nice 54 y.o. MWM presents for  presents for a Wellness Visit & comprehensive evaluation and management of multiple medical co-morbidities.  Patient has been followed for HTN, screened for Prediabetes, Hyperlipidemia, Testosterone and Vitamin D Deficiency. Patient also has Jerrye Bushy which is controlled with current meds.   Patient has hx/o Labile HTN predates since 2008 and has been monitored expectantly. In Nov 2012 he had his 1st episode of documented pAfib and over the years has used Diltiazem on a prn basis. Patient's BP has been controlled at home.Today's BP is borderline elevated at 136/86. Marland Kitchen Patient denies any cardiac symptoms as chest pain, palpitations, shortness of breath, dizziness or ankle swelling.   Patient's hyperlipidemia is not controlled with diet. Patient denies myalgias or other medication SE's. Last lipids were  Not at goal with Cholesterol 199; HDL 41; LDL 146*; and Triglycerides 60 on 10/30/2014.   Patient is screened proactively for prediabetes and he denies reactive hypoglycemic symptoms, visual blurring, diabetic polys or paresthesias. Last A1c was 5.9% on  10/30/2014.   Patient has been on Thyroid replacement since July 2007 for a low Testosterone level of 224. Finally, patient has history of Vitamin D Deficiency of 17 in 2008 and last vitamin D was 74 on 10/30/2014.      Medication Sig  . ALPRAZolam  0.5 MG tablet Take 1/2 to 1 tablet 2 x day if needed for anxiety.  Marland Kitchen VITAMIN C 1000 MG  Take 1 daily.  Marland Kitchen aspirin 81 MG Take  daily.  Marland Kitchen VITAMIN D [place]   . diltiazem CD 240 MG  TAKE ONE CAP  EVERY DAY  . fish oil 1000 MG c Take 2  daily.  . meloxicam  15 MG  TAKE 1 EVERY DAY AS NEEDED  . omeprazole 20 MG  Take 1 caps  daily.  . sucralfate  1 Gram Take 1 tab  4 times daily.  Marland Kitchen DEPO-TESTOSTERONE  200  MG/ML inj INJECT 2MLS IM EVERY 2WKS   No Known Allergies   Past Medical History  Diagnosis Date  . Arthritis   . GERD (gastroesophageal reflux disease)   . Hypertension   . Other testicular hypofunction   . Prediabetes   . Hyperlipidemia    Health Maintenance  Topic Date Due  . Hepatitis C Screening  November 11, 1960  . HIV Screening  04/15/1974  . INFLUENZA VACCINE  02/23/2015  . COLONOSCOPY  06/16/2019  . TETANUS/TDAP  06/27/2021   Immunization History  Administered Date(s) Administered  . Influenza Split 04/16/2013, 04/22/2014, 04/27/2015  . PPD Test 04/22/2014, 04/27/2015  . Pneumococcal Polysaccharide-23 04/12/2012  . Tdap 06/28/2011   Past Surgical History  Procedure Laterality Date  . Knee arthroscopy w/ meniscal repair  2009    right  . Colonoscopy    . Proximal interphalangeal fusion (pip) Left 05/14/2013    Procedure: RECONSTRUCTION BOUTONNIERE (LEFT SMALL FINGER) PINNING PROXIMAL INTERPHALANGEAL FUSION (PIP);  Surgeon: Wynonia Sours, MD;  Location: [facility];  Service: Orthopedics;  Laterality: Left;   Family History  Problem Relation Age of Onset  . Transient ischemic attack Mother   . Stroke Father   . Heart disease Father   . Hypertension Father   . Cancer Maternal Grandmother     colon    Social History   Social History  .  Marital Status: Married    Spouse Name: N/A  . Number of Children: N/A  . Years of Education: N/A   Occupational History  . 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 Smoking status: Never Smoker   . Smokeless tobacco: Not on file  . Alcohol Use: No  . Drug Use: No  . Sexual Activity: Not on file    ROS Constitutional: Denies fever, chills, weight loss/gain, headaches, insomnia,  night sweats or change in appetite. Does c/o fatigue. Eyes: Denies redness, blurred vision, diplopia, discharge, itchy or watery eyes.  ENT: Denies discharge, congestion, post nasal drip, epistaxis,  sore throat, earache, hearing loss, dental pain, Tinnitus, Vertigo, Sinus pain or snoring.  Cardio: Denies chest pain, palpitations, irregular heartbeat, syncope, dyspnea, diaphoresis, orthopnea, PND, claudication or edema Respiratory: denies cough, dyspnea, DOE, pleurisy, hoarseness, laryngitis or wheezing.  Gastrointestinal: Denies dysphagia, heartburn, reflux, water brash, pain, cramps, nausea, vomiting, bloating, diarrhea, constipation, hematemesis, melena, hematochezia, jaundice or hemorrhoids Genitourinary: Denies dysuria, frequency, urgency, nocturia, hesitancy, discharge, hematuria or flank pain Musculoskeletal: Denies arthralgia, myalgia, stiffness, Jt. Swelling, pain, limp or strain/sprain. Denies Falls. Skin: Denies puritis, rash, hives, warts, acne, eczema or change in skin lesion Neuro: No weakness, tremor, incoordination, spasms, paresthesia or pain Psychiatric: Denies confusion, memory loss or sensory loss. Denies Depression. Endocrine: Denies change in weight, skin, hair change, nocturia, and paresthesia, diabetic polys, visual blurring or hyper / hypo glycemic episodes.  Heme/Lymph: No excessive bleeding, bruising or enlarged lymph nodes.  Physical Exam  BP 136/86 mmHg  Pulse 72  Temp(Src) 97.3 F (36.3 C)  Resp 16  Ht 6' 0.5" (1.842 m)  Wt 203 lb (92.08 kg)  BMI 27.14 kg/m2  General Appearance: Well nourished &  in no apparent distress. Eyes: PERRLA, EOMs, conjunctiva no swelling or erythema, normal fundi and vessels. Sinuses: No frontal/maxillary tenderness ENT/Mouth: EACs patent / TMs  nl. Nares clear without erythema, swelling, mucoid exudates. Oral hygiene is good. No erythema, swelling, or exudate. Tongue normal, non-obstructing. Tonsils not swollen or erythematous. Hearing normal.  Neck: Supple, thyroid normal. No bruits, nodes or JVD. Respiratory: Respiratory effort normal.  BS equal and clear bilateral without rales, rhonci, wheezing or stridor. Cardio: Heart  sounds are normal with regular rate and rhythm and no murmurs, rubs or gallops. Peripheral pulses are normal and equal bilaterally without edema. No aortic or femoral bruits. Chest: symmetric with normal excursions and percussion.  Abdomen: Flat, soft, with bowel sounds. Nontender, no guarding, rebound, hernias, masses, or organomegaly.  Lymphatics: Non tender without lymphadenopathy.  Genitourinary: No hernias.Testes sl atrophic.Marland Kitchen DRE - prostate nl for age - smooth & firm w/o nodules. Musculoskeletal: Full ROM all peripheral extremities, joint stability, 5/5 strength, and normal gait. Skin: Warm and dry without rashes, lesions, cyanosis, clubbing or  ecchymosis.  Neuro: Cranial nerves intact, reflexes equal bilaterally. Normal muscle tone, no cerebellar symptoms. Sensation intact.  Pysch: Alert and oriented X 3 with normal affect, insight and judgment appropriate.   Assessment and Plan  1. Encounter for general adult medical examination with abnormal findings   2. Essential hypertension  - Microalbumin / creatinine urine ratio - EKG 12-Lead - [place], RETROPERITNL ABD,  LTD - TSH  3. Hyperlipidemia  - Lipid panel  4. Prediabetes  - Hemoglobin A1c - Insulin, random  5. Vitamin D deficiency  - Vit D  25 hydroxy   6. Testosterone deficiency  - Testosterone  7. Gastroesophageal reflux disease  8. Screening for rectal cancer  - POC Hemoccult Bld/Stl   9. Prostate cancer screening  - PSA  10. Other fatigue  - Vitamin B12 - Testosterone - Iron and TIBC - TSH  11. BMI 26.0-26.9,adult   12. Screening examination for pulmonary tuberculosis  - PPD  13. Need for prophylactic vaccination and inoculation against influenza  - Flu vaccine > 3yo with preservative IM (Fluvirin Influenza Split)  14. Medication management  - Urinalysis, Routine w reflex microscopic - CBC with Differential/Platelet - BASIC METABOLIC PANEL WITH GFR - Hepatic function panel -  Magnesium  15.  Paroxysmal Afib    Continue prudent diet as discussed, weight control, BP monitoring, regular exercise, and medications as discussed.  Discussed med effects and SE's. Routine screening labs and tests as requested with regular follow-up as recommended.  Over 40 minutes of exam, counseling &  chart review was performed

## 2015-04-28 LAB — BASIC METABOLIC PANEL WITH GFR
BUN: 18 mg/dL (ref 7–25)
CHLORIDE: 103 mmol/L (ref 98–110)
CO2: 29 mmol/L (ref 20–31)
Calcium: 8.9 mg/dL (ref 8.6–10.3)
Creat: 0.96 mg/dL (ref 0.70–1.33)
GFR, EST NON AFRICAN AMERICAN: 88 mL/min (ref >=60)
GFR, Est African American: >89 mL/min (ref >=60)
Glucose, Bld: 83 mg/dL (ref 65–99)
Potassium: 4.4 mmol/L (ref 3.5–5.3)
SODIUM: 140 mmol/L (ref 135–146)

## 2015-04-28 LAB — URINALYSIS, ROUTINE W REFLEX MICROSCOPIC
Bilirubin Urine: NEGATIVE
GLUCOSE, UA: NEGATIVE
Hgb urine dipstick: NEGATIVE
Ketones, ur: NEGATIVE
LEUKOCYTES UA: NEGATIVE
Nitrite: NEGATIVE
PROTEIN: NEGATIVE
SPECIFIC GRAVITY, URINE: 1.018 (ref 1.001–1.035)
pH: 6.5 (ref 5.0–8.0)

## 2015-04-28 LAB — MICROALBUMIN / CREATININE URINE RATIO
Creatinine, Urine: 142.7 mg/dL
Microalb Creat Ratio: 2.8 mg/g (ref 0.0–30.0)
Microalb, Ur: 0.4 mg/dL (ref <2.0)

## 2015-04-28 LAB — HEPATIC FUNCTION PANEL
ALK PHOS: 50 U/L (ref 40–115)
ALT: 18 U/L (ref 9–46)
AST: 20 U/L (ref 10–35)
Albumin: 4.1 g/dL (ref 3.6–5.1)
BILIRUBIN DIRECT: 0.1 mg/dL (ref <=0.2)
BILIRUBIN INDIRECT: 0.3 mg/dL (ref 0.2–1.2)
Total Bilirubin: 0.4 mg/dL (ref 0.2–1.2)
Total Protein: 6.5 g/dL (ref 6.1–8.1)

## 2015-04-28 LAB — IRON AND TIBC
%SAT: 12 % — AB (ref 15–60)
Iron: 31 ug/dL — ABNORMAL LOW (ref 50–180)
TIBC: 251 ug/dL (ref 250–425)
UIBC: 220 ug/dL (ref 125–400)

## 2015-04-28 LAB — INSULIN, RANDOM: Insulin: 6 u[IU]/mL (ref 2.0–19.6)

## 2015-04-28 LAB — LIPID PANEL
CHOL/HDL RATIO: 5.6 ratio — AB (ref <=5.0)
Cholesterol: 184 mg/dL (ref 125–200)
HDL: 33 mg/dL — ABNORMAL LOW (ref >=40)
LDL Cholesterol: 118 mg/dL (ref <130)
Triglycerides: 163 mg/dL — ABNORMAL HIGH (ref <150)
VLDL: 33 mg/dL — AB (ref <30)

## 2015-04-28 LAB — VITAMIN B12: VITAMIN B 12: 1380 pg/mL — AB (ref 211–911)

## 2015-04-28 LAB — TESTOSTERONE: TESTOSTERONE: 243 ng/dL — AB (ref 300–890)

## 2015-04-28 LAB — HEMOGLOBIN A1C
HEMOGLOBIN A1C: 5.4 % (ref <5.7)
Mean Plasma Glucose: 108 mg/dL (ref <117)

## 2015-04-28 LAB — MAGNESIUM: Magnesium: 2.2 mg/dL (ref 1.5–2.5)

## 2015-04-28 LAB — TSH: TSH: 2.415 u[IU]/mL (ref 0.350–4.500)

## 2015-04-28 LAB — PSA: PSA: 1.05 ng/mL (ref <=4.00)

## 2015-04-28 LAB — VITAMIN D 25 HYDROXY (VIT D DEFICIENCY, FRACTURES): Vit D, 25-Hydroxy: 72 ng/mL (ref 30–100)

## 2015-06-01 ENCOUNTER — Other Ambulatory Visit: Payer: Self-pay | Admitting: Internal Medicine

## 2015-06-01 DIAGNOSIS — G4733 Obstructive sleep apnea (adult) (pediatric): Secondary | ICD-10-CM

## 2015-06-21 ENCOUNTER — Other Ambulatory Visit: Payer: Self-pay | Admitting: Internal Medicine

## 2015-06-22 ENCOUNTER — Encounter: Payer: Self-pay | Admitting: Neurology

## 2015-06-22 ENCOUNTER — Ambulatory Visit (INDEPENDENT_AMBULATORY_CARE_PROVIDER_SITE_OTHER): Payer: 59 | Admitting: Neurology

## 2015-06-22 VITALS — BP 124/78 | HR 86 | Resp 20 | Ht 72.0 in | Wt 204.0 lb

## 2015-06-22 DIAGNOSIS — G4710 Hypersomnia, unspecified: Secondary | ICD-10-CM

## 2015-06-22 DIAGNOSIS — R0683 Snoring: Secondary | ICD-10-CM

## 2015-06-22 DIAGNOSIS — G4730 Sleep apnea, unspecified: Secondary | ICD-10-CM | POA: Diagnosis not present

## 2015-06-22 NOTE — Patient Instructions (Signed)

## 2015-06-22 NOTE — Progress Notes (Signed)
S[facility]EP [facility]INE CLINIC   Provider:  Larey Seat, M D  Referring Provider: [name], MD Primary Care Physician:  Alesia Richards, MD  Chief Complaint  Patient presents with  . New Patient (Initial Visit)    pt reports that he has an irregular heart beat, this is why he is here, Dr. Melford Aase sent him here for snoring, gasping for air, gets sleepy in the afternoons, rm 10, alone    HPI:  TRACE HANRAHAN is a 54 y.o. male, seen here as a referral from Dr. Melford Aase for irregular heart beats at night, palpitations may be related to undiagnosed apnea?    Mr. Bussie also reports that he has felt more tired more fatigued. His wife is a heavy sleeper as he recalled that and she is not necessarily aware of his personal sleep habits. He has heard that he has been snoring he has also been diagnosed with gastroesophageal reflux disease which is controlled on medication and has not seem to wake him from sleep was diagnosed with a low testosterone level, he had a vitamin D deficiency which she compensates for by daily 1000 units, he is not diabetic and he is not hyperlipidemic. He had also no evidence of thyroid disease been checked. He has a history of hypertension which predates 2008 and has been monitored. He had one documented atrial fibrillation episode in 2012 and was started on diltiazem for rate control. The patient has no other cardiac symptoms such as chest pain or tightness he does normally not feel short of breath and he is not dizzy or lightheaded. The last hemoglobin A1c is 5.9 in April 2016 vitamin D level in 2016 was 74 and had been 17 prior to supplementation.  Sleep habits are as follows: The patient's usual bedtime is between 10:30 and 11 PM and he falls asleep promptly. He wakes up several times, he seems to spontaneously wake up and is not quite sure what wakes him. When awake he has been aware that he may have to go to the bathroom at times he had irregular heartbeats but he  did not feel that those arose him. He has to rise at about 5:15 AM. He usually wakes up before his alarm rings. Average nocturnal sleep time is around 6 hours. His bedroom is described as core, quiet and dark. The couple shares the bedroom. His wife has not noticed any abnormal sleep behaviors, limb movements sleep talking or sleepwalking.  Sleep medical history and family sleep history:  The patient is unaware of any diagnosed sleep disorders in parents, siblings or his own children.  Youngest son had enlarged tonsils in infancy, age 35.  Social history: The patient does not have a significant history of shift work, he is a nonsmoker, he does not drink alcohol, he will drink a 12 ounces soda can with caffeine daily. Occasionally he will drink some iced tea.   Review of Systems: Out of a complete 14 system review, the patient complains of only the following symptoms, and all other reviewed systems are negative.   Epworth score  13 , Fatigue severity score 41  , depression score 2   Social History   Social History  . Marital Status: Married    Spouse Name: N/A  . Number of Children: N/A  . Years of Education: N/A   Occupational History  . Not on file.   Social History Main Topics  . Smoking status: Never Smoker   . Smokeless tobacco: Not on file  .  Alcohol Use: No  . Drug Use: No  . Sexual Activity: Not on file   Other Topics Concern  . Not on file   Social History Narrative    Family History  Problem Relation Age of Onset  . Transient ischemic attack Mother   . Stroke Father   . Heart disease Father   . Hypertension Father   . Cancer Maternal Grandmother     colon     Past Medical History  Diagnosis Date  . Arthritis   . GERD (gastroesophageal reflux disease)   . Hypertension   . Other testicular hypofunction   . Prediabetes   . Hyperlipidemia     Past Surgical History  Procedure Laterality Date  . Knee arthroscopy w/ meniscal repair  2009    right  .  Colonoscopy    . Proximal interphalangeal fusion (pip) Left 05/14/2013    Procedure: RECONSTRUCTION BOUTONNIERE (LEFT SMALL FINGER) PINNING PROXIMAL INTERPHALANGEAL FUSION (PIP);  Surgeon: Wynonia Sours, MD;  Location: [facility];  Service: Orthopedics;  Laterality: Left;    Current Outpatient Prescriptions  Medication Sig Dispense Refill  . ALPRAZolam (XANAX) 0.5 MG tablet Take 1/2 to 1 tablet 2 x day if needed for anxiety. 60 tablet 0  . Ascorbic Acid (VITAMIN C) 1000 MG tablet Take 1,000 mg by mouth daily.    Marland Kitchen aspirin 81 MG tablet Take 81 mg by mouth daily.    . B-D 3CC LUER-LOK SYR 23GX1-1/2 23G X 1-1/2" 3 ML MISC USE AS DIRECTED 12 each 1  . cholecalciferol (VITAMIN D) 1000 UNITS tablet Take 1,000 Units by mouth daily.    . fish oil-omega-3 fatty acids 1000 MG capsule Take 2 g by mouth daily.    . meloxicam (MOBIC) 15 MG tablet TAKE 1 TABLET EVERY DAY AS NEEDED 90 tablet 1  . omeprazole (PRILOSEC) 20 MG capsule Take 1 capsule (20 mg total) by mouth daily. 90 capsule 1  . ranitidine (ZANTAC) 300 MG tablet TAKE 1 TABLET BY MOUTH ONCE TO TWICE DAILY AS NEEDED FOR INDIGESTION 180 tablet 2  . sucralfate (CARAFATE) 1 G tablet Take 1 tablet (1 g total) by mouth 4 (four) times daily. 120 tablet 1  . testosterone cypionate (DEPOTESTOTERONE CYPIONATE) 200 MG/ML injection INJECT 2MLS IM EVERY 2WKS 10 mL 1   No current facility-administered medications for this visit.    Allergies as of 06/22/2015  . (No Known Allergies)    Vitals: BP 124/78 mmHg  Pulse 86  Resp 20  Ht 6' (1.829 m)  Wt 204 lb (92.534 kg)  BMI 27.66 kg/m2 Last Weight:  Wt Readings from Last 1 Encounters:  06/22/15 204 lb (92.534 kg)   [id number] mass index is 27.66 kg/(m^2).     Last Height:   Ht Readings from Last 1 Encounters:  06/22/15 6' (1.829 m)    Physical exam:  General: The patient is awake, alert and appears not in acute distress. The patient is well groomed. Head: Normocephalic, atraumatic.  Neck is supple. Mallampati 2  neck circumference: 16. Nasal airflow unrestricted , TMJ click bilaterally  evident . Retrognathia is not seen. The patient wore a night guard for bruxism.   Cardiovascular:  Regular rate and rhythm , without  murmurs or carotid bruit, and without distended neck veins. Respiratory: Lungs are clear to auscultation. Skin:  Without evidence of edema, or rash Trunk: BMI is normal  The patient's posture is erect.  Neurologic exam : The patient is awake and alert, oriented  to place and time.   Memory subjective described as intact.  Attention span & concentration ability appears normal.  Speech is fluent,  without dysarthria, dysphonia or aphasia.  Mood and affect are appropriate.  Cranial nerves: Pupils are equal and briskly reactive to light. Funduscopic exam without evidence of pallor or edema.  Extraocular movements in vertical and horizontal planes intact and without nystagmus. Visual fields by finger perimetry are intact. Hearing to finger rub intact.   Facial sensation intact to fine touch.  Facial motor strength is symmetric and tongue and uvula move midline. Shoulder shrug was symmetrical.   Motor exam:   Normal tone, muscle bulk and symmetric strength in all extremities.  Sensory:  Fine touch, pinprick and vibration were tested in all extremities. Proprioception tested in the upper extremities was normal.  Coordination: Rapid alternating movements in the fingers/hands was normal. Finger-to-nose maneuver normal without evidence of ataxia, dysmetria or tremor.  Gait and station: Patient walks without assistive device and is able unassisted to climb up to the exam table. Strength within normal limits.  Stance is stable and normal.  Deep tendon reflexes: in the  upper and lower extremities are symmetric and intact.    The patient was advised of the nature of the diagnosed sleep disorder , the treatment options and risks for general a health and wellness  arising from not treating the condition.  I spent more than 30 minutes of face to face time with the patient.  Greater than 50% of time was spent in counseling and coordination of care. We have discussed the diagnosis and differential and I answered the patient's questions.     Assessment:  After physical and neurologic examination, review of laboratory studies,  Personal review of imaging studies, reports of other /same  Imaging studies ,  Results of polysomnography/ neurophysiology testing and pre-existing records as far as provided in visit., my assessment is   1) Mr. Gabrys presents with clinical evidence of daytime fatigue and sleepiness but has evolved over the last couple of years. He was not excessively sleepy during his childhood school or stooling years. 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 His wife has noticed him to snore and she strongly suspects apnea and he has been approached by his dentist. I have noticed that he has a normal muscle tone that his chest wall movements are symmetric and regular and he has a regular pulse. Mallampati is grade 2 but his uvula is rather elongated and thickened. I can easily imagine that in supine sleep Mr. Jamey Reas would be a snorer.   I doubt that he would be snoring when sleeping on his side or on his belly. 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 For this reason we will order a sleep test. He has no sleep related headaches, he does not wake up with headaches but occasionally had palpitations at night. I will ask my sleep tech to make sure that we have a clear EKG tracing for the night of the test. Should we find only mild apnea or no apnea at all while snoring, I would prefer a dental device to advance the mandibular forward over treatment by CPAP.    Rv after PSG.    Asencion Partridge Emunah Texidor MD  06/22/2015   CC: [name], [address] [place], [place] [address]

## 2015-07-30 ENCOUNTER — Ambulatory Visit (INDEPENDENT_AMBULATORY_CARE_PROVIDER_SITE_OTHER): Payer: 59 | Admitting: Neurology

## 2015-07-30 DIAGNOSIS — G4730 Sleep apnea, unspecified: Secondary | ICD-10-CM

## 2015-07-30 DIAGNOSIS — G4710 Hypersomnia, unspecified: Secondary | ICD-10-CM

## 2015-07-30 DIAGNOSIS — R0683 Snoring: Secondary | ICD-10-CM

## 2015-07-31 ENCOUNTER — Other Ambulatory Visit: Payer: Self-pay | Admitting: Physician Assistant

## 2015-07-31 MED ORDER — PROMETHAZINE HCL 25 MG PO TABS: 25.0000 mg | ORAL_TABLET | Freq: Four times a day (QID) | ORAL | Status: DC | PRN

## 2015-07-31 MED ORDER — AZITHROMYCIN 250 MG PO TABS: ORAL_TABLET | ORAL | Status: AC

## 2015-07-31 MED ORDER — ALPRAZOLAM 0.5 MG PO TABS: ORAL_TABLET | ORAL | Status: DC

## 2015-07-31 NOTE — Progress Notes (Signed)
Rx called into [facility] pharmacy. 

## 2015-07-31 NOTE — Sleep Study (Signed)
Please see the scanned sleep study interpretation located in the procedure tab within the chart review section.   

## 2015-08-05 ENCOUNTER — Telehealth: Payer: Self-pay

## 2015-08-05 NOTE — Telephone Encounter (Signed)
Called pt to discuss sleep study results. No answer, left a message asking him to call me back. 

## 2015-08-06 ENCOUNTER — Encounter: Payer: Self-pay | Admitting: Physician Assistant

## 2015-08-06 DIAGNOSIS — G2581 Restless legs syndrome: Secondary | ICD-10-CM | POA: Insufficient documentation

## 2015-08-06 NOTE — Telephone Encounter (Signed)
Spoke to pt regarding his sleep study results. I advised pt that his study does not reveal significant sleep apnea or PLMs. I advised pt to avoid caffeine-containing beverages and chocolate. I advised pt that we could obtain blood work if RLS is a problem for him. He denies having problems with RLS. Pt reports that he just wanted to make sure he did not have sleep apnea. I advised pt that we could make a follow up appt with Dr. Brett Fairy if he would like, but pt declined at this time. Pt is requesting a copy of his sleep study to be sent to Dr. Melford Aase.

## 2015-08-06 NOTE — Telephone Encounter (Signed)
Patient returned Kristen's call, please call [phone].

## 2015-09-11 ENCOUNTER — Encounter: Payer: Self-pay | Admitting: *Deleted

## 2015-09-24 ENCOUNTER — Ambulatory Visit (INDEPENDENT_AMBULATORY_CARE_PROVIDER_SITE_OTHER): Payer: 59 | Admitting: Internal Medicine

## 2015-09-24 ENCOUNTER — Encounter: Payer: Self-pay | Admitting: Internal Medicine

## 2015-09-24 VITALS — BP 140/88 | HR 78 | Temp 98.2°F | Resp 16 | Ht 72.05 in | Wt 194.0 lb

## 2015-09-24 DIAGNOSIS — J069 Acute upper respiratory infection, unspecified: Secondary | ICD-10-CM

## 2015-09-24 MED ORDER — PREDNISONE 20 MG PO TABS
ORAL_TABLET | ORAL | Status: DC
Start: 2015-09-24 — End: 2015-10-26

## 2015-09-24 MED ORDER — PROMETHAZINE-DM 6.25-15 MG/5ML PO SYRP: ORAL_SOLUTION | ORAL | Status: DC

## 2015-09-24 MED ORDER — MOMETASONE FUROATE 50 MCG/ACT NA SUSP: 2.0000 | Freq: Every day | NASAL | Status: DC

## 2015-09-24 NOTE — Patient Instructions (Signed)
Please continue to take zyrtec daily.  Finish the zpak  Please use saline in your nose as often as possible.  Please take a probiotic or eat yogurt daily.  Please use nasonex 2 sprays per nostril nightly at bedtime.   Please take cough syrup as needed for cough and congestion.  Please call the office for worsening symptoms.  Please drink plenty of water and rest as much as possible.  ASSUME YOU ARE CONTAGIOUS.

## 2015-09-24 NOTE — Progress Notes (Signed)
HPI  Patient presents to the office for evaluation of cough, runny nose, and sneezing.  It has been going on for 5 days.  Patient reports mild cough.  They also endorse aching, low appetite, tired, nasal congestion, sneezing.  .  They have tried antibiotics.  They report that nothing has worked.  They denies other sick contacts. He does tend to have some seasonal allergies, but generally this year.    Review of Systems  Constitutional: Positive for chills and malaise/fatigue. Negative for fever.  HENT: Positive for congestion and ear pain. Negative for sore throat.   Respiratory: Positive for cough. Negative for sputum production, shortness of breath and wheezing.   Cardiovascular: Negative for chest pain, palpitations and leg swelling.  Skin: Negative.   Neurological: Negative for headaches.    PE:  Filed Vitals:   09/24/15 1042  BP: 140/88  Pulse: 78  Temp: 98.2 F (36.8 C)  Resp: 16   General:  Alert and non-toxic, WDWN, NAD HEENT: NCAT, PERLA, EOM normal, no occular discharge or erythema.  Nasal mucosal edema with sinus tenderness to palpation.  Oropharynx clear with minimal oropharyngeal edema and erythema.  Mucous membranes moist and pink. Neck:  Cervical adenopathy Chest:  RRR no MRGs.  Lungs clear to auscultation A&P with no wheezes rhonchi or rales.   Abdomen: +BS x 4 quadrants, soft, non-tender, no guarding, rigidity, or rebound. Skin: warm and dry no rash Neuro: A&Ox4, CN II-XII grossly intact  Assessment and Plan:   1. Acute URI -nasal saline -cont cetirizine -rest -push fluids -finish zpak - promethazine-dextromethorphan (PROMETHAZINE-DM) 6.25-15 MG/5ML syrup; Take 5-10 mL PO q8hrs prn for cold symptoms  Dispense: 360 mL; Refill: 1 - mometasone (NASONEX) 50 MCG/ACT nasal spray; Place 2 sprays into the nose daily.  Dispense: 17 g; Refill: 2 - predniSONE (DELTASONE) 20 MG tablet; 3 tabs po day one, then 2 tabs daily x 4 days  Dispense: 11 tablet; Refill: 0

## 2015-10-26 ENCOUNTER — Ambulatory Visit (INDEPENDENT_AMBULATORY_CARE_PROVIDER_SITE_OTHER): Payer: 59 | Admitting: Internal Medicine

## 2015-10-26 ENCOUNTER — Encounter: Payer: Self-pay | Admitting: Internal Medicine

## 2015-10-26 VITALS — BP 126/82 | HR 78 | Temp 98.2°F | Resp 16 | Ht 72.05 in | Wt 196.0 lb

## 2015-10-26 DIAGNOSIS — R7303 Prediabetes: Secondary | ICD-10-CM

## 2015-10-26 DIAGNOSIS — Z79899 Other long term (current) drug therapy: Secondary | ICD-10-CM | POA: Diagnosis not present

## 2015-10-26 DIAGNOSIS — E785 Hyperlipidemia, unspecified: Secondary | ICD-10-CM

## 2015-10-26 DIAGNOSIS — E559 Vitamin D deficiency, unspecified: Secondary | ICD-10-CM | POA: Diagnosis not present

## 2015-10-26 DIAGNOSIS — I10 Essential (primary) hypertension: Secondary | ICD-10-CM

## 2015-10-26 LAB — CBC WITH DIFFERENTIAL/PLATELET
BASOS PCT: 1 %
Basophils Absolute: 52 cells/uL (ref 0–200)
Eosinophils Absolute: 364 cells/uL (ref 15–500)
Eosinophils Relative: 7 %
HEMATOCRIT: 42.6 % (ref 38.5–50.0)
HEMOGLOBIN: 14.5 g/dL (ref 13.2–17.1)
LYMPHS ABS: 1924 {cells}/uL (ref 850–3900)
Lymphocytes Relative: 37 %
MCH: 30.6 pg (ref 27.0–33.0)
MCHC: 34 g/dL (ref 32.0–36.0)
MCV: 89.9 fL (ref 80.0–100.0)
MONO ABS: 364 {cells}/uL (ref 200–950)
MPV: 10.3 fL (ref 7.5–12.5)
Monocytes Relative: 7 %
NEUTROS ABS: 2496 {cells}/uL (ref 1500–7800)
Neutrophils Relative %: 48 %
Platelets: 219 10*3/uL (ref 140–400)
RBC: 4.74 MIL/uL (ref 4.20–5.80)
RDW: 13.3 % (ref 11.0–15.0)
WBC: 5.2 10*3/uL (ref 3.8–10.8)

## 2015-10-26 NOTE — Progress Notes (Signed)
Assessment and Plan:  Hypertension:  -Continue medication,  -monitor blood pressure at home.  -Continue DASH diet.   -Reminder to go to the ER if any CP, SOB, nausea, dizziness, severe HA, changes vision/speech, left arm numbness and tingling, and jaw pain.  Cholesterol: -Continue diet and exercise.  -Check cholesterol.   Pre-diabetes: -Continue diet and exercise.  -Check A1C  Vitamin D Def: -check level -continue medications.   Continue diet and meds as discussed. Further disposition pending results of labs.  HPI 55 y.o. male  presents for 3 month follow up with hypertension, hyperlipidemia, prediabetes and vitamin D.   His blood pressure has been controlled at home, today their BP is BP: 126/82 mmHg.   He does not workout. He denies chest pain, shortness of breath, dizziness.   He is on cholesterol medication and denies myalgias. His cholesterol is at goal. The cholesterol last visit was:   Lab Results  Component Value Date   CHOL 184 04/27/2015   HDL 33* 04/27/2015   LDLCALC 118 04/27/2015   TRIG 163* 04/27/2015   CHOLHDL 5.6* 04/27/2015     He has been working on diet and exercise for prediabetes, and denies foot ulcerations, hyperglycemia, hypoglycemia , increased appetite, nausea, paresthesia of the feet, polydipsia, polyuria, visual disturbances, vomiting and weight loss. Last A1C in the office was:  Lab Results  Component Value Date   HGBA1C 5.4 04/27/2015    Patient is on Vitamin D supplement.  Lab Results  Component Value Date   VD25OH 72 04/27/2015     Patient reports no other concerns or complaints at this time.    No refills needed at this time.    Current Medications:  Current Outpatient Prescriptions on File Prior to Visit  Medication Sig Dispense Refill  . ALPRAZolam (XANAX) 0.5 MG tablet Take 1/2 to 1 tablet 2 x day if needed for anxiety. 60 tablet 0  . Ascorbic Acid (VITAMIN C) 1000 MG tablet Take 1,000 mg by mouth daily.    Marland Kitchen aspirin 81 MG  tablet Take 81 mg by mouth daily.    . B-D 3CC LUER-LOK SYR 23GX1-1/2 23G X 1-1/2" 3 ML MISC USE AS DIRECTED 12 each 1  . cholecalciferol (VITAMIN D) 1000 UNITS tablet Take 1,000 Units by mouth daily.    . fish oil-omega-3 fatty acids 1000 MG capsule Take 2 g by mouth daily.    . meloxicam (MOBIC) 15 MG tablet TAKE 1 TABLET EVERY DAY AS NEEDED 90 tablet 1  . testosterone cypionate (DEPOTESTOTERONE CYPIONATE) 200 MG/ML injection INJECT 2MLS IM EVERY 2WKS 10 mL 1   No current facility-administered medications on file prior to visit.    Medical History:  Past Medical History  Diagnosis Date  . Arthritis   . GERD (gastroesophageal reflux disease)   . Hypertension   . Other testicular hypofunction   . Prediabetes   . Hyperlipidemia     Allergies: No Known Allergies   Review of Systems:  Review of Systems  Constitutional: Negative for fever, chills and malaise/fatigue.  HENT: Negative for congestion, ear pain and sore throat.   Eyes: Negative.   Respiratory: Negative for cough, shortness of breath and wheezing.   Cardiovascular: Negative for chest pain, palpitations and leg swelling.  Gastrointestinal: Negative for heartburn, abdominal pain, diarrhea, constipation, blood in stool and melena.  Genitourinary: Negative.   Skin: Negative.   Neurological: Negative for dizziness, seizures, loss of consciousness and headaches.  Psychiatric/Behavioral: Negative for depression. The patient is not nervous/anxious  and does not have insomnia.     Family history- Review and unchanged  Social history- Review and unchanged  Physical Exam: BP 126/82 mmHg  Pulse 78  Temp(Src) 98.2 F (36.8 C) (Temporal)  Resp 16  Ht 6' 0.05" (1.83 m)  Wt 196 lb (88.905 kg)  BMI 26.55 kg/m2 Wt Readings from Last 3 Encounters:  10/26/15 196 lb (88.905 kg)  09/24/15 194 lb (87.998 kg)  06/22/15 204 lb (92.534 kg)    General Appearance: Well nourished well developed, in no apparent distress. Eyes:  PERRLA, EOMs, conjunctiva no swelling or erythema ENT/Mouth: Ear canals normal without obstruction, swelling, erythma, discharge.  TMs normal bilaterally.  Oropharynx moist, clear, without exudate, or postoropharyngeal swelling. Neck: Supple, thyroid normal,no cervical adenopathy  Respiratory: Respiratory effort normal, Breath sounds clear A&P without rhonchi, wheeze, or rale.  No retractions, no accessory usage. Cardio: RRR with no MRGs. Brisk peripheral pulses without edema.  Abdomen: Soft, + BS,  Non tender, no guarding, rebound, hernias, masses. Musculoskeletal: Full ROM, 5/5 strength, Normal gait Skin: Warm, dry without rashes, lesions, ecchymosis.  Neuro: Awake and oriented X 3, Cranial nerves intact. Normal muscle tone, no cerebellar symptoms. Psych: Normal affect, Insight and Judgment appropriate.    Starlyn Skeans, PA-C 2:58 PM [facility]

## 2015-10-26 NOTE — Patient Instructions (Signed)
Bisoprolol tablets What is this medicine? BISOPROLOL (bis [place] proe lol) is a beta-blocker. Beta-blockers reduce the workload on the heart and help it to beat more regularly. This medicine is used to treat high blood pressure. This medicine may be used for other purposes; ask your health care provider or pharmacist if you have questions. What should I tell my health care provider before I take this medicine? They need to know if you have any of these conditions: -chest pain (angina) -diabetes -heart or vessel disease like slow heart rate, worsening heart failure, heart block, sick sinus syndrome or Raynaud's disease -kidney disease -liver disease -lung or breathing disease, like asthma or emphysema -pheochromocytoma -thyroid disease -an unusual or allergic reaction to bisoprolol, other beta-blockers, medicines, foods, dyes, or preservatives -pregnant or trying to get pregnant -breast-feeding How should I use this medicine? Take this medicine by mouth with a glass of water. Follow the directions on the prescription label. You can take this medicine with or without food. Take your doses at regular intervals. Do not take your medicine more often than directed. Do not stop taking this medicine suddenly. This could lead to serious heart-related effects. Talk to your pediatrician regarding the use of this medicine in children. Special care may be needed. Overdosage: If you think you have taken too much of this medicine contact a poison control center or emergency room at once. NOTE: This medicine is only for you. Do not share this medicine with others. What if I miss a dose? If you miss a dose, take it as soon as you can. If it is almost time for your next dose, take only that dose. Do not take double or extra doses. What may interact with this medicine? This medicine may interact with the following medications: -certain medicines for blood pressure, heart disease, irregular heart beat -NSAIDs,  medicines for pain and inflammation, like ibuprofen or naproxen -rifampin This list may not describe all possible interactions. Give your health care provider a list of all the medicines, herbs, non-prescription drugs, or dietary supplements you use. Also tell them if you smoke, drink alcohol, or use illegal drugs. Some items may interact with your medicine. What should I watch for while using this medicine? Visit your doctor or health care professional for regular checks on your progress. Check your heart rate and blood pressure regularly while you are taking this medicine. Ask your doctor or health care professional what your heart rate and blood pressure should be, and when you should contact him or her. You may get drowsy or dizzy. Do not drive, use machinery, or do anything that needs mental alertness until you know how this drug affects you. Do not stand or sit up quickly, especially if you are an older patient. This reduces the risk of dizzy or fainting spells. Alcohol can make you more drowsy and dizzy. Avoid alcoholic drinks. This medicine can affect blood sugar levels. If you have diabetes, check with your doctor or health care professional before you change your diet or the dose of your diabetic medicine. Do not treat yourself for coughs, colds, or pain while you are taking this medicine without asking your doctor or health care professional for advice. Some ingredients may increase your blood pressure. What side effects may I notice from receiving this medicine? Side effects that you should report to your doctor or health care professional as soon as possible: -allergic reactions like skin rash, itching or hives, swelling of the face, lips, or tongue -breathing problems -  chest pain -cold, tingling, or numb hands or feet -confusion -irregular, slow heartbeat -muscle aches and pains -sweating -swollen legs or ankles -tremors -vomiting Side effects that usually do not require medical  attention (report to your doctor or health care professional if they continue or are bothersome): -anxiety -change in sex drive or performance -depression -diarrhea -dry or burning eyes -headache -nausea This list may not describe all possible side effects. Call your doctor for medical advice about side effects. You may report side effects to FDA at 1-800-FDA-1088. Where should I keep my medicine? Keep out of the reach of children. Store at room temperature between 20 and 25 degrees C (68 and 77 degrees F). Protect from moisture. Throw away any unused medicine after the expiration date. NOTE: This sheet is a summary. It may not cover all possible information. If you have questions about this medicine, talk to your doctor, pharmacist, or health care provider.    2016, Elsevier/Gold Standard. (2013-03-15 14:30:04)

## 2015-10-27 LAB — BASIC METABOLIC PANEL WITH GFR
BUN: 20 mg/dL (ref 7–25)
CO2: 27 mmol/L (ref 20–31)
Calcium: 9.1 mg/dL (ref 8.6–10.3)
Chloride: 102 mmol/L (ref 98–110)
Creat: 0.95 mg/dL (ref 0.70–1.33)
GFR, EST NON AFRICAN AMERICAN: 89 mL/min (ref >=60)
GFR, Est African American: >89 mL/min (ref >=60)
GLUCOSE: 84 mg/dL (ref 65–99)
POTASSIUM: 4.2 mmol/L (ref 3.5–5.3)
SODIUM: 138 mmol/L (ref 135–146)

## 2015-10-27 LAB — LIPID PANEL
Cholesterol: 210 mg/dL — ABNORMAL HIGH (ref 125–200)
HDL: 40 mg/dL (ref >=40)
LDL CALC: 128 mg/dL (ref <130)
Total CHOL/HDL Ratio: 5.3 Ratio — ABNORMAL HIGH (ref <=5.0)
Triglycerides: 209 mg/dL — ABNORMAL HIGH (ref <150)
VLDL: 42 mg/dL — AB (ref <30)

## 2015-10-27 LAB — HEPATIC FUNCTION PANEL
ALK PHOS: 47 U/L (ref 40–115)
ALT: 26 U/L (ref 9–46)
AST: 31 U/L (ref 10–35)
Albumin: 4.1 g/dL (ref 3.6–5.1)
BILIRUBIN DIRECT: 0.1 mg/dL (ref <=0.2)
BILIRUBIN INDIRECT: 0.4 mg/dL (ref 0.2–1.2)
Total Bilirubin: 0.5 mg/dL (ref 0.2–1.2)
Total Protein: 6.3 g/dL (ref 6.1–8.1)

## 2015-12-19 ENCOUNTER — Other Ambulatory Visit: Payer: Self-pay | Admitting: Physician Assistant

## 2015-12-25 ENCOUNTER — Other Ambulatory Visit: Payer: Self-pay | Admitting: Physician Assistant

## 2016-01-17 ENCOUNTER — Other Ambulatory Visit: Payer: Self-pay | Admitting: Emergency Medicine

## 2016-02-29 ENCOUNTER — Other Ambulatory Visit: Payer: Self-pay | Admitting: Physician Assistant

## 2016-05-03 ENCOUNTER — Encounter: Payer: Self-pay | Admitting: Internal Medicine

## 2016-05-03 ENCOUNTER — Ambulatory Visit (INDEPENDENT_AMBULATORY_CARE_PROVIDER_SITE_OTHER): Payer: 59 | Admitting: Internal Medicine

## 2016-05-03 VITALS — BP 132/84 | HR 72 | Temp 97.5°F | Resp 16 | Ht 72.75 in | Wt 205.2 lb

## 2016-05-03 DIAGNOSIS — I10 Essential (primary) hypertension: Secondary | ICD-10-CM | POA: Diagnosis not present

## 2016-05-03 DIAGNOSIS — Z136 Encounter for screening for cardiovascular disorders: Secondary | ICD-10-CM

## 2016-05-03 DIAGNOSIS — Z125 Encounter for screening for malignant neoplasm of prostate: Secondary | ICD-10-CM

## 2016-05-03 DIAGNOSIS — R7303 Prediabetes: Secondary | ICD-10-CM

## 2016-05-03 DIAGNOSIS — Z0000 Encounter for general adult medical examination without abnormal findings: Secondary | ICD-10-CM

## 2016-05-03 DIAGNOSIS — E349 Endocrine disorder, unspecified: Secondary | ICD-10-CM

## 2016-05-03 DIAGNOSIS — Z111 Encounter for screening for respiratory tuberculosis: Secondary | ICD-10-CM | POA: Diagnosis not present

## 2016-05-03 DIAGNOSIS — K219 Gastro-esophageal reflux disease without esophagitis: Secondary | ICD-10-CM

## 2016-05-03 DIAGNOSIS — R5383 Other fatigue: Secondary | ICD-10-CM

## 2016-05-03 DIAGNOSIS — Z79899 Other long term (current) drug therapy: Secondary | ICD-10-CM

## 2016-05-03 DIAGNOSIS — Z1212 Encounter for screening for malignant neoplasm of rectum: Secondary | ICD-10-CM

## 2016-05-03 DIAGNOSIS — E559 Vitamin D deficiency, unspecified: Secondary | ICD-10-CM

## 2016-05-03 DIAGNOSIS — E782 Mixed hyperlipidemia: Secondary | ICD-10-CM

## 2016-05-03 DIAGNOSIS — Z0001 Encounter for general adult medical examination with abnormal findings: Secondary | ICD-10-CM

## 2016-05-03 LAB — PSA: PSA: 1 ng/mL (ref <=4.0)

## 2016-05-03 LAB — LIPID PANEL
CHOL/HDL RATIO: 5.3 ratio — AB (ref <=5.0)
CHOLESTEROL: 206 mg/dL — AB (ref 125–200)
HDL: 39 mg/dL — ABNORMAL LOW (ref >=40)
LDL Cholesterol: 131 mg/dL — ABNORMAL HIGH (ref <130)
TRIGLYCERIDES: 182 mg/dL — AB (ref <150)
VLDL: 36 mg/dL — AB (ref <30)

## 2016-05-03 LAB — CBC WITH DIFFERENTIAL/PLATELET
BASOS ABS: 63 {cells}/uL (ref 0–200)
Basophils Relative: 1 %
EOS PCT: 9 %
Eosinophils Absolute: 567 cells/uL — ABNORMAL HIGH (ref 15–500)
HCT: 45 % (ref 38.5–50.0)
HEMOGLOBIN: 15.3 g/dL (ref 13.2–17.1)
LYMPHS ABS: 2016 {cells}/uL (ref 850–3900)
Lymphocytes Relative: 32 %
MCH: 30.2 pg (ref 27.0–33.0)
MCHC: 34 g/dL (ref 32.0–36.0)
MCV: 88.9 fL (ref 80.0–100.0)
MPV: 9.5 fL (ref 7.5–12.5)
Monocytes Absolute: 378 cells/uL (ref 200–950)
Monocytes Relative: 6 %
NEUTROS PCT: 52 %
Neutro Abs: 3276 cells/uL (ref 1500–7800)
PLATELETS: 244 10*3/uL (ref 140–400)
RBC: 5.06 MIL/uL (ref 4.20–5.80)
RDW: 13.1 % (ref 11.0–15.0)
WBC: 6.3 10*3/uL (ref 3.8–10.8)

## 2016-05-03 LAB — BASIC METABOLIC PANEL WITH GFR
BUN: 13 mg/dL (ref 7–25)
CALCIUM: 9.3 mg/dL (ref 8.6–10.3)
CO2: 26 mmol/L (ref 20–31)
CREATININE: 0.97 mg/dL (ref 0.70–1.33)
Chloride: 103 mmol/L (ref 98–110)
GFR, EST NON AFRICAN AMERICAN: 86 mL/min (ref >=60)
Glucose, Bld: 85 mg/dL (ref 65–99)
Potassium: 4.2 mmol/L (ref 3.5–5.3)
SODIUM: 137 mmol/L (ref 135–146)

## 2016-05-03 LAB — TSH: TSH: 3.3 mIU/L (ref 0.40–4.50)

## 2016-05-03 LAB — IRON AND TIBC
%SAT: 19 % (ref 15–60)
IRON: 58 ug/dL (ref 50–180)
TIBC: 307 ug/dL (ref 250–425)
UIBC: 249 ug/dL (ref 125–400)

## 2016-05-03 LAB — HEPATIC FUNCTION PANEL
ALT: 17 U/L (ref 9–46)
AST: 21 U/L (ref 10–35)
Albumin: 4.1 g/dL (ref 3.6–5.1)
Alkaline Phosphatase: 45 U/L (ref 40–115)
BILIRUBIN DIRECT: 0.1 mg/dL (ref <=0.2)
BILIRUBIN INDIRECT: 0.3 mg/dL (ref 0.2–1.2)
Total Bilirubin: 0.4 mg/dL (ref 0.2–1.2)
Total Protein: 6.7 g/dL (ref 6.1–8.1)

## 2016-05-03 LAB — VITAMIN B12: Vitamin B-12: 1015 pg/mL (ref 200–1100)

## 2016-05-03 LAB — MAGNESIUM: MAGNESIUM: 2.1 mg/dL (ref 1.5–2.5)

## 2016-05-03 NOTE — Progress Notes (Signed)
[facility] [name], M.D.    Uvaldo Bristle. [name], P.A.-C      Starlyn Skeans, P.A.-C  [facility]                [address] [place] [id number] Telephone [phone] Telefax [phone] Annual  Screening/Preventative Visit  & Comprehensive Evaluation & Examination     This very nice 55 y.o. MWM presents for a Screening/Preventative Visit & comprehensive evaluation and management of multiple medical co-morbidities.  Patient has been followed for HTN, Prediabetes, Hyperlipidemia, Testosterone  and Vitamin D Deficiency.     Labile HTN predates circa 2008 and has been monitored expectantly. Patient's BP has been controlled at home.Today's BP is 132/84.  In 2012 , he had an episode of pAfib and has used Diltiazem on an infrequent prn basis. Patient denies any cardiac symptoms as chest pain, palpitations, shortness of breath, dizziness or ankle swelling.     Patient's hyperlipidemia is controlled with diet and supplements (fish oil). Patient denies myalgias or other medication SE's. Last lipids were not at goal:  Lab Results  Component Value Date   CHOL 206 (H) 05/03/2016   HDL 39 (L) 05/03/2016   LDLCALC 131 (H) 05/03/2016   TRIG 182 (H) 05/03/2016   CHOLHDL 5.3 (H) 05/03/2016      Patient has prediabetes since April 2016 with an A1c 5.9% and patient denies reactive hypoglycemic symptoms, visual blurring, diabetic polys or paresthesias. Last A1c was at goal:  Lab Results  Component Value Date   HGBA1C 5.3 05/03/2016       Patient has been on Testosterone replacement since 2007 for a low T of 224. Finally, patient has history of Vitamin D Deficiency in 2008 of "17" and last vitamin D was at goal: Lab Results  Component Value Date   VD25OH 69 05/03/2016   Current Outpatient Prescriptions on File Prior to Visit  Medication Sig  . ALPRAZolam  0.5 MG  Take 1/2 to 1 tablet 2 x day if  needed for anxiety.  Marland Kitchen VITAMIN C 1000 MG  Take 1,000 mg by mouth daily.  Marland Kitchen aspirin 81 MG  Take 81 mg by mouth daily.  Marland Kitchen VITAMIN D 1000 UNITS  Take 1,000 Units by mouth daily.  . fish oil-omega-3 1000 MG  Take 2 g by mouth daily.  . meloxicam  15 MG  TAKE 1 TABLET EVERY DAY AS NEEDED  . testosterone cypio 200 MG/ML inj INJECT 2 CC INTRAMUSCULARLY EVERY 2 WEEKS   No Known Allergies   Past Medical History:  Diagnosis Date  . Arthritis   . GERD (gastroesophageal reflux disease)   . Hyperlipidemia   . Hypertension   . Other testicular hypofunction   . Prediabetes    Health Maintenance  Topic Date Due  . Hepatitis C Screening  06/21/1961  . HIV Screening  04/15/1974  . COLONOSCOPY  06/16/2019  . TETANUS/TDAP  06/27/2021  . INFLUENZA VACCINE  Completed   Immunization History  Administered Date(s) Administered  . Influenza Split 04/16/2013, 04/22/2014, 04/27/2015  . Influenza-Unspecified 04/18/2016  . PPD Test 04/22/2014, 04/27/2015, 05/03/2016  . Pneumococcal Polysaccharide-23 04/12/2012  . Tdap 06/28/2011   Past Surgical History:  Procedure Laterality Date  . COLONOSCOPY    . KNEE ARTHROSCOPY W/ MENISCAL REPAIR  2009   right  . PROXIMAL INTERPHALANGEAL FUSION (PIP) Left 05/14/2013   Procedure:  RECONSTRUCTION BOUTONNIERE (LEFT SMALL FINGER) PINNING PROXIMAL INTERPHALANGEAL FUSION (PIP);  Surgeon: Wynonia Sours, MD;  Location: [place];  Service: Orthopedics;  Laterality: Left;   Family History  Problem Relation Age of Onset  . Transient ischemic attack Mother   . Stroke Father   . Heart disease Father   . Hypertension Father   . Cancer Maternal Grandmother     colon    Social History   Social History  . Marital status: Married    Spouse name: N/A  . Number of children: N/A  . Years of education: N/A   Occupational History  . 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 Smoking status: Never Smoker  . Smokeless tobacco:  Not on file  . Alcohol use No  . Drug use: No  . Sexual activity: Active    ROS Constitutional: Denies fever, chills, weight loss/gain, headaches, insomnia,  night sweats or change in appetite. Does c/o fatigue. Eyes: Denies redness, blurred vision, diplopia, discharge, itchy or watery eyes.  ENT: Denies discharge, congestion, post nasal drip, epistaxis, sore throat, earache, hearing loss, dental pain, Tinnitus, Vertigo, Sinus pain or snoring.  Cardio: Denies chest pain, palpitations, irregular heartbeat, syncope, dyspnea, diaphoresis, orthopnea, PND, claudication or edema Respiratory: denies cough, dyspnea, DOE, pleurisy, hoarseness, laryngitis or wheezing.  Gastrointestinal: Denies dysphagia, heartburn, reflux, water brash, pain, cramps, nausea, vomiting, bloating, diarrhea, constipation, hematemesis, melena, hematochezia, jaundice or hemorrhoids Genitourinary: Denies dysuria, frequency, urgency, nocturia, hesitancy, discharge, hematuria or flank pain Musculoskeletal: Denies arthralgia, myalgia, stiffness, Jt. Swelling, pain, limp or strain/sprain. Denies Falls. Skin: Denies puritis, rash, hives, warts, acne, eczema or change in skin lesion Neuro: No weakness, tremor, incoordination, spasms, paresthesia or pain Psychiatric: Denies confusion, memory loss or sensory loss. Denies Depression. Endocrine: Denies change in weight, skin, hair change, nocturia, and paresthesia, diabetic polys, visual blurring or hyper / hypo glycemic episodes.  Heme/Lymph: No excessive bleeding, bruising or enlarged lymph nodes.  Physical Exam  BP 132/84   Pulse 72   Temp 97.5 F (36.4 C)   Resp 16   Ht 6' 0.75" (1.848 m)   Wt 205 lb 3.2 oz (93.1 kg)   BMI 27.26 kg/m   General Appearance: Well nourished, in no apparent distress.  Eyes: PERRLA, EOMs, conjunctiva no swelling or erythema, normal fundi and vessels. Sinuses: No frontal/maxillary tenderness ENT/Mouth: EACs patent / TMs  nl. Nares clear without  erythema, swelling, mucoid exudates. Oral hygiene is good. No erythema, swelling, or exudate. Tongue normal, non-obstructing. Tonsils not swollen or erythematous. Hearing normal.  Neck: Supple, thyroid normal. No bruits, nodes or JVD. Respiratory: Respiratory effort normal.  BS equal and clear bilateral without rales, rhonci, wheezing or stridor. Cardio: Heart sounds are normal with regular rate and rhythm and no murmurs, rubs or gallops. Peripheral pulses are normal and equal bilaterally without edema. No aortic or femoral bruits. Chest: symmetric with normal excursions and percussion.  Abdomen: Soft, with Nl bowel sounds. Nontender, no guarding, rebound, hernias, masses, or organomegaly.  Lymphatics: Non tender without lymphadenopathy.  Genitourinary: No hernias.Testes nl. DRE - prostate nl for age - smooth & firm w/o nodules. Musculoskeletal: Full ROM all peripheral extremities, joint stability, 5/5 strength, and normal gait. Skin: Warm and dry without rashes, lesions, cyanosis, clubbing or  ecchymosis.  Neuro: Cranial nerves intact, reflexes equal bilaterally. Normal muscle tone, no cerebellar symptoms. Sensation intact.  Pysch: Alert and oriented X 3 with normal affect, insight and judgment appropriate.  Assessment and Plan  1. Annual Preventative/Screening Exam   - Microalbumin / creatinine urine ratio - EKG 12-Lead - [place], RETROPERITNL ABD,  LTD - POC Hemoccult Bld/Stl  - Urinalysis, Routine w reflex microscopic  - Vitamin B12 - Iron and TIBC - PSA - Testosterone - CBC with Differential/Platelet - BASIC METABOLIC PANEL WITH GFR - Hepatic function panel - Magnesium - Lipid panel - TSH - Hemoglobin A1c - Insulin, random - VITAMIN D 25 Hydroxy  2. Essential hypertension  - Microalbumin / creatinine urine ratio - EKG 12-Lead - [place], RETROPERITNL ABD,  LTD - TSH  3. Mixed hyperlipidemia  - EKG 12-Lead - [place], RETROPERITNL ABD,  LTD - Lipid panel - TSH  4.  Prediabetes  - EKG 12-Lead - [place], RETROPERITNL ABD,  LTD - Hemoglobin A1c - Insulin, random  5. Vitamin D deficiency  - VITAMIN D 25 Hydroxy   6. Testosterone deficiency  - Testosterone  7. Gastroesophageal reflux disease   8. Screening for rectal cancer  - POC Hemoccult Bld/Stl  9. Prostate cancer screening  - PSA  10. Screening examination for pulmonary tuberculosis  - PPD  11. Screening for ischemic heart disease  - EKG 12-Lead  12. Screening for AAA (aortic abdominal aneurysm)  - [place], RETROPERITNL ABD,  LTD  13. Fatigue, unspecified type  - Vitamin B12 - Iron and TIBC - Testosterone - CBC with Differential/Platelet - TSH  14. Medication management  - Urinalysis, Routine w reflex microscopic  - CBC with Differential/Platelet - BASIC METABOLIC PANEL WITH GFR - Hepatic function panel - Magnesium      Continue prudent diet as discussed, weight control, BP monitoring, regular exercise, and medications as discussed.  Discussed med effects and SE's. Routine screening labs and tests as requested with regular follow-up as recommended. Over 40 minutes of exam, counseling, chart review and high complex critical decision making was performed

## 2016-05-03 NOTE — Patient Instructions (Signed)

## 2016-05-04 LAB — MICROALBUMIN / CREATININE URINE RATIO
CREATININE, URINE: 70 mg/dL (ref 20–370)
Microalb Creat Ratio: 3 mcg/mg creat (ref <30)
Microalb, Ur: 0.2 mg/dL

## 2016-05-04 LAB — URINALYSIS, ROUTINE W REFLEX MICROSCOPIC
Bilirubin Urine: NEGATIVE
Glucose, UA: NEGATIVE
Hgb urine dipstick: NEGATIVE
KETONES UR: NEGATIVE
LEUKOCYTES UA: NEGATIVE
NITRITE: NEGATIVE
PH: 6.5 (ref 5.0–8.0)
Protein, ur: NEGATIVE
SPECIFIC GRAVITY, URINE: 1.012 (ref 1.001–1.035)

## 2016-05-04 LAB — HEMOGLOBIN A1C
HEMOGLOBIN A1C: 5.3 % (ref <5.7)
MEAN PLASMA GLUCOSE: 105 mg/dL

## 2016-05-04 LAB — VITAMIN D 25 HYDROXY (VIT D DEFICIENCY, FRACTURES): VIT D 25 HYDROXY: 69 ng/mL (ref 30–100)

## 2016-05-04 LAB — TESTOSTERONE: Testosterone: 513 ng/dL (ref 250–827)

## 2016-05-04 LAB — INSULIN, RANDOM: Insulin: 5.9 u[IU]/mL (ref 2.0–19.6)

## 2016-05-07 ENCOUNTER — Encounter: Payer: Self-pay | Admitting: Internal Medicine

## 2016-06-03 ENCOUNTER — Other Ambulatory Visit: Payer: Self-pay | Admitting: *Deleted

## 2016-06-03 DIAGNOSIS — Z0001 Encounter for general adult medical examination with abnormal findings: Secondary | ICD-10-CM

## 2016-06-03 DIAGNOSIS — Z1212 Encounter for screening for malignant neoplasm of rectum: Secondary | ICD-10-CM

## 2016-06-03 LAB — POC HEMOCCULT BLD/STL (HOME/3-CARD/SCREEN)
Card #3 Fecal Occult Blood, POC: NEGATIVE
FECAL OCCULT BLD: NEGATIVE
Fecal Occult Blood, POC: NEGATIVE

## 2016-06-26 ENCOUNTER — Other Ambulatory Visit: Payer: Self-pay | Admitting: Internal Medicine

## 2016-08-26 DIAGNOSIS — M25562 Pain in left knee: Secondary | ICD-10-CM | POA: Diagnosis not present

## 2016-08-30 ENCOUNTER — Other Ambulatory Visit: Payer: Self-pay | Admitting: Physician Assistant

## 2016-08-30 NOTE — Telephone Encounter (Signed)
Please call Depo - Testosterone 

## 2016-08-31 DIAGNOSIS — M25562 Pain in left knee: Secondary | ICD-10-CM | POA: Diagnosis not present

## 2016-09-08 ENCOUNTER — Encounter (HOSPITAL_BASED_OUTPATIENT_CLINIC_OR_DEPARTMENT_OTHER): Payer: Self-pay | Admitting: *Deleted

## 2016-09-12 NOTE — H&P (Signed)
  Jack Russell comes in as a new patient for evaluation and treatment recommendation, left knee.  He has had some off and on soreness in his knee in the past.  This is on both sides.  Mostly retropatellar with crepitus and occasional discomfort, but not enough to stop him from activity.  New dramatic vertical load twisting injury to his left knee.  He was stepping on wet grass with slippery shoes.  Vertical load twist.  Acute pain medially.  Initially difficult even putting his weight on it.  It has improved to an extent, but he remains very sore on the medial side of his knee and is very hesitant to vertically load and twist this.  Feels like it wants to give way.  Meloxicam helping a little bit.   History is reviewed.  He is status post arthroscopy for complex meniscus tear, right knee, in 2009 by Dr. Alphonzo Cruise.  Some occasional achiness and retropatellar soreness, but overall has done well.  Nothing done operatively on the left.     EXAMINATION: General exam is outlined and included in the chart.  Specifically, 56 year-old male.  Height: 6?0.  Weight: 195 pounds.  Antalgic hesitant gait on the left.  Negative straight leg raise, both sides.  Negative log roll, both hips.  Both knees have 2+ patellofemoral crepitus.  On the right I am not getting joint line tenderness.  Just a little bit of varus.  Stable ligaments.  Full motion.  Neurovascularly intact.  On the left very tender medial joint line.  Positive McMurray's.  Neurovascularly intact distally.  Ligaments stable.    X-RAYS: Four view standing x-ray reveals a little bit of narrowing medial compartment, right greater than left.  No acute bony injury.    IMPRESSION: Medial meniscus tear, left knee.    PLAN: He has had this in the pas on the opposite knee.  Feels like the same thing.  He is going to call when the MRI is complete.  If he has a definite meniscus tear we are going to proceed with arthroscopy and we have discussed that procedure.  Risks,  benefits and complications reviewed.  Paperwork complete.  All questions answered.  Final decision about proceeding depending on his scan.  He understands and agrees.      Addendum: MRI left knee shows medial meniscus tear.  Patient made aware and would like to proceed with above plan.

## 2016-09-15 ENCOUNTER — Ambulatory Visit (HOSPITAL_BASED_OUTPATIENT_CLINIC_OR_DEPARTMENT_OTHER): Payer: 59 | Admitting: Anesthesiology

## 2016-09-15 ENCOUNTER — Encounter (HOSPITAL_BASED_OUTPATIENT_CLINIC_OR_DEPARTMENT_OTHER): Payer: Self-pay | Admitting: *Deleted

## 2016-09-15 ENCOUNTER — Ambulatory Visit (HOSPITAL_BASED_OUTPATIENT_CLINIC_OR_DEPARTMENT_OTHER)
Admission: RE | Admit: 2016-09-15 | Discharge: 2016-09-15 | Disposition: A | Discharge status: Home / Self Care | Payer: 59 | Source: Ambulatory Visit | Attending: Orthopedic Surgery | Admitting: Orthopedic Surgery

## 2016-09-15 ENCOUNTER — Encounter (HOSPITAL_BASED_OUTPATIENT_CLINIC_OR_DEPARTMENT_OTHER)
Admission: RE | Disposition: A | Discharge status: Home / Self Care | Payer: Self-pay | Source: Ambulatory Visit | Attending: Orthopedic Surgery

## 2016-09-15 DIAGNOSIS — Y9389 Activity, other specified: Secondary | ICD-10-CM | POA: Diagnosis not present

## 2016-09-15 DIAGNOSIS — X500XXA Overexertion from strenuous movement or load, initial encounter: Secondary | ICD-10-CM | POA: Insufficient documentation

## 2016-09-15 DIAGNOSIS — S83242A Other tear of medial meniscus, current injury, left knee, initial encounter: Secondary | ICD-10-CM | POA: Insufficient documentation

## 2016-09-15 DIAGNOSIS — M94262 Chondromalacia, left knee: Secondary | ICD-10-CM | POA: Diagnosis not present

## 2016-09-15 DIAGNOSIS — M2342 Loose body in knee, left knee: Secondary | ICD-10-CM | POA: Diagnosis not present

## 2016-09-15 DIAGNOSIS — M2242 Chondromalacia patellae, left knee: Secondary | ICD-10-CM | POA: Diagnosis not present

## 2016-09-15 DIAGNOSIS — S83241A Other tear of medial meniscus, current injury, right knee, initial encounter: Secondary | ICD-10-CM | POA: Diagnosis not present

## 2016-09-15 DIAGNOSIS — M2240 Chondromalacia patellae, unspecified knee: Secondary | ICD-10-CM | POA: Diagnosis present

## 2016-09-15 HISTORY — PX: CHONDROPLASTY: SHX5177

## 2016-09-15 HISTORY — PX: KNEE ARTHROSCOPY WITH MEDIAL MENISECTOMY: SHX5651

## 2016-09-15 SURGERY — ARTHROSCOPY, KNEE, WITH MEDIAL MENISCECTOMY
Anesthesia: General | Site: Knee | Laterality: Left

## 2016-09-15 MED ORDER — FENTANYL CITRATE (PF) 100 MCG/2ML IJ SOLN
50.0000 ug | INTRAMUSCULAR | Status: DC | PRN
  Administered 2016-09-15: 50 ug via INTRAVENOUS
  Administered 2016-09-15: 100 ug via INTRAVENOUS

## 2016-09-15 MED ORDER — LACTATED RINGERS IV SOLN: INTRAVENOUS | Status: DC

## 2016-09-15 MED ORDER — LIDOCAINE 2% (20 MG/ML) 5 ML SYRINGE
INTRAMUSCULAR | Status: DC | PRN
  Administered 2016-09-15: 50 mg via INTRAVENOUS

## 2016-09-15 MED ORDER — DEXAMETHASONE SODIUM PHOSPHATE 4 MG/ML IJ SOLN
INTRAMUSCULAR | Status: DC | PRN
  Administered 2016-09-15: 10 mg via INTRAVENOUS

## 2016-09-15 MED ORDER — SCOPOLAMINE 1 MG/3DAYS TD PT72: 1.0000 | MEDICATED_PATCH | Freq: Once | TRANSDERMAL | Status: DC | PRN

## 2016-09-15 MED ORDER — FENTANYL CITRATE (PF) 100 MCG/2ML IJ SOLN: 25.0000 ug | INTRAMUSCULAR | Status: DC | PRN

## 2016-09-15 MED ORDER — SODIUM CHLORIDE 0.9 % IR SOLN
Status: DC | PRN
  Administered 2016-09-15: 4500 mL

## 2016-09-15 MED ORDER — CEFAZOLIN SODIUM-DEXTROSE 2-4 GM/100ML-% IV SOLN
2.0000 g | INTRAVENOUS | Status: AC
  Administered 2016-09-15: 2 g via INTRAVENOUS

## 2016-09-15 MED ORDER — MEPERIDINE HCL 25 MG/ML IJ SOLN: 6.2500 mg | INTRAMUSCULAR | Status: DC | PRN

## 2016-09-15 MED ORDER — MIDAZOLAM HCL 2 MG/2ML IJ SOLN
1.0000 mg | INTRAMUSCULAR | Status: DC | PRN
  Administered 2016-09-15: 2 mg via INTRAVENOUS

## 2016-09-15 MED ORDER — METHYLPREDNISOLONE ACETATE 80 MG/ML IJ SUSP
INTRAMUSCULAR | Status: AC
  Filled 2016-09-15: qty 1

## 2016-09-15 MED ORDER — LACTATED RINGERS IV SOLN
INTRAVENOUS | Status: DC
  Administered 2016-09-15: 07:00:00 via INTRAVENOUS

## 2016-09-15 MED ORDER — MIDAZOLAM HCL 2 MG/2ML IJ SOLN
INTRAMUSCULAR | Status: AC
  Filled 2016-09-15: qty 2

## 2016-09-15 MED ORDER — HYDROCODONE-ACETAMINOPHEN 7.5-325 MG PO TABS: 1.0000 | ORAL_TABLET | Freq: Once | ORAL | Status: DC | PRN

## 2016-09-15 MED ORDER — METHYLPREDNISOLONE ACETATE 80 MG/ML IJ SUSP
INTRAMUSCULAR | Status: DC | PRN
  Administered 2016-09-15: 80 mg via INTRA_ARTICULAR

## 2016-09-15 MED ORDER — FENTANYL CITRATE (PF) 100 MCG/2ML IJ SOLN
INTRAMUSCULAR | Status: AC
  Filled 2016-09-15: qty 2

## 2016-09-15 MED ORDER — BUPIVACAINE HCL (PF) 0.5 % IJ SOLN
INTRAMUSCULAR | Status: DC | PRN
  Administered 2016-09-15: 20 mL via INTRA_ARTICULAR

## 2016-09-15 MED ORDER — CEFAZOLIN SODIUM-DEXTROSE 2-4 GM/100ML-% IV SOLN
INTRAVENOUS | Status: AC
  Filled 2016-09-15: qty 100

## 2016-09-15 MED ORDER — ONDANSETRON HCL 4 MG/2ML IJ SOLN: 4.0000 mg | Freq: Once | INTRAMUSCULAR | Status: DC | PRN

## 2016-09-15 MED ORDER — OXYCODONE-ACETAMINOPHEN 5-325 MG PO TABS: 1.0000 | ORAL_TABLET | ORAL | 0 refills | Status: DC | PRN

## 2016-09-15 MED ORDER — CHLORHEXIDINE GLUCONATE 4 % EX LIQD: 60.0000 mL | Freq: Once | CUTANEOUS | Status: DC

## 2016-09-15 MED ORDER — KETOROLAC TROMETHAMINE 30 MG/ML IJ SOLN
INTRAMUSCULAR | Status: AC
  Filled 2016-09-15: qty 1

## 2016-09-15 MED ORDER — KETOROLAC TROMETHAMINE 30 MG/ML IJ SOLN
30.0000 mg | Freq: Once | INTRAMUSCULAR | Status: AC
Start: 2016-09-15 — End: 2016-09-15
  Administered 2016-09-15: 30 mg via INTRAVENOUS

## 2016-09-15 MED ORDER — ONDANSETRON HCL 4 MG/2ML IJ SOLN
INTRAMUSCULAR | Status: DC | PRN
  Administered 2016-09-15: 4 mg via INTRAVENOUS

## 2016-09-15 MED ORDER — ONDANSETRON HCL 4 MG/2ML IJ SOLN
INTRAMUSCULAR | Status: AC
  Filled 2016-09-15: qty 2

## 2016-09-15 MED ORDER — IBUPROFEN 100 MG/5ML PO SUSP: 200.0000 mg | Freq: Four times a day (QID) | ORAL | Status: DC | PRN

## 2016-09-15 MED ORDER — LIDOCAINE 2% (20 MG/ML) 5 ML SYRINGE
INTRAMUSCULAR | Status: AC
  Filled 2016-09-15: qty 5

## 2016-09-15 MED ORDER — DEXAMETHASONE SODIUM PHOSPHATE 10 MG/ML IJ SOLN
INTRAMUSCULAR | Status: AC
  Filled 2016-09-15: qty 1

## 2016-09-15 MED ORDER — IBUPROFEN 200 MG PO TABS: 200.0000 mg | ORAL_TABLET | Freq: Four times a day (QID) | ORAL | Status: DC | PRN

## 2016-09-15 MED ORDER — ONDANSETRON HCL 4 MG PO TABS: 4.0000 mg | ORAL_TABLET | Freq: Three times a day (TID) | ORAL | 0 refills | Status: DC | PRN

## 2016-09-15 MED ORDER — PROPOFOL 500 MG/50ML IV EMUL
INTRAVENOUS | Status: AC
  Filled 2016-09-15: qty 50

## 2016-09-15 MED ORDER — BUPIVACAINE HCL (PF) 0.5 % IJ SOLN
INTRAMUSCULAR | Status: AC
  Filled 2016-09-15: qty 30

## 2016-09-15 MED ORDER — PROPOFOL 10 MG/ML IV BOLUS
INTRAVENOUS | Status: DC | PRN
  Administered 2016-09-15: 200 mg via INTRAVENOUS

## 2016-09-15 SURGICAL SUPPLY — 41 items
BANDAGE ACE 6X5 VEL STRL LF (GAUZE/BANDAGES/DRESSINGS) ×2 IMPLANT
BLADE CUDA 5.5 (BLADE) IMPLANT
BLADE CUDA GRT WHITE 3.5 (BLADE) IMPLANT
BLADE CUTTER GATOR 3.5 (BLADE) ×2 IMPLANT
BLADE CUTTER MENIS 5.5 (BLADE) IMPLANT
BLADE GREAT WHITE 4.2 (BLADE) ×2 IMPLANT
BUR OVAL 4.0 (BURR) IMPLANT
CUTTER MENISCUS  4.2MM (BLADE)
CUTTER MENISCUS 4.2MM (BLADE) IMPLANT
DECANTER SPIKE VIAL GLASS SM (MISCELLANEOUS) ×2 IMPLANT
DRAPE ARTHROSCOPY W/POUCH 90 (DRAPES) ×2 IMPLANT
DURAPREP 26ML APPLICATOR (WOUND CARE) ×2 IMPLANT
ELECT MENISCUS 165MM 90D (ELECTRODE) IMPLANT
ELECT REM PT RETURN 9FT ADLT (ELECTROSURGICAL)
ELECTRODE REM PT RTRN 9FT ADLT (ELECTROSURGICAL) IMPLANT
GAUZE SPONGE 4X4 12PLY STRL (GAUZE/BANDAGES/DRESSINGS) ×2 IMPLANT
GAUZE XEROFORM 1X8 LF (GAUZE/BANDAGES/DRESSINGS) ×2 IMPLANT
GLOVE BIOGEL PI IND STRL 7.0 (GLOVE) ×4 IMPLANT
GLOVE BIOGEL PI INDICATOR 7.0 (GLOVE) ×4
GLOVE ECLIPSE 6.5 STRL STRAW (GLOVE) ×2 IMPLANT
GLOVE ECLIPSE 7.0 STRL STRAW (GLOVE) IMPLANT
GLOVE SURG ORTHO 8.0 STRL STRW (GLOVE) ×2 IMPLANT
GLOVE SURG SS PI 6.5 STRL IVOR (GLOVE) ×2 IMPLANT
GOWN STRL REUS W/ TWL LRG LVL3 (GOWN DISPOSABLE) ×1 IMPLANT
GOWN STRL REUS W/ TWL XL LVL3 (GOWN DISPOSABLE) ×3 IMPLANT
GOWN STRL REUS W/TWL LRG LVL3 (GOWN DISPOSABLE) ×1
GOWN STRL REUS W/TWL XL LVL3 (GOWN DISPOSABLE) ×3
HOLDER KNEE FOAM BLUE (MISCELLANEOUS) ×2 IMPLANT
IV NS IRRIG 3000ML ARTHROMATIC (IV SOLUTION) ×4 IMPLANT
KNEE WRAP E Z 3 GEL PACK (MISCELLANEOUS) ×2 IMPLANT
MANIFOLD NEPTUNE II (INSTRUMENTS) ×2 IMPLANT
PACK ARTHROSCOPY DSU (CUSTOM PROCEDURE TRAY) ×2 IMPLANT
PACK BASIN DAY SURGERY FS (CUSTOM PROCEDURE TRAY) ×2 IMPLANT
PENCIL BUTTON HOLSTER BLD 10FT (ELECTRODE) IMPLANT
SET ARTHROSCOPY TUBING (MISCELLANEOUS) ×1
SET ARTHROSCOPY TUBING LN (MISCELLANEOUS) ×1 IMPLANT
SUT ETHILON 3 0 PS 1 (SUTURE) ×2 IMPLANT
SUT VIC AB 3-0 FS2 27 (SUTURE) IMPLANT
SYR 3ML 23GX1 SAFETY (SYRINGE) ×2 IMPLANT
TOWEL OR 17X24 6PK STRL BLUE (TOWEL DISPOSABLE) ×2 IMPLANT
WATER STERILE IRR 1000ML POUR (IV SOLUTION) ×2 IMPLANT

## 2016-09-15 NOTE — Interval H&P Note (Signed)
History and Physical Interval Note:  09/15/2016 7:39 AM  Jack Russell  has presented today for surgery, with the diagnosis of Chondromalcia patellae, left knee, other tear of medial meniscus, current injury, unspecified knee, initial encounter, LEFT  M22.42, S83.249A  The various methods of treatment have been discussed with the patient and family. After consideration of risks, benefits and other options for treatment, the patient has consented to  Procedure(s): LEFT KNEE ARTHROSCOPY CHONDROPLASTY  WITH MEDIAL MENISECTOMY (Left) as a surgical intervention .  The patient's history has been reviewed, patient examined, no change in status, stable for surgery.  I have reviewed the patient's chart and labs.  Questions were answered to the patient's satisfaction.     Ninetta Lights

## 2016-09-15 NOTE — Anesthesia Postprocedure Evaluation (Addendum)
Anesthesia Post Note  Patient: Jack Russell  Procedure(s) Performed: Procedure(s) (LRB): LEFT KNEE ARTHROSCOPY CHONDROPLASTY  WITH MEDIAL MENISECTOMY (Left) CHONDROPLASTY (Left)  Patient location during evaluation: PACU Anesthesia Type: General Level of consciousness: awake Pain management: pain level controlled Vital Signs Assessment: post-procedure vital signs reviewed and stable Respiratory status: spontaneous breathing Cardiovascular status: stable Postop Assessment: no signs of nausea or vomiting Anesthetic complications: no        Last Vitals:  Vitals:   09/15/16 0915 09/15/16 0930  BP: 121/84 126/87  Pulse: 72 76  Resp: 14 14  Temp:      Last Pain:  Vitals:   09/15/16 0930  TempSrc:   PainSc: 0-No pain   Pain Goal:                 Emmalynne Courtney JR,JOHN Slayde Brault

## 2016-09-15 NOTE — Discharge Instructions (Signed)

## 2016-09-15 NOTE — Anesthesia Procedure Notes (Signed)
Procedure Name: LMA Insertion Performed by: Natesha Hassey W Pre-anesthesia Checklist: Patient identified, Emergency Drugs available, Suction available and Patient being monitored Patient Re-evaluated:Patient Re-evaluated prior to inductionOxygen Delivery Method: Circle system utilized Preoxygenation: Pre-oxygenation with 100% oxygen Intubation Type: IV induction Ventilation: Mask ventilation without difficulty LMA: LMA inserted LMA Size: 5.0 Number of attempts: 1 Placement Confirmation: positive ETCO2 Tube secured with: Tape Dental Injury: Teeth and Oropharynx as per pre-operative assessment        

## 2016-09-15 NOTE — Transfer of Care (Signed)
Immediate Anesthesia Transfer of Care Note  Patient: Jack Russell  Procedure(s) Performed: Procedure(s): LEFT KNEE ARTHROSCOPY CHONDROPLASTY  WITH MEDIAL MENISECTOMY (Left) CHONDROPLASTY (Left)  Patient Location: PACU  Anesthesia Type:General  Level of Consciousness: awake and oriented  Airway & Oxygen Therapy: Patient Spontanous Breathing and Patient connected to face mask oxygen  Post-op Assessment: Report given to RN and Post -op Vital signs reviewed and stable  Post vital signs: Reviewed and stable  Last Vitals:  Vitals:   09/15/16 0629 09/15/16 0831  BP: 131/85 102/61  Pulse: 80   Resp: 18   Temp: 36.6 C     Last Pain:  Vitals:   09/15/16 0629  TempSrc: Oral         Complications: No apparent anesthesia complications

## 2016-09-15 NOTE — Op Note (Signed)
NAME:  FINNAN, WONG NO.:  [id number]  MEDICAL RECORD NO.:  [id number]  LOCATION:                                 FACILITY:  PHYSICIAN:  Ninetta Lights, M.D. DATE OF BIRTH:  10/19/1960  DATE OF PROCEDURE:  09/15/2016 DATE OF DISCHARGE:                              OPERATIVE REPORT   PREOPERATIVE DIAGNOSES:  Left knee medial meniscus tear, chondromalacia of patella.  POSTOPERATIVE DIAGNOSES:  Left knee medial meniscus tear, chondromalacia of patella.  PROCEDURE:  Left knee exam under anesthesia and arthroscopy.  Partial medial meniscectomy.  Chondroplasty, patellofemoral joint as well as medial femoral condyle.  SURGEON:  Ninetta Lights, M.D.  ASSISTANT:  Elmyra Ricks, PA.  ANESTHESIA:  General.  BLOOD LOSS:  Minimal.  SPECIMENS:  None.  CULTURES:  None.  COMPLICATIONS:  None.  DRESSINGS:  Soft compressive.  TOURNIQUET:  Not employed.  DESCRIPTION OF PROCEDURE:  The patient was brought to the operating room and after adequate anesthesia had been obtained, leg holder applied, leg prepped and draped in usual sterile fashion.  Two portals; one each medial and lateral parapatellar.  Arthroscope was introduced.  Knee distended and inspected.  Some grade 2 and 3 changes in peak of patella laterally and trochlea, all debrided to a stable surface.  Chondral loose bodies removed.  ACL intact.  Lateral meniscus lateral compartment looked good.  Medial meniscus radial tear, posterior third.  Saucerized out on either side, then tapered in smoothly.  Some focal grade 2 and 3 changes on the condyle debrided.  Entire knee examined.  No other findings were appreciated.  Instruments were removed.  Portals were closed with nylon.  Sterile compressive dressing applied.  Anesthesia reversed.  Brought to the recovery room.  Tolerated the surgery well.  No complications.     Ninetta Lights, M.D.   ______________________________ Ninetta Lights,  M.D.    DFM/MEDQ  D:  09/15/2016  T:  09/15/2016  Job:  [id number]

## 2016-09-15 NOTE — Anesthesia Preprocedure Evaluation (Signed)
Anesthesia Evaluation  Patient identified by MRN, date of birth, ID band Patient awake    Reviewed: Allergy & Precautions, H&P , NPO status , Patient's Chart, lab work & pertinent test results  Airway Mallampati: I       Dental no notable dental hx.    Pulmonary neg pulmonary ROS,    breath sounds clear to auscultation       Cardiovascular negative cardio ROS Normal cardiovascular exam     Neuro/Psych negative neurological ROS  negative psych ROS   GI/Hepatic Neg liver ROS,   Endo/Other  negative endocrine ROS  Renal/GU negative Renal ROS  negative genitourinary   Musculoskeletal   Abdominal   Peds  Hematology negative hematology ROS (+)   Anesthesia Other Findings   Reproductive/Obstetrics                             Anesthesia Physical Anesthesia Plan  ASA: II  Anesthesia Plan: General   Post-op Pain Management:    Induction: Intravenous  Airway Management Planned: Oral ETT  Additional Equipment:   Intra-op Plan:   Post-operative Plan:   Informed Consent: I have reviewed the patients History and Physical, chart, labs and discussed the procedure including the risks, benefits and alternatives for the proposed anesthesia with the patient or authorized representative who has indicated his/her understanding and acceptance.     Plan Discussed with: CRNA and Surgeon  Anesthesia Plan Comments:         Anesthesia Quick Evaluation

## 2016-09-16 ENCOUNTER — Encounter (HOSPITAL_BASED_OUTPATIENT_CLINIC_OR_DEPARTMENT_OTHER): Payer: Self-pay | Admitting: Orthopedic Surgery

## 2016-09-20 DIAGNOSIS — S83241D Other tear of medial meniscus, current injury, right knee, subsequent encounter: Secondary | ICD-10-CM | POA: Diagnosis not present

## 2016-09-29 ENCOUNTER — Ambulatory Visit (INDEPENDENT_AMBULATORY_CARE_PROVIDER_SITE_OTHER): Payer: 59 | Admitting: Physician Assistant

## 2016-09-29 ENCOUNTER — Other Ambulatory Visit: Payer: Self-pay | Admitting: Physician Assistant

## 2016-09-29 VITALS — BP 126/80 | HR 82 | Temp 97.5°F | Resp 14 | Ht 72.75 in | Wt 203.0 lb

## 2016-09-29 DIAGNOSIS — D62 Acute posthemorrhagic anemia: Secondary | ICD-10-CM

## 2016-09-29 DIAGNOSIS — R55 Syncope and collapse: Secondary | ICD-10-CM

## 2016-09-29 DIAGNOSIS — R0600 Dyspnea, unspecified: Secondary | ICD-10-CM

## 2016-09-29 DIAGNOSIS — K921 Melena: Secondary | ICD-10-CM | POA: Diagnosis not present

## 2016-09-29 DIAGNOSIS — I480 Paroxysmal atrial fibrillation: Secondary | ICD-10-CM | POA: Diagnosis not present

## 2016-09-29 LAB — COMPREHENSIVE METABOLIC PANEL
ALBUMIN: 4 g/dL (ref 3.6–5.1)
ALK PHOS: 41 U/L (ref 40–115)
ALT: 14 U/L (ref 9–46)
AST: 18 U/L (ref 10–35)
BILIRUBIN TOTAL: 0.4 mg/dL (ref 0.2–1.2)
BUN: 22 mg/dL (ref 7–25)
CALCIUM: 8.4 mg/dL — AB (ref 8.6–10.3)
CO2: 26 mmol/L (ref 20–31)
CREATININE: 0.94 mg/dL (ref 0.70–1.33)
Chloride: 106 mmol/L (ref 98–110)
Glucose, Bld: 103 mg/dL — ABNORMAL HIGH (ref 65–99)
Potassium: 3.4 mmol/L — ABNORMAL LOW (ref 3.5–5.3)
SODIUM: 141 mmol/L (ref 135–146)
TOTAL PROTEIN: 6 g/dL — AB (ref 6.1–8.1)

## 2016-09-29 LAB — CBC WITH DIFFERENTIAL/PLATELET
Basophils Absolute: 80 cells/uL (ref 0–200)
Basophils Relative: 1 %
EOS PCT: 2 %
Eosinophils Absolute: 160 cells/uL (ref 15–500)
HEMATOCRIT: 25.5 % — AB (ref 38.5–50.0)
HEMOGLOBIN: 8.5 g/dL — AB (ref 13.2–17.1)
LYMPHS ABS: 2000 {cells}/uL (ref 850–3900)
Lymphocytes Relative: 25 %
MCH: 31.3 pg (ref 27.0–33.0)
MCHC: 33.3 g/dL (ref 32.0–36.0)
MCV: 93.8 fL (ref 80.0–100.0)
MONO ABS: 400 {cells}/uL (ref 200–950)
MPV: 9.5 fL (ref 7.5–12.5)
Monocytes Relative: 5 %
NEUTROS ABS: 5360 {cells}/uL (ref 1500–7800)
NEUTROS PCT: 67 %
Platelets: 293 10*3/uL (ref 140–400)
RBC: 2.72 MIL/uL — AB (ref 4.20–5.80)
RDW: 14.5 % (ref 11.0–15.0)
WBC: 8 10*3/uL (ref 3.8–10.8)

## 2016-09-29 LAB — CK TOTAL AND CKMB (NOT AT ARMC)
CK TOTAL: 107 U/L (ref 7–232)
CK, MB: 3.1 ng/mL (ref 0.0–5.0)
Relative Index: 2.9 (ref 0.0–4.0)

## 2016-09-29 LAB — TROPONIN I: TROPONIN I: 0.01 ng/mL (ref <=0.05)

## 2016-09-29 LAB — D-DIMER, QUANTITATIVE: D-Dimer, Quant: 0.42 mcg/mL FEU (ref <0.50)

## 2016-09-29 MED ORDER — SUCRALFATE 1 G PO TABS: 1.0000 g | ORAL_TABLET | Freq: Three times a day (TID) | ORAL | 1 refills | Status: DC

## 2016-09-29 MED ORDER — HYDROCORTISONE 2.5 % RE CREA: TOPICAL_CREAM | RECTAL | 1 refills | Status: AC

## 2016-09-29 NOTE — Progress Notes (Signed)
Sent in carafate and hydrocortisone cream

## 2016-09-29 NOTE — Progress Notes (Signed)
Subjective:    Patient ID: Jack Russell, male    DOB: 08-30-1960, 56 y.o.   MRN: [id number]  HPI 56 y.o. WM with history of p Afib, HTN, GERD, RLS, chol, testosterone use presents with several symptoms. He had left knee arthroscopy with Dr. Percell Miller on 02/22 and states it helped his knee significantly, started walking same day.   Since the surgery he has had left thigh pain worse with sitting for a long time, getting up in the AM, or at night while changing positions has pain x 30 seconds. Denies swelling, warmth, redness in his calf. No family history of blood clots, no personal history of blood clots.   He also complains of SOB with minimal exertion x this week. States he can normally feel when he is has afib, does not feel like he has afib. BP has been 160's with exertion. He was having chest tightness with exertion, could feel heart beat "hard" but states was not irregular. Monday at home felt weakness, nausea, went to bed 830pm, and woke up to urinate and had syncopal episode, had nausea and dizziness, no loss of bladder/bowel control. Very weak Monday but no weakness now, just SOB and fatigue.   He has had black stools, denies pepto or iron use. Had constipation after the surgery and then noticed black stools, has had total of 14-15 black stools since surgery. He is on 81mg  ASA, no ETOH, on meloxicam QOD.   Denies fever, chills, dizziness.   Blood pressure 126/80, pulse 82, temperature 97.5 F (36.4 C), resp. rate 14, height 6' 0.75" (1.848 m), weight 203 lb (92.1 kg), SpO2 97 %.  Medications Current Outpatient Prescriptions on File Prior to Visit  Medication Sig  . Ascorbic Acid (VITAMIN C) 1000 MG tablet Take 1,000 mg by mouth daily.  Marland Kitchen aspirin 81 MG tablet Take 81 mg by mouth daily.  . cholecalciferol (VITAMIN D) 1000 UNITS tablet Take 1,000 Units by mouth daily.  . fish oil-omega-3 fatty acids 1000 MG capsule Take 2 g by mouth daily.  Marland Kitchen testosterone cypionate (DEPOTESTOSTERONE  CYPIONATE) 200 MG/ML injection INJECT 2CC INTRAMUSCULARLY EVERY 2 WEEKS   No current facility-administered medications on file prior to visit.     Problem list He has GERD (gastroesophageal reflux disease); Hypertension; Testosterone deficiency; Prediabetes; Hyperlipidemia; Vitamin D deficiency; Medication management; BMI 26.0-26.9,adult; RLS (restless legs syndrome); and Paroxysmal a-fib (HCC) on his problem list.  Review of Systems  Constitutional: Positive for fatigue. Negative for appetite change, chills and fever.  HENT: Negative.   Respiratory: Positive for chest tightness and shortness of breath. Negative for apnea, cough, choking, wheezing and stridor.   Cardiovascular: Negative.   Gastrointestinal: Positive for blood in stool. Negative for abdominal distention, abdominal pain, anal bleeding, constipation, diarrhea, nausea, rectal pain and vomiting.  Genitourinary: Negative.   Musculoskeletal: Negative.   Skin: Negative.  Negative for rash.  Neurological: Positive for syncope. Negative for dizziness.  Hematological: Negative.  Does not bruise/bleed easily.       Objective:   Physical Exam  Constitutional: He is oriented to person, place, and time. He appears well-developed and well-nourished. No distress.  Appears pale  HENT:  Head: Normocephalic and atraumatic.  Right Ear: External ear normal.  Left Ear: External ear normal.  Mouth/Throat: Oropharynx is clear and moist.  Eyes: Conjunctivae and EOM are normal. Pupils are equal, round, and reactive to light.  Neck: Normal range of motion. Neck supple.  Cardiovascular: Normal rate, regular rhythm and normal heart  sounds.  Exam reveals no gallop and no friction rub.   No murmur heard. Pulmonary/Chest: Effort normal and breath sounds normal.  Abdominal: Soft. Bowel sounds are normal. He exhibits no mass. There is tenderness (epigastric). There is no rebound and no guarding.  Genitourinary: Rectal exam shows guaiac positive  stool.  Musculoskeletal: Normal range of motion. He exhibits no edema or tenderness.  nontender bilateral legs, no swelling, no warmth, no hard palpable cord, neg homen's  Neurological: He is alert and oriented to person, place, and time. No cranial nerve deficit.  Skin: Skin is warm and dry. He is not diaphoretic.       Assessment & Plan:  Jack Russell was seen today for acute visit, shortness of breath, headache and dizziness.  Diagnoses and all orders for this visit:  Dyspnea, unspecified type with 1 episode of syncope- ? postmicturition  Low PE risk other than recent knee arthroscopy, more likely with several + bloody stools may be anemia- will start on dexilant 2 today and then one a day, will get stat labs has xarelto but will not start until Ddimer comes back  EKG WNL, NSR, no afib -     EKG 12-Lead -     CBC with Differential/Platelet -     Comprehensive metabolic panel -     D-dimer, quantitative (not at [facility]) -     CK total and CKMB (cardiac)not at [facility] -     Troponin I  Melena -     EKG 12-Lead -     CBC with Differential/Platelet -     Comprehensive metabolic panel -     D-dimer, quantitative (not at [facility]) -     CK total and CKMB (cardiac)not at [facility] -     Troponin I

## 2016-09-29 NOTE — Patient Instructions (Addendum)
Getting stat labs Will start on dexilant samples, take one now and once daily Will start on xarelto 15mg  samples, start daily until we tell you to stop If any worsening shortness of breath, any chest pain, any worsening symptoms go to the ER  Shortness of Breath, Adult Shortness of breath is when a person has trouble breathing enough air, or when a person feels like she or he is having trouble breathing in enough air. Shortness of breath could be a sign of medical problem. Follow these instructions at home: Pay attention to any changes in your symptoms. Take these actions to help with your condition:  Do not smoke. Smoking is a common cause of shortness of breath. If you smoke and you need help quitting, ask your health care provider.  Avoid things that can irritate your airways, such as:  Mold.  Dust.  Air pollution.  Chemical fumes.  Things that can cause allergy symptoms (allergens), if you have allergies.  Keep your living space clean and free of mold and dust.  Rest as needed. Slowly return to your usual activities.  Take over-the-counter and prescription medicines, including oxygen and inhaled medicines, only as told by your health care provider.  Keep all follow-up visits as told by your health care provider. This is important. Contact a health care provider if:  Your condition does not improve as soon as expected.  You have a hard time doing your normal activities, even after you rest.  You have new symptoms. Get help right away if:  Your shortness of breath gets worse.  You have shortness of breath when you are resting.  You feel light-headed or you faint.  You have a cough that is not controlled with medicines.  You cough up blood.  You have pain with breathing.  You have pain in your chest, arms, shoulders, or abdomen.  You have a fever.  You cannot walk up stairs or exercise the way that you normally do. This information is not intended to replace  advice given to you by your health care provider. Make sure you discuss any questions you have with your health care provider. Document Released: 04/05/2001 Document Revised: 01/30/2016 Document Reviewed: 12/17/2015 Elsevier Interactive Patient Education  2017 [employer].

## 2016-09-30 ENCOUNTER — Other Ambulatory Visit: Payer: 59

## 2016-09-30 ENCOUNTER — Encounter: Payer: Self-pay | Admitting: Gastroenterology

## 2016-09-30 DIAGNOSIS — D62 Acute posthemorrhagic anemia: Secondary | ICD-10-CM

## 2016-09-30 DIAGNOSIS — K921 Melena: Secondary | ICD-10-CM

## 2016-09-30 LAB — CBC WITH DIFFERENTIAL/PLATELET
Basophils Absolute: 56 {cells}/uL (ref 0–200)
Basophils Relative: 1 %
Eosinophils Absolute: 168 {cells}/uL (ref 15–500)
Eosinophils Relative: 3 %
HCT: 24.5 % — ABNORMAL LOW (ref 38.5–50.0)
Hemoglobin: 8.1 g/dL — ABNORMAL LOW (ref 13.2–17.1)
Lymphocytes Relative: 26 %
Lymphs Abs: 1456 {cells}/uL (ref 850–3900)
MCH: 31.2 pg (ref 27.0–33.0)
MCHC: 33.1 g/dL (ref 32.0–36.0)
MCV: 94.2 fL (ref 80.0–100.0)
MPV: 9.4 fL (ref 7.5–12.5)
Monocytes Absolute: 336 {cells}/uL (ref 200–950)
Monocytes Relative: 6 %
Neutro Abs: 3584 {cells}/uL (ref 1500–7800)
Neutrophils Relative %: 64 %
Platelets: 291 10*3/uL (ref 140–400)
RBC: 2.6 MIL/uL — ABNORMAL LOW (ref 4.20–5.80)
RDW: 14.5 % (ref 11.0–15.0)
WBC: 5.6 10*3/uL (ref 3.8–10.8)

## 2016-09-30 NOTE — Progress Notes (Signed)
Patient states stool was better this AM, will go to Er over the weekend if any worsening symptoms. Will follow up Monday for stat labs.

## 2016-10-03 ENCOUNTER — Other Ambulatory Visit: Payer: 59

## 2016-10-03 ENCOUNTER — Other Ambulatory Visit: Payer: Self-pay | Admitting: Physician Assistant

## 2016-10-03 DIAGNOSIS — K921 Melena: Secondary | ICD-10-CM

## 2016-10-03 LAB — CBC WITH DIFFERENTIAL/PLATELET
Basophils Absolute: 60 cells/uL (ref 0–200)
Basophils Relative: 1 %
EOS ABS: 180 {cells}/uL (ref 15–500)
Eosinophils Relative: 3 %
HEMATOCRIT: 30.1 % — AB (ref 38.5–50.0)
HEMOGLOBIN: 10 g/dL — AB (ref 13.2–17.1)
LYMPHS ABS: 1560 {cells}/uL (ref 850–3900)
LYMPHS PCT: 26 %
MCH: 31.3 pg (ref 27.0–33.0)
MCHC: 33.2 g/dL (ref 32.0–36.0)
MCV: 94.1 fL (ref 80.0–100.0)
MONO ABS: 420 {cells}/uL (ref 200–950)
MPV: 9.6 fL (ref 7.5–12.5)
Monocytes Relative: 7 %
Neutro Abs: 3780 cells/uL (ref 1500–7800)
Neutrophils Relative %: 63 %
Platelets: 422 10*3/uL — ABNORMAL HIGH (ref 140–400)
RBC: 3.2 MIL/uL — AB (ref 4.20–5.80)
RDW: 14.6 % (ref 11.0–15.0)
WBC: 6 10*3/uL (ref 3.8–10.8)

## 2016-10-03 LAB — COMPREHENSIVE METABOLIC PANEL
ALBUMIN: 4.1 g/dL (ref 3.6–5.1)
ALT: 14 U/L (ref 9–46)
AST: 13 U/L (ref 10–35)
Alkaline Phosphatase: 48 U/L (ref 40–115)
BILIRUBIN TOTAL: 0.4 mg/dL (ref 0.2–1.2)
BUN: 15 mg/dL (ref 7–25)
CO2: 30 mmol/L (ref 20–31)
Calcium: 9.4 mg/dL (ref 8.6–10.3)
Chloride: 104 mmol/L (ref 98–110)
Creat: 1.1 mg/dL (ref 0.70–1.33)
Glucose, Bld: 90 mg/dL (ref 65–99)
POTASSIUM: 4.3 mmol/L (ref 3.5–5.3)
Sodium: 138 mmol/L (ref 135–146)
Total Protein: 6.5 g/dL (ref 6.1–8.1)

## 2016-10-04 ENCOUNTER — Ambulatory Visit (INDEPENDENT_AMBULATORY_CARE_PROVIDER_SITE_OTHER): Payer: 59 | Admitting: Gastroenterology

## 2016-10-04 ENCOUNTER — Encounter: Payer: Self-pay | Admitting: Gastroenterology

## 2016-10-04 VITALS — BP 120/78 | HR 80 | Ht 72.75 in | Wt 204.4 lb

## 2016-10-04 DIAGNOSIS — Z0001 Encounter for general adult medical examination with abnormal findings: Secondary | ICD-10-CM | POA: Insufficient documentation

## 2016-10-04 DIAGNOSIS — Z791 Long term (current) use of non-steroidal anti-inflammatories (NSAID): Secondary | ICD-10-CM

## 2016-10-04 DIAGNOSIS — K921 Melena: Secondary | ICD-10-CM | POA: Diagnosis not present

## 2016-10-04 DIAGNOSIS — R195 Other fecal abnormalities: Secondary | ICD-10-CM | POA: Diagnosis not present

## 2016-10-04 DIAGNOSIS — D62 Acute posthemorrhagic anemia: Secondary | ICD-10-CM | POA: Diagnosis not present

## 2016-10-04 MED ORDER — PANTOPRAZOLE SODIUM 40 MG PO TBEC: 40.0000 mg | DELAYED_RELEASE_TABLET | Freq: Two times a day (BID) | ORAL | 3 refills | Status: DC

## 2016-10-04 NOTE — Patient Instructions (Signed)
You have been scheduled for an endoscopy. Please follow written instructions given to you at your visit today. If you use inhalers (even only as needed), please bring them with you on the day of your procedure. Your physician has requested that you go to www.startemmi.com and enter the access code given to you at your visit today. This web site gives a general overview about your procedure. However, you should still follow specific instructions given to you by our office regarding your preparation for the procedure.  We have sent the following medications to your pharmacy for you to pick up at your convenience: Pantoprazole 40 mg twice a day  Continue carafate and iron supplement. Continue being off Mobic,

## 2016-10-04 NOTE — Progress Notes (Signed)
10/04/2016 Jack Russell [id number] 1961/01/10   HISTORY OF PRESENT ILLNESS:  This is a 56 year old male who is previously known to Dr. Sharlett Iles. He had a colonoscopy in November 2010 at which time the study was normal and a repeat was recommended in 10 years from that time.  Patient is here today at the request of Vicie Mutters, PA-C, for evaluation regarding recent drop in hemoglobin and black/heme positive stools. Patient's hemoglobin 5 months ago was 15.3 g. When his hemoglobin was checked 5 days ago it was 8.5 g. Yesterday hemoglobin 10.0 g. MCV is normal.  He tells me that last week he was trying to do normal routine type of chores and became very short of breath and his heart began pounding. He also had been having black stools for a few days. Was seen at PCPs office on March 8 where he was found to be Hemoccult-positive on exam. Was found to have labs as stated above. He was placed on Dexilant 60 mg twice daily along with Carafate 4 times a day and iron supplements. He tells me that he was treated for suspected ulcer a few years ago with PPI therapy, but he had abdominal pain at that time.  Stools have returned to normal color over the past couple of days. He admits to using Mobic every other day for quite some time for hand and joint stiffness.  Past Medical History:  Diagnosis Date  . Arthritis   . GERD (gastroesophageal reflux disease)   . Hyperlipidemia   . Other testicular hypofunction    Past Surgical History:  Procedure Laterality Date  . CHONDROPLASTY Left 09/15/2016   Procedure: CHONDROPLASTY;  Surgeon: Ninetta Lights, MD;  Location: [facility];  Service: Orthopedics;  Laterality: Left;  . COLONOSCOPY    . KNEE ARTHROSCOPY W/ MENISCAL REPAIR  2009   right  . KNEE ARTHROSCOPY WITH MEDIAL MENISECTOMY Left 09/15/2016   Procedure: LEFT KNEE ARTHROSCOPY CHONDROPLASTY  WITH MEDIAL MENISECTOMY;  Surgeon: Ninetta Lights, MD;  Location: [facility];   Service: Orthopedics;  Laterality: Left;  . PROXIMAL INTERPHALANGEAL FUSION (PIP) Left 05/14/2013   Procedure: RECONSTRUCTION BOUTONNIERE (LEFT SMALL FINGER) PINNING PROXIMAL INTERPHALANGEAL FUSION (PIP);  Surgeon: Wynonia Sours, MD;  Location: [facility];  Service: Orthopedics;  Laterality: Left;    reports that he has never smoked. He has never used smokeless tobacco. He reports that he does not drink alcohol or use drugs. family history includes Cancer in his maternal grandmother; Heart disease in his father; Hypertension in his father; Stroke in his father; Transient ischemic attack in his mother. No Known Allergies    Outpatient Encounter Prescriptions as of 10/04/2016  Medication Sig  . Ascorbic Acid (VITAMIN C) 1000 MG tablet Take 1,000 mg by mouth daily.  Marland Kitchen aspirin 81 MG tablet Take 81 mg by mouth daily.  . cholecalciferol (VITAMIN D) 1000 UNITS tablet Take 1,000 Units by mouth daily.  Marland Kitchen dexlansoprazole (DEXILANT) 60 MG capsule Take 60 mg by mouth 2 (two) times daily.  . fish oil-omega-3 fatty acids 1000 MG capsule Take 2 g by mouth daily.  . hydrocortisone (ANUSOL-HC) 2.5 % rectal cream Apply rectally 2 times daily  . Meloxicam (MOBIC PO) Take by mouth.  . sucralfate (CARAFATE) 1 g tablet Take 1 tablet (1 g total) by mouth 4 (four) times daily -  with meals and at bedtime.  Marland Kitchen testosterone cypionate (DEPOTESTOSTERONE CYPIONATE) 200 MG/ML injection INJECT 2CC INTRAMUSCULARLY EVERY  2 WEEKS   No facility-administered encounter medications on file as of 10/04/2016.      REVIEW OF SYSTEMS  : All other systems reviewed and negative except where noted in the History of Present Illness.   PHYSICAL EXAM: BP 120/78   Pulse 80   Ht 6' 0.75" (1.848 m)   Wt 204 lb 6 oz (92.7 kg)   BMI 27.15 kg/m  General: Well developed white male in no acute distress Head: Normocephalic and atraumatic Eyes:  Sclerae anicteric, conjunctiva pink. Ears: Normal auditory acuity Lungs: Clear  throughout to auscultation Heart: Regular rate and rhythm Abdomen: Soft, non-distended.  Normal bowel sounds.  Non-tender. Musculoskeletal: Symmetrical with no gross deformities  Skin: No lesions on visible extremities Extremities: No edema  Neurological: Alert oriented x 4, grossly non-focal Psychological:  Alert and cooperative. Normal mood and affect  ASSESSMENT AND PLAN: -56 year old male with acute blood loss anemia, drop in hemoglobin of 6-7 g and black/heme positive stool:  Does use NSAIDs in the form of Mobic regularly, long-term. Apparently has a history of suspected ulcer in the past. Likely has the same on this occasion.  Luckily stools have returned to normal color and hemoglobin trending up. He will continue on twice a day PPI and Carafate 4 times a day as well as iron supplements that were prescribed by his PCP. He will continue off of NSAIDs. We will schedule for EGD with Dr. Ardis Hughs confirm the diagnosis.  The risks, benefits, and alternatives to EGD were discussed with the patient and he consents to proceed.    CC:  Vicie Mutters, PA-C

## 2016-10-05 ENCOUNTER — Encounter: Payer: Self-pay | Admitting: Gastroenterology

## 2016-10-05 NOTE — Progress Notes (Signed)
I agree with the above note, plan 

## 2016-10-11 ENCOUNTER — Ambulatory Visit (AMBULATORY_SURGERY_CENTER): Payer: 59 | Admitting: Gastroenterology

## 2016-10-11 ENCOUNTER — Encounter: Payer: Self-pay | Admitting: Gastroenterology

## 2016-10-11 VITALS — BP 128/76 | HR 78 | Temp 97.5°F | Resp 20 | Ht 72.0 in | Wt 204.0 lb

## 2016-10-11 DIAGNOSIS — K2990 Gastroduodenitis, unspecified, without bleeding: Secondary | ICD-10-CM | POA: Diagnosis not present

## 2016-10-11 DIAGNOSIS — K219 Gastro-esophageal reflux disease without esophagitis: Secondary | ICD-10-CM | POA: Diagnosis not present

## 2016-10-11 DIAGNOSIS — K2970 Gastritis, unspecified, without bleeding: Secondary | ICD-10-CM

## 2016-10-11 DIAGNOSIS — K921 Melena: Secondary | ICD-10-CM | POA: Diagnosis present

## 2016-10-11 DIAGNOSIS — K2951 Unspecified chronic gastritis with bleeding: Secondary | ICD-10-CM | POA: Diagnosis not present

## 2016-10-11 DIAGNOSIS — K2981 Duodenitis with bleeding: Secondary | ICD-10-CM | POA: Diagnosis not present

## 2016-10-11 DIAGNOSIS — K269 Duodenal ulcer, unspecified as acute or chronic, without hemorrhage or perforation: Secondary | ICD-10-CM | POA: Diagnosis not present

## 2016-10-11 MED ORDER — SODIUM CHLORIDE 0.9 % IV SOLN: 500.0000 mL | INTRAVENOUS | Status: DC

## 2016-10-11 NOTE — Progress Notes (Signed)
Report to PACU, RN, vss, BBS= Clear.  

## 2016-10-11 NOTE — Op Note (Signed)
[name] Patient Name: Jack Russell Procedure Date: 10/11/2016 9:17 AM MRN: [id number] Endoscopist: Milus Banister , MD Age: 56 Referring MD:  Date of Birth: 1960/11/17 Gender: Male Account #: [id number] Procedure:                Upper GI endoscopy Indications:              Melena, anemia Medicines:                Monitored Anesthesia Care Procedure:                Pre-Anesthesia Assessment:                           - Prior to the procedure, a History and Physical                            was performed, and patient medications and                            allergies were reviewed. The patient's tolerance of                            previous anesthesia was also reviewed. The risks                            and benefits of the procedure and the sedation                            options and risks were discussed with the patient.                            All questions were answered, and informed consent                            was obtained. Prior Anticoagulants: The patient has                            taken no previous anticoagulant or antiplatelet                            agents. ASA Grade Assessment: II - A patient with                            mild systemic disease. After reviewing the risks                            and benefits, the patient was deemed in                            satisfactory condition to undergo the procedure.                           After obtaining informed consent, the endoscope was  passed under direct vision. Throughout the                            procedure, the patient's blood pressure, pulse, and                            oxygen saturations were monitored continuously. The                            Model GIF-HQ190 [id number]) scope was introduced                            through the mouth, and advanced to the second part                            of duodenum. The upper GI endoscopy was                            accomplished without difficulty. The patient                            tolerated the procedure well. Scope In: Scope Out: Findings:                 The esophagus was normal.                           Mild inflammation characterized by erythema and                            granularity was found in the gastric antrum.                            Biopsies were taken with a cold forceps for                            histology.                           One non-bleeding linear duodenal ulcer with no                            stigmata of bleeding was found in the distal                            duodenal bulb. The lesion was 5 mm in largest                            dimension. Biopsies were taken from the surrounding                            mucosa with a cold forceps for histology. Complications:            No immediate complications. Estimated blood loss:  None. Estimated Blood Loss:     Estimated blood loss: none. Impression:               - Normal esophagus.                           - Mild gastritis. Biopsied.                           - One clean based duodenal bulb ulcer with no                            stigmata of bleeding. Biopsied. Recommendation:           - Patient has a contact number available for                            emergencies. The signs and symptoms of potential                            delayed complications were discussed with the                            patient. Return to normal activities tomorrow.                            Written discharge instructions were provided to the                            patient.                           - Resume previous diet.                           - Continue present medications. Continue protonix                            twice daily for now. Stop carafate. Continue iron                            once or twice daily. Continue to avoid NSAID type                             pain medicines.                           - Await pathology results. Milus Banister, MD 10/11/2016 9:35:40 AM This report has been signed electronically.

## 2016-10-11 NOTE — Progress Notes (Signed)
Called to room to assist during endoscopic procedure.  Patient ID and intended procedure confirmed with present staff. Received instructions for my participation in the procedure from the performing physician.  

## 2016-10-11 NOTE — Patient Instructions (Signed)
YOU HAD AN ENDOSCOPIC PROCEDURE TODAY AT [facility]:   Refer to the procedure report that was given to you for any specific questions about what was found during the examination.  If the procedure report does not answer your questions, please call your gastroenterologist to clarify.  If you requested that your care partner not be given the details of your procedure findings, then the procedure report has been included in a sealed envelope for you to review at your convenience later.  YOU SHOULD EXPECT: Some feelings of bloating in the abdomen. Passage of more gas than usual.  Walking can help get rid of the air that was put into your GI tract during the procedure and reduce the bloating. If you had a lower endoscopy (such as a colonoscopy or flexible sigmoidoscopy) you may notice spotting of blood in your stool or on the toilet paper. If you underwent a bowel prep for your procedure, you may not have a normal bowel movement for a few days.  Please Note:  You might notice some irritation and congestion in your nose or some drainage.  This is from the oxygen used during your procedure.  There is no need for concern and it should clear up in a day or so.  SYMPTOMS TO REPORT IMMEDIATELY:    Following upper endoscopy (EGD)  Vomiting of blood or coffee ground material  New chest pain or pain under the shoulder blades  Painful or persistently difficult swallowing  New shortness of breath  Fever of 100F or higher  Black, tarry-looking stools  For urgent or emergent issues, a gastroenterologist can be reached at any hour by calling [phone].  Please read all handouts given to you by your recovery nurse.  Continue protonix twice daily for now. Stop carafate. Continue iron once or twice daily. No NSAIDS.   DIET:  We do recommend a small meal at first, but then you may proceed to your regular diet.  Drink plenty of fluids but you should avoid alcoholic beverages for 24  hours.  ACTIVITY:  You should plan to take it easy for the rest of today and you should NOT DRIVE or use heavy machinery until tomorrow (because of the sedation medicines used during the test).    FOLLOW UP: Our staff will call the number listed on your records the next business day following your procedure to check on you and address any questions or concerns that you may have regarding the information given to you following your procedure. If we do not reach you, we will leave a message.  However, if you are feeling well and you are not experiencing any problems, there is no need to return our call.  We will assume that you have returned to your regular daily activities without incident.  If any biopsies were taken you will be contacted by phone or by letter within the next 1-3 weeks.  Please call us at [phone] if you have not heard about the biopsies in 3 weeks.    SIGNATURES/CONFIDENTIALITY: You and/or your care partner have signed paperwork which will be entered into your electronic medical record.  These signatures attest to the fact that that the information above on your After Visit Summary has been reviewed and is understood.  Full responsibility of the confidentiality of this discharge information lies with you and/or your care-partner.  Thank you for letting us take care of your healthcare needs today.

## 2016-10-12 ENCOUNTER — Telehealth: Payer: Self-pay

## 2016-10-12 NOTE — Telephone Encounter (Signed)
  Follow up Call-  Call back number 10/11/2016  Post procedure Call Back phone  # [phone]  Permission to leave phone message Yes  Some recent data might be hidden     Patient questions:  Do you have a fever, pain , or abdominal swelling? No. Pain Score  0 *  Have you tolerated food without any problems? Yes.    Have you been able to return to your normal activities? Yes.    Do you have any questions about your discharge instructions: Diet   No. Medications  No. Follow up visit  No.  Do you have questions or concerns about your Care? No.  Actions: * If pain score is 4 or above: No action needed, pain <4.

## 2016-10-17 ENCOUNTER — Encounter: Payer: Self-pay | Admitting: Gastroenterology

## 2016-11-02 NOTE — Patient Instructions (Addendum)
Tumeric with Bioperine 900 mg 2 x/day  [email or web address].BadProtection.es  +++++++++++++++++++++++++++++++++++++++ Food Choices for Gastroesophageal Reflux Disease, Adult When you have gastroesophageal reflux disease (GERD), the foods you eat and your eating habits are very important. Choosing the right foods can help ease the discomfort of GERD. Consider working with a diet and nutrition specialist (dietitian) to help you make healthy food choices. What general guidelines should I follow? Eating plan   Choose healthy foods low in fat, such as fruits, vegetables, whole grains, low-fat dairy products, and lean meat, fish, and poultry.  Eat frequent, small meals instead of three large meals each day. Eat your meals slowly, in a relaxed setting. Avoid bending over or lying down until 2-3 hours after eating.  Limit high-fat foods such as fatty meats or fried foods.  Limit your intake of oils, butter, and shortening to less than 8 teaspoons each day.  Avoid the following:  Foods that cause symptoms. These may be different for different people. Keep a food diary to keep track of foods that cause symptoms.  Alcohol.  Drinking large amounts of liquid with meals.  Eating meals during the 2-3 hours before bed.  Cook foods using methods other than frying. This may include baking, grilling, or broiling. Lifestyle    Maintain a healthy weight. Ask your health care provider what weight is healthy for you. If you need to lose weight, work with your health care provider to do so safely.  Exercise for at least 30 minutes on 5 or more days each week, or as told by your health care provider.  Avoid wearing clothes that fit tightly around your waist and chest.  Do not use any products that contain nicotine or tobacco, such as cigarettes and e-cigarettes. If you need help quitting, ask your health care provider.  Sleep with the head of your bed raised. Use a wedge under the mattress or blocks under the bed frame  to raise the head of the bed. What foods are not recommended? The items listed may not be a complete list. Talk with your dietitian about what dietary choices are best for you. Grains  Pastries or quick breads with added fat. [place] toast. Vegetables  Deep fried vegetables. [place] fries. Any vegetables prepared with added fat. Any vegetables that cause symptoms. For some people this may include tomatoes and tomato products, chili peppers, onions and garlic, and horseradish. Fruits  Any fruits prepared with added fat. Any fruits that cause symptoms. For some people this may include citrus fruits, such as oranges, grapefruit, pineapple, and lemons. Meats and other protein foods  High-fat meats, such as fatty beef or pork, hot dogs, ribs, ham, sausage, salami and bacon. Fried meat or protein, including fried fish and fried chicken. Nuts and nut butters. Dairy  Whole milk and chocolate milk. Sour cream. Cream. Ice cream. Cream cheese. Milk shakes. Beverages  Coffee and tea, with or without caffeine. Carbonated beverages. Sodas. Energy drinks. Fruit juice made with acidic fruits (such as orange or grapefruit). Tomato juice. Alcoholic drinks. Fats and oils  Butter. Margarine. Shortening. Ghee. Sweets and desserts  Chocolate and cocoa. Donuts. Seasoning and other foods  Pepper. Peppermint and spearmint. Any condiments, herbs, or seasonings that cause symptoms. For some people, this may include curry, hot sauce, or vinegar-based salad dressings. Summary  When you have gastroesophageal reflux disease (GERD), food and lifestyle choices are very important to help ease the discomfort of GERD.  Eat frequent, small meals instead of three large meals each day.  Eat your meals slowly, in a relaxed setting. Avoid bending over or lying down until 2-3 hours after eating.  Limit high-fat foods such as fatty meat or fried foods. This information is not intended to replace advice given to you by your health  care provider. Make sure you discuss any questions you have with your health care provider. Document Released: 07/11/2005 Document Revised: 07/12/2016 Document Reviewed: 07/12/2016 Elsevier Interactive Patient Education  2017 [employer].  +++++++++++++++++++++++++++++++ Recommend Adult Low Dose Aspirin or  coated  Aspirin 81 mg daily  To reduce risk of Colon Cancer 20 %,  Skin Cancer 26 % ,  Melanoma 46%  and  Pancreatic cancer 60% +++++++++++++++++++++++++ Vitamin D goal  is between 70-100.  Please make sure that you are taking your Vitamin D as directed.  It is very important as a natural anti-inflammatory  helping hair, skin, and nails, as well as reducing stroke and heart attack risk.  It helps your bones and helps with mood. It also decreases numerous cancer risks so please take it as directed.  Low Vit D is associated with a 200-300% higher risk for CANCER  and 200-300% higher risk for HEART   ATTACK  &  STROKE.   .....................................Marland Kitchen It is also associated with higher death rate at younger ages,  autoimmune diseases like Rheumatoid arthritis, Lupus, Multiple Sclerosis.    Also many other serious conditions, like depression, Alzheimer's Dementia, infertility, muscle aches, fatigue, fibromyalgia - just to name a few. ++++++++++++++++++++ Recommend the book "The END of DIETING" by Dr Excell Seltzer  & the book "The END of DIABETES " by Dr Excell Seltzer At [email or web address] - get book & Audio CD's    Being diabetic has a  300% increased risk for heart attack, stroke, cancer, and alzheimer- type vascular dementia. It is very important that you work harder with diet by avoiding all foods that are white. Avoid white rice (brown & wild rice is OK), white potatoes (sweetpotatoes in moderation is OK), White bread or wheat bread or anything made out of white flour like bagels, donuts, rolls, buns, biscuits, cakes, pastries, cookies, pizza crust, and pasta (made from white flour &  egg whites) - vegetarian pasta or spinach or wheat pasta is OK. Multigrain breads like Arnold's or Pepperidge Farm, or multigrain sandwich thins or flatbreads.  Diet, exercise and weight loss can reverse and cure diabetes in the early stages.  Diet, exercise and weight loss is very important in the control and prevention of complications of diabetes which affects every system in your body, ie. Brain - dementia/stroke, eyes - glaucoma/blindness, heart - heart attack/heart failure, kidneys - dialysis, stomach - gastric paralysis, intestines - malabsorption, nerves - severe painful neuritis, circulation - gangrene & loss of a leg(s), and finally cancer and Alzheimers.    I recommend avoid fried & greasy foods,  sweets/candy, white rice (brown or wild rice or Quinoa is OK), white potatoes (sweet potatoes are OK) - anything made from white flour - bagels, doughnuts, rolls, buns, biscuits,white and wheat breads, pizza crust and traditional pasta made of white flour & egg white(vegetarian pasta or spinach or wheat pasta is OK).  Multi-grain bread is OK - like multi-grain flat bread or sandwich thins. Avoid alcohol in excess. Exercise is also important.    Eat all the vegetables you want - avoid meat, especially red meat and dairy - especially cheese.  Cheese is the most concentrated form of trans-fats which is the worst thing to clog up our  arteries. Veggie cheese is OK which can be found in the fresh produce section at [employer] or [employer] or [employer]  +++++++++++++++++++++ DASH Eating Plan  DASH stands for "Dietary Approaches to Stop Hypertension."   The DASH eating plan is a healthy eating plan that has been shown to reduce high blood pressure (hypertension). Additional health benefits may include reducing the risk of type 2 diabetes mellitus, heart disease, and stroke. The DASH eating plan may also help with weight loss. WHAT DO I NEED TO KNOW ABOUT THE DASH EATING PLAN? For the DASH eating plan,  you will follow these general guidelines:  Choose foods with a percent daily value for sodium of less than 5% (as listed on the food label).  Use salt-free seasonings or herbs instead of table salt or sea salt.  Check with your health care provider or pharmacist before using salt substitutes.  Eat lower-sodium products, often labeled as "lower sodium" or "no salt added."  Eat fresh foods.  Eat more vegetables, fruits, and low-fat dairy products.  Choose whole grains. Look for the word "whole" as the first word in the ingredient list.  Choose fish   Limit sweets, desserts, sugars, and sugary drinks.  Choose heart-healthy fats.  Eat veggie cheese   Eat more home-cooked food and less restaurant, buffet, and fast food.  Limit fried foods.  Cook foods using methods other than frying.  Limit canned vegetables. If you do use them, rinse them well to decrease the sodium.  When eating at a restaurant, ask that your food be prepared with less salt, or no salt if possible.                      WHAT FOODS CAN I EAT? Read Dr Fara Olden Fuhrman's books on The End of Dieting & The End of Diabetes  Grains Whole grain or whole wheat bread. Brown rice. Whole grain or whole wheat pasta. Quinoa, bulgur, and whole grain cereals. Low-sodium cereals. Corn or whole wheat flour tortillas. Whole grain cornbread. Whole grain crackers. Low-sodium crackers.  Vegetables Fresh or frozen vegetables (raw, steamed, roasted, or grilled). Low-sodium or reduced-sodium tomato and vegetable juices. Low-sodium or reduced-sodium tomato sauce and paste. Low-sodium or reduced-sodium canned vegetables.   Fruits All fresh, canned (in natural juice), or frozen fruits.  Protein Products  All fish and seafood.  Dried beans, peas, or lentils. Unsalted nuts and seeds. Unsalted canned beans.  Dairy Low-fat dairy products, such as skim or 1% milk, 2% or reduced-fat cheeses, low-fat ricotta or cottage cheese, or plain  low-fat yogurt. Low-sodium or reduced-sodium cheeses.  Fats and Oils Tub margarines without trans fats. Light or reduced-fat mayonnaise and salad dressings (reduced sodium). Avocado. Safflower, olive, or canola oils. Natural peanut or almond butter.  Other Unsalted popcorn and pretzels. The items listed above may not be a complete list of recommended foods or beverages. Contact your dietitian for more options.  +++++++++++++++  WHAT FOODS ARE NOT RECOMMENDED? Grains/ White flour or wheat flour White bread. White pasta. White rice. Refined cornbread. Bagels and croissants. Crackers that contain trans fat.  Vegetables  Creamed or fried vegetables. Vegetables in a . Regular canned vegetables. Regular canned tomato sauce and paste. Regular tomato and vegetable juices.  Fruits Dried fruits. Canned fruit in light or heavy syrup. Fruit juice.  Meat and Other Protein Products Meat in general - RED meat & White meat.  Fatty cuts of meat. Ribs, chicken wings, all processed meats as bacon, sausage,  bologna, salami, fatback, hot dogs, bratwurst and packaged luncheon meats.  Dairy Whole or 2% milk, cream, half-and-half, and cream cheese. Whole-fat or sweetened yogurt. Full-fat cheeses or blue cheese. Non-dairy creamers and whipped toppings. Processed cheese, cheese spreads, or cheese curds.  Condiments Onion and garlic salt, seasoned salt, table salt, and sea salt. Canned and packaged gravies. Worcestershire sauce. Tartar sauce. Barbecue sauce. Teriyaki sauce. Soy sauce, including reduced sodium. Steak sauce. Fish sauce. Oyster sauce. Cocktail sauce. Horseradish. Ketchup and mustard. Meat flavorings and tenderizers. Bouillon cubes. Hot sauce. Tabasco sauce. Marinades. Taco seasonings. Relishes.  Fats and Oils Butter, stick margarine, lard, shortening and bacon fat. Coconut, palm kernel, or palm oils. Regular salad dressings.  Pickles and olives. Salted popcorn and pretzels.  The items listed  above may not be a complete list of foods and beverages to avoid.

## 2016-11-02 NOTE — Progress Notes (Signed)
This very nice 56 y.o. MWM presents for 6 month follow up with Hypertension, Hyperlipidemia, Pre-Diabetes and Vitamin D Deficiency.  Last month , patient has a significant drop in Hgb to 8.1 gm% with EGD finding gastritis and a small DU attributed to NSAIDs and with Tx with bid Protonix and Carafate - sx's improved rapidly.      Patient is treated for HTN (2008) & BP has been controlled at home. Today's BP is at goal - 124/82. Patient did have an episode of pAfib in 2012. Patient has had no complaints of any cardiac type chest pain, palpitations, dyspnea/orthopnea/PND, dizziness, claudication, or dependent edema.     Hyperlipidemia is not controlled with diet & fish oil supplements.  Last Lipids were not at goal:  Lab Results  Component Value Date   CHOL 206 (H) 05/03/2016   HDL 39 (L) 05/03/2016   LDLCALC 131 (H) 05/03/2016   TRIG 182 (H) 05/03/2016   CHOLHDL 5.3 (H) 05/03/2016      Also, the patient has history of PreDiabetes (A1c 5.9% in 2016)  and has had no symptoms of reactive hypoglycemia, diabetic polys, paresthesias or visual blurring.  Last A1c was at goal: Lab Results  Component Value Date   HGBA1C 5.3 05/03/2016      Patient has Low Testosterone ( level 224 in 2007) and is on replacement injections. Further, the patient also has history of Vitamin D Deficiency and supplements vitamin D without any suspected side-effects. Last vitamin D was at goal:  Lab Results  Component Value Date   VD25OH 69 05/03/2016   No Known Allergies   PMHx:   Past Medical History:  Diagnosis Date  . Anemia   . Arthritis   . GERD (gastroesophageal reflux disease)   . Hyperlipidemia   . Other testicular hypofunction    Immunization History  Administered Date(s) Administered  . Influenza Split 04/16/2013, 04/22/2014, 04/27/2015  . Influenza-Unspecified 04/18/2016  . PPD Test 04/22/2014, 04/27/2015, 05/03/2016  . Pneumococcal Polysaccharide-23 04/12/2012  . Tdap 06/28/2011   Past  Surgical History:  Procedure Laterality Date  . CHONDROPLASTY Left 09/15/2016   Procedure: CHONDROPLASTY;  Surgeon: Ninetta Lights, MD;  Location: [facility];  Service: Orthopedics;  Laterality: Left;  . COLONOSCOPY    . KNEE ARTHROSCOPY W/ MENISCAL REPAIR  2009   right  . KNEE ARTHROSCOPY WITH MEDIAL MENISECTOMY Left 09/15/2016   Procedure: LEFT KNEE ARTHROSCOPY CHONDROPLASTY  WITH MEDIAL MENISECTOMY;  Surgeon: Ninetta Lights, MD;  Location: [facility];  Service: Orthopedics;  Laterality: Left;  . PROXIMAL INTERPHALANGEAL FUSION (PIP) Left 05/14/2013   Procedure: RECONSTRUCTION BOUTONNIERE (LEFT SMALL FINGER) PINNING PROXIMAL INTERPHALANGEAL FUSION (PIP);  Surgeon: Wynonia Sours, MD;  Location: [facility];  Service: Orthopedics;  Laterality: Left;   FHx:    Reviewed / unchanged  SHx:    Reviewed / unchanged  Systems Review:  Constitutional: Denies fever, chills, wt changes, headaches, insomnia, fatigue, night sweats, change in appetite. Eyes: Denies redness, blurred vision, diplopia, discharge, itchy, watery eyes.  ENT: Denies discharge, congestion, post nasal drip, epistaxis, sore throat, earache, hearing loss, dental pain, tinnitus, vertigo, sinus pain, snoring.  CV: Denies chest pain, palpitations, irregular heartbeat, syncope, dyspnea, diaphoresis, orthopnea, PND, claudication or edema. Respiratory: denies cough, dyspnea, DOE, pleurisy, hoarseness, laryngitis, wheezing.  Gastrointestinal: Denies dysphagia, odynophagia, heartburn, reflux, water brash, abdominal pain or cramps, nausea, vomiting, bloating, diarrhea, constipation, hematemesis, melena, hematochezia  or hemorrhoids. Genitourinary: Denies dysuria, frequency,  urgency, nocturia, hesitancy, discharge, hematuria or flank pain. Musculoskeletal: Denies arthralgias, myalgias, stiffness, jt. swelling, pain, limping or strain/sprain.  Skin: Denies pruritus, rash, hives, warts, acne, eczema or  change in skin lesion(s). Neuro: No weakness, tremor, incoordination, spasms, paresthesia or pain. Psychiatric: Denies confusion, memory loss or sensory loss. Endo: Denies change in weight, skin or hair change.  Heme/Lymph: No excessive bleeding, bruising or enlarged lymph nodes.  Physical Exam  BP 124/82   Pulse 64   Temp 97.3 F (36.3 C)   Resp 16   Ht 6' 0.75" (1.848 m)   Wt 200 lb 6.4 oz (90.9 kg)   BMI 26.62 kg/m   Appears well nourished, well groomed  and in no distress.  Eyes: PERRLA, EOMs, conjunctiva no swelling or erythema. Sinuses: No frontal/maxillary tenderness ENT/Mouth: EAC's clear, TM's nl w/o erythema, bulging. Nares clear w/o erythema, swelling, exudates. Oropharynx clear without erythema or exudates. Oral hygiene is good. Tongue normal, non obstructing. Hearing intact.  Neck: Supple. Thyroid nl. Car 2+/2+ without bruits, nodes or JVD. Chest: Respirations nl with BS clear & equal w/o rales, rhonchi, wheezing or stridor.  Cor: Heart sounds normal w/ regular rate and rhythm without sig. murmurs, gallops, clicks or rubs. Peripheral pulses normal and equal  without edema.  Abdomen: Soft & bowel sounds normal. Non-tender w/o guarding, rebound, hernias, masses or organomegaly.  Lymphatics: Unremarkable.  Musculoskeletal: Full ROM all peripheral extremities, joint stability, 5/5 strength and normal gait.  Skin: Warm, dry without exposed rashes, lesions or ecchymosis apparent.  Neuro: Cranial nerves intact, reflexes equal bilaterally. Sensory-motor testing grossly intact. Tendon reflexes grossly intact.  Pysch: Alert & oriented x 3.  Insight and judgement nl & appropriate. No ideations.  Assessment and Plan:  1. Essential hypertension  - Continue medication, monitor blood pressure at home.  - Continue DASH diet. Reminder to go to the ER if any CP,  SOB, nausea, dizziness, severe HA, changes vision/speech,  left arm numbness and tingling and jaw pain. - CBC with  Differential/Platelet - BASIC METABOLIC PANEL WITH GFR - Magnesium - TSH  2. Mixed hyperlipidemia  - Continue diet/meds, exercise,& lifestyle modifications.  - Continue monitor periodic cholesterol/liver & renal functions  - Hepatic function panel - Lipid panel - TSH  3. Prediabetes  - Continue diet, exercise, lifestyle modifications.  - Monitor appropriate labs.  - Hemoglobin A1c - Insulin, random  4. Vitamin D deficiency  - Continue supplementation.  - VITAMIN D 25 Hydroxy   5. Testosterone deficiency  - Testosterone  6. Gastroesophageal reflux disease   7. Chronic superficial gastritis with bleeding  - CBC with Differential/Platelet - Iron and TIBC  8. Iron deficiency anemia due to chronic blood loss  - CBC with Differential/Platelet - Iron and TIBC  9. Medication management  - CBC with Differential/Platelet - BASIC METABOLIC PANEL WITH GFR - Hepatic function panel - Magnesium - Lipid panel - TSH - Hemoglobin A1c - Insulin, random - VITAMIN D 25 Hydroxy  - Testosterone - Iron and TIBC       Discussed  regular exercise, BP monitoring, weight control to achieve/maintain BMI less than 25 and discussed med and SE's. Recommended labs to assess and monitor clinical status with further disposition pending results of labs. Over 30 minutes of exam, counseling, chart review was performed.

## 2016-11-03 ENCOUNTER — Encounter: Payer: Self-pay | Admitting: Internal Medicine

## 2016-11-03 ENCOUNTER — Ambulatory Visit (INDEPENDENT_AMBULATORY_CARE_PROVIDER_SITE_OTHER): Payer: 59 | Admitting: Internal Medicine

## 2016-11-03 VITALS — BP 124/82 | HR 64 | Temp 97.3°F | Resp 16 | Ht 72.75 in | Wt 200.4 lb

## 2016-11-03 DIAGNOSIS — R7303 Prediabetes: Secondary | ICD-10-CM | POA: Diagnosis not present

## 2016-11-03 DIAGNOSIS — K2931 Chronic superficial gastritis with bleeding: Secondary | ICD-10-CM | POA: Diagnosis not present

## 2016-11-03 DIAGNOSIS — E559 Vitamin D deficiency, unspecified: Secondary | ICD-10-CM | POA: Diagnosis not present

## 2016-11-03 DIAGNOSIS — K219 Gastro-esophageal reflux disease without esophagitis: Secondary | ICD-10-CM

## 2016-11-03 DIAGNOSIS — D500 Iron deficiency anemia secondary to blood loss (chronic): Secondary | ICD-10-CM

## 2016-11-03 DIAGNOSIS — Z79899 Other long term (current) drug therapy: Secondary | ICD-10-CM

## 2016-11-03 DIAGNOSIS — E782 Mixed hyperlipidemia: Secondary | ICD-10-CM

## 2016-11-03 DIAGNOSIS — I10 Essential (primary) hypertension: Secondary | ICD-10-CM

## 2016-11-03 DIAGNOSIS — E349 Endocrine disorder, unspecified: Secondary | ICD-10-CM | POA: Diagnosis not present

## 2016-11-03 LAB — CBC WITH DIFFERENTIAL/PLATELET
BASOS ABS: 90 {cells}/uL (ref 0–200)
Basophils Relative: 2 %
Eosinophils Absolute: 315 cells/uL (ref 15–500)
Eosinophils Relative: 7 %
HEMATOCRIT: 42.7 % (ref 38.5–50.0)
Hemoglobin: 13.8 g/dL (ref 13.2–17.1)
LYMPHS ABS: 1575 {cells}/uL (ref 850–3900)
Lymphocytes Relative: 35 %
MCH: 29.9 pg (ref 27.0–33.0)
MCHC: 32.3 g/dL (ref 32.0–36.0)
MCV: 92.4 fL (ref 80.0–100.0)
MONO ABS: 270 {cells}/uL (ref 200–950)
MPV: 10.4 fL (ref 7.5–12.5)
Monocytes Relative: 6 %
NEUTROS PCT: 50 %
Neutro Abs: 2250 cells/uL (ref 1500–7800)
Platelets: 312 10*3/uL (ref 140–400)
RBC: 4.62 MIL/uL (ref 4.20–5.80)
RDW: 13 % (ref 11.0–15.0)
WBC: 4.5 10*3/uL (ref 3.8–10.8)

## 2016-11-03 LAB — LIPID PANEL
CHOL/HDL RATIO: 4.7 ratio (ref <5.0)
Cholesterol: 200 mg/dL — ABNORMAL HIGH (ref <200)
HDL: 43 mg/dL (ref >40)
LDL CALC: 124 mg/dL — AB (ref <100)
Triglycerides: 167 mg/dL — ABNORMAL HIGH (ref <150)
VLDL: 33 mg/dL — ABNORMAL HIGH (ref <30)

## 2016-11-03 LAB — BASIC METABOLIC PANEL WITH GFR
BUN: 14 mg/dL (ref 7–25)
CHLORIDE: 104 mmol/L (ref 98–110)
CO2: 28 mmol/L (ref 20–31)
Calcium: 9.2 mg/dL (ref 8.6–10.3)
Creat: 1.16 mg/dL (ref 0.70–1.33)
GFR, Est African American: 80 mL/min (ref >=60)
GFR, Est Non African American: 70 mL/min (ref >=60)
Glucose, Bld: 104 mg/dL — ABNORMAL HIGH (ref 65–99)
Potassium: 4.2 mmol/L (ref 3.5–5.3)
Sodium: 139 mmol/L (ref 135–146)

## 2016-11-03 LAB — HEPATIC FUNCTION PANEL
ALK PHOS: 56 U/L (ref 40–115)
ALT: 16 U/L (ref 9–46)
AST: 17 U/L (ref 10–35)
Albumin: 4.1 g/dL (ref 3.6–5.1)
BILIRUBIN DIRECT: 0 mg/dL (ref <=0.2)
BILIRUBIN TOTAL: 0.2 mg/dL (ref 0.2–1.2)
Indirect Bilirubin: 0.2 mg/dL (ref 0.2–1.2)
Total Protein: 6.8 g/dL (ref 6.1–8.1)

## 2016-11-03 LAB — IRON AND TIBC
%SAT: 18 % (ref 15–60)
Iron: 59 ug/dL (ref 50–180)
TIBC: 328 ug/dL (ref 250–425)
UIBC: 269 ug/dL (ref 125–400)

## 2016-11-03 LAB — TSH: TSH: 2.92 m[IU]/L (ref 0.40–4.50)

## 2016-11-04 ENCOUNTER — Encounter: Payer: Self-pay | Admitting: Internal Medicine

## 2016-11-04 LAB — MAGNESIUM: Magnesium: 2.1 mg/dL (ref 1.5–2.5)

## 2016-11-04 LAB — HEMOGLOBIN A1C
Hgb A1c MFr Bld: 4.3 % (ref <5.7)
Mean Plasma Glucose: 77 mg/dL

## 2016-11-04 LAB — VITAMIN D 25 HYDROXY (VIT D DEFICIENCY, FRACTURES): VIT D 25 HYDROXY: 69 ng/mL (ref 30–100)

## 2016-11-04 LAB — TESTOSTERONE: Testosterone: 313 ng/dL (ref 250–827)

## 2016-11-04 LAB — INSULIN, RANDOM: Insulin: 42.4 u[IU]/mL — ABNORMAL HIGH (ref 2.0–19.6)

## 2016-12-30 NOTE — Addendum Note (Signed)
Addendum  created 12/30/16 1033 by Lyn Hollingshead, MD   Sign clinical note

## 2017-01-11 ENCOUNTER — Other Ambulatory Visit: Payer: Self-pay | Admitting: Emergency Medicine

## 2017-01-11 MED ORDER — PANTOPRAZOLE SODIUM 40 MG PO TBEC: 40.0000 mg | DELAYED_RELEASE_TABLET | Freq: Two times a day (BID) | ORAL | 3 refills | Status: DC

## 2017-01-11 NOTE — Telephone Encounter (Signed)
Refill sent for pantoprazole.  

## 2017-02-10 NOTE — Progress Notes (Signed)
Assessment and Plan:   Essential hypertension - continue medications, DASH diet, exercise and monitor at home. Call if greater than 130/80.  -     CBC with Differential/Platelet -     BASIC METABOLIC PANEL WITH GFR -     Hepatic function panel -     TSH  Paroxysmal A-fib ([place]) Will refer to cardio, suggest getting back on bASA for now -     Ambulatory referral to Cardiology  Mixed hyperlipidemia -continue medications, check lipids, decrease fatty foods, increase activity.  -     Lipid panel  Medication management -     Magnesium  Testosterone deficiency Continue medication  De Quervain's tenosynovitis, right Thumb spica splint, RICE, follow up ortho is not better.   Chronic right-sided low back pain without sciatica -     Ambulatory referral to Physical Therapy  Continue diet and meds as discussed. Further disposition pending results of labs. Over 30 minutes of exam, counseling, chart review, and critical decision making was performed  Future Appointments Date Time Provider [place]  05/31/2017 2:00 PM [name], MD [facility] None     HPI 56 y.o. male  presents for 3 month follow up on hypertension, cholesterol, prediabetes, and vitamin D deficiency.  His blood pressure has been controlled at home, today their BP is BP: 122/80  He does workout. He denies chest pain, shortness of breath, dizziness. Has history of Afib in 2012, CHADSVASC 1, however he is off bASA due to UGB due to NSAIDS, on protonix but only taking every other day. He states he has been feeling his afib more often, cardizem is not helping 240mg  once or twice a day, going in and out of afib, would like referral to cardiology.  He states his right hand has some pain, tumeric is helping but worse at thenar prominence in the AM and after mowing, almost feels like it locks.   He is on cholesterol medication and denies myalgias. His cholesterol is not at goal. The cholesterol last visit was:    Lab Results  Component Value Date   CHOL 200 (H) 11/03/2016   HDL 43 11/03/2016   LDLCALC 124 (H) 11/03/2016   TRIG 167 (H) 11/03/2016   CHOLHDL 4.7 11/03/2016   Last A1C in the office was:  Lab Results  Component Value Date   HGBA1C 4.3 11/03/2016   Patient is on Vitamin D supplement.   Lab Results  Component Value Date   VD25OH 69 11/03/2016     He has a history of testosterone deficiency and is on testosterone replacement. He states that the testosterone helps with his energy, libido, muscle mass. Lab Results  Component Value Date   TESTOSTERONE 313 11/03/2016   BMI is Body mass index is 26.2 kg/m., he is working on diet and exercise. Wt Readings from Last 3 Encounters:  02/13/17 197 lb 3.2 oz (89.4 kg)  11/03/16 200 lb 6.4 oz (90.9 kg)  10/11/16 204 lb (92.5 kg)    Current Medications:  Current Outpatient Prescriptions on File Prior to Visit  Medication Sig  . Ascorbic Acid (VITAMIN C) 1000 MG tablet Take 1,000 mg by mouth daily.  Marland Kitchen aspirin 81 MG tablet Take 81 mg by mouth daily.  . cholecalciferol (VITAMIN D) 1000 UNITS tablet Take 1,000 Units by mouth daily.  . fish oil-omega-3 fatty acids 1000 MG capsule Take 2 g by mouth daily.  . hydrocortisone (ANUSOL-HC) 2.5 % rectal cream Apply rectally 2 times daily  .  pantoprazole (PROTONIX) 40 MG tablet Take 1 tablet (40 mg total) by mouth 2 (two) times daily before a meal.  . testosterone cypionate (DEPOTESTOSTERONE CYPIONATE) 200 MG/ML injection INJECT 2CC INTRAMUSCULARLY EVERY 2 WEEKS   No current facility-administered medications on file prior to visit.     Medical History:  Past Medical History:  Diagnosis Date  . Anemia   . Arthritis   . GERD (gastroesophageal reflux disease)   . Hyperlipidemia   . Other testicular hypofunction    Allergies: No Known Allergies   Review of Systems:  Review of Systems  Constitutional: Negative.   HENT: Negative.   Eyes: Negative.   Respiratory: Negative.     Cardiovascular: Positive for palpitations. Negative for chest pain, orthopnea, claudication, leg swelling and PND.  Gastrointestinal: Negative.   Genitourinary: Negative.   Musculoskeletal: Positive for myalgias. Negative for back pain, falls, joint pain and neck pain.  Skin: Negative.   Neurological: Negative.   Endo/Heme/Allergies: Negative.   Psychiatric/Behavioral: Negative.     Family history- Review and unchanged Social history- Review and unchanged Physical Exam: BP 122/80   Pulse 64   Temp (!) 97.4 F (36.3 C)   Resp 16   Ht 6' 0.75" (1.848 m)   Wt 197 lb 3.2 oz (89.4 kg)   SpO2 98%   BMI 26.20 kg/m  Wt Readings from Last 3 Encounters:  02/13/17 197 lb 3.2 oz (89.4 kg)  11/03/16 200 lb 6.4 oz (90.9 kg)  10/11/16 204 lb (92.5 kg)   General Appearance: Well nourished, in no apparent distress. Eyes: PERRLA, EOMs, conjunctiva no swelling or erythema Sinuses: No Frontal/maxillary tenderness ENT/Mouth: Ext aud canals clear, TMs without erythema, bulging. No erythema, swelling, or exudate on post pharynx.  Tonsils not swollen or erythematous. Hearing normal.  Neck: Supple, thyroid normal.  Respiratory: Respiratory effort normal, BS equal bilaterally without rales, rhonchi, wheezing or stridor.  Cardio: RRR with no MRGs. Brisk peripheral pulses without edema.  Abdomen: Soft, + BS,  Non tender, no guarding, rebound, hernias, masses. Lymphatics: Non tender without lymphadenopathy.  Musculoskeletal: Full ROM, 5/5 strength, Normal gait, negative straight leg raise, + lower right back pain, + finkelstein on right thumb, good distal neurovascular.  Skin: Warm, dry without rashes, lesions, ecchymosis.  Neuro: Cranial nerves intact. Normal muscle tone, no cerebellar symptoms. Psych: Awake and oriented X 3, normal affect, Insight and Judgment appropriate.    Vicie Mutters, PA-C 3:51 PM [facility]

## 2017-02-13 ENCOUNTER — Ambulatory Visit (INDEPENDENT_AMBULATORY_CARE_PROVIDER_SITE_OTHER): Payer: 59 | Admitting: Physician Assistant

## 2017-02-13 ENCOUNTER — Encounter: Payer: Self-pay | Admitting: Physician Assistant

## 2017-02-13 VITALS — BP 122/80 | HR 64 | Temp 97.4°F | Resp 16 | Ht 72.75 in | Wt 197.2 lb

## 2017-02-13 DIAGNOSIS — E349 Endocrine disorder, unspecified: Secondary | ICD-10-CM

## 2017-02-13 DIAGNOSIS — M545 Low back pain, unspecified: Secondary | ICD-10-CM

## 2017-02-13 DIAGNOSIS — M654 Radial styloid tenosynovitis [de Quervain]: Secondary | ICD-10-CM | POA: Diagnosis not present

## 2017-02-13 DIAGNOSIS — E782 Mixed hyperlipidemia: Secondary | ICD-10-CM | POA: Diagnosis not present

## 2017-02-13 DIAGNOSIS — Z79899 Other long term (current) drug therapy: Secondary | ICD-10-CM

## 2017-02-13 DIAGNOSIS — I480 Paroxysmal atrial fibrillation: Secondary | ICD-10-CM

## 2017-02-13 DIAGNOSIS — G8929 Other chronic pain: Secondary | ICD-10-CM | POA: Diagnosis not present

## 2017-02-13 DIAGNOSIS — I10 Essential (primary) hypertension: Secondary | ICD-10-CM | POA: Diagnosis not present

## 2017-02-13 LAB — CBC WITH DIFFERENTIAL/PLATELET
BASOS ABS: 52 {cells}/uL (ref 0–200)
Basophils Relative: 1 %
EOS PCT: 8 %
Eosinophils Absolute: 416 cells/uL (ref 15–500)
HEMATOCRIT: 44.7 % (ref 38.5–50.0)
HEMOGLOBIN: 14.8 g/dL (ref 13.2–17.1)
LYMPHS ABS: 1924 {cells}/uL (ref 850–3900)
Lymphocytes Relative: 37 %
MCH: 28.8 pg (ref 27.0–33.0)
MCHC: 33.1 g/dL (ref 32.0–36.0)
MCV: 87.1 fL (ref 80.0–100.0)
MPV: 10.2 fL (ref 7.5–12.5)
Monocytes Absolute: 572 cells/uL (ref 200–950)
Monocytes Relative: 11 %
Neutro Abs: 2236 cells/uL (ref 1500–7800)
Neutrophils Relative %: 43 %
Platelets: 249 10*3/uL (ref 140–400)
RBC: 5.13 MIL/uL (ref 4.20–5.80)
RDW: 15.1 % — ABNORMAL HIGH (ref 11.0–15.0)
WBC: 5.2 10*3/uL (ref 3.8–10.8)

## 2017-02-13 LAB — TSH: TSH: 2.99 m[IU]/L (ref 0.40–4.50)

## 2017-02-13 NOTE — Patient Instructions (Addendum)
Get thumb spica splint   De Quervain Tenosynovitis Tendons attach muscles to bones. They also help with joint movements. When tendons become irritated or swollen, it is called tendinitis. The extensor pollicis brevis (EPB) tendon connects the EPB muscle to a bone that is near the base of the thumb. The EPB muscle helps to straighten and extend the thumb. De Quervain tenosynovitis is a condition in which the EPB tendon lining (sheath) becomes irritated, thickened, and swollen. This condition is sometimes called stenosing tenosynovitis. This condition causes pain on the thumb side of the back of the wrist. What are the causes? Causes of this condition include:  Activities that repeatedly cause your thumb and wrist to extend.  A sudden increase in activity or change in activity that affects your wrist.  What increases the risk? This condition is more likely to develop in:  People who have diabetes.  Women who have recently given birth.  People who are over 79 years of age.  People who do activities that involve repeated hand and wrist motions, such as tennis, racquetball, volleyball, gardening, and taking care of children.  People who do heavy labor.  People who have poor wrist strength and flexibility.  People who do not warm up properly before activities.  What are the signs or symptoms? Symptoms of this condition include:  Pain or tenderness over the thumb side of the back of the wrist when your thumb and wrist are not moving.  Pain that gets worse when you straighten your thumb or extend your thumb or wrist.  Pain when the injured area is touched.  Locking or catching of the thumb joint while you bend and straighten your thumb.  Decreased thumb motion due to pain.  Swelling over the affected area.  How is this diagnosed? This condition is diagnosed with a medical history and physical exam. Your health care provider will ask for details about your injury and ask about  your symptoms. How is this treated? Treatment may include the use of icing and medicines to reduce pain and swelling. You may also be advised to wear a splint or brace to limit your thumb and wrist motion. In less severe cases, treatment may also include working with a physical therapist to strengthen your wrist and calm the irritation around your EPB tendon sheath. In severe cases, surgery may be needed. Follow these instructions at home: If you have a splint or brace:  Wear it as told by your health care provider. Remove it only as told by your health care provider.  Loosen the splint or brace if your fingers become numb and tingle, or if they turn cold and blue.  Keep the splint or brace clean and dry. Managing pain, stiffness, and swelling  If directed, apply ice to the injured area. ? Put ice in a plastic bag. ? Place a towel between your skin and the bag. ? Leave the ice on for 20 minutes, 2-3 times per day.  Move your fingers often to avoid stiffness and to lessen swelling.  Raise (elevate) the injured area above the level of your heart while you are sitting or lying down. General instructions  Return to your normal activities as told by your health care provider. Ask your health care provider what activities are safe for you.  Take over-the-counter and prescription medicines only as told by your health care provider.  Keep all follow-up visits as told by your health care provider. This is important.  Do not drive or operate  heavy machinery while taking prescription pain medicine. Contact a health care provider if:  Your pain, tenderness, or swelling gets worse, even if you have had treatment.  You have numbness or tingling in your wrist, hand, or fingers on the injured side. This information is not intended to replace advice given to you by your health care provider. Make sure you discuss any questions you have with your health care provider. Document Released: 07/11/2005  Document Revised: 12/17/2015 Document Reviewed: 09/16/2014 Elsevier Interactive Patient Education  [employer].

## 2017-02-14 LAB — LIPID PANEL
CHOLESTEROL: 209 mg/dL — AB (ref <200)
HDL: 40 mg/dL — ABNORMAL LOW (ref >40)
LDL Cholesterol: 129 mg/dL — ABNORMAL HIGH (ref <100)
Total CHOL/HDL Ratio: 5.2 Ratio — ABNORMAL HIGH (ref <5.0)
Triglycerides: 201 mg/dL — ABNORMAL HIGH (ref <150)
VLDL: 40 mg/dL — ABNORMAL HIGH (ref <30)

## 2017-02-14 LAB — BASIC METABOLIC PANEL WITH GFR
BUN: 13 mg/dL (ref 7–25)
CALCIUM: 9.1 mg/dL (ref 8.6–10.3)
CO2: 26 mmol/L (ref 20–31)
CREATININE: 1.25 mg/dL (ref 0.70–1.33)
Chloride: 102 mmol/L (ref 98–110)
GFR, Est African American: 73 mL/min (ref >=60)
GFR, Est Non African American: 64 mL/min (ref >=60)
Glucose, Bld: 83 mg/dL (ref 65–99)
Potassium: 4.3 mmol/L (ref 3.5–5.3)
SODIUM: 138 mmol/L (ref 135–146)

## 2017-02-14 LAB — MAGNESIUM: Magnesium: 2.2 mg/dL (ref 1.5–2.5)

## 2017-02-14 LAB — HEPATIC FUNCTION PANEL
ALT: 14 U/L (ref 9–46)
AST: 17 U/L (ref 10–35)
Albumin: 4.3 g/dL (ref 3.6–5.1)
Alkaline Phosphatase: 54 U/L (ref 40–115)
Bilirubin, Direct: 0.1 mg/dL (ref <=0.2)
Indirect Bilirubin: 0.6 mg/dL (ref 0.2–1.2)
TOTAL PROTEIN: 6.8 g/dL (ref 6.1–8.1)
Total Bilirubin: 0.7 mg/dL (ref 0.2–1.2)

## 2017-02-24 DIAGNOSIS — M545 Low back pain: Secondary | ICD-10-CM | POA: Diagnosis not present

## 2017-02-27 DIAGNOSIS — M545 Low back pain: Secondary | ICD-10-CM | POA: Diagnosis not present

## 2017-03-01 DIAGNOSIS — M545 Low back pain: Secondary | ICD-10-CM | POA: Diagnosis not present

## 2017-03-08 DIAGNOSIS — M545 Low back pain: Secondary | ICD-10-CM | POA: Diagnosis not present

## 2017-03-10 DIAGNOSIS — M545 Low back pain: Secondary | ICD-10-CM | POA: Diagnosis not present

## 2017-03-13 ENCOUNTER — Encounter: Payer: Self-pay | Admitting: Internal Medicine

## 2017-03-13 ENCOUNTER — Ambulatory Visit (INDEPENDENT_AMBULATORY_CARE_PROVIDER_SITE_OTHER): Payer: 59 | Admitting: Internal Medicine

## 2017-03-13 VITALS — BP 120/82 | HR 66 | Ht 72.9 in | Wt 195.4 lb

## 2017-03-13 DIAGNOSIS — I480 Paroxysmal atrial fibrillation: Secondary | ICD-10-CM | POA: Diagnosis not present

## 2017-03-13 DIAGNOSIS — M545 Low back pain: Secondary | ICD-10-CM | POA: Diagnosis not present

## 2017-03-13 MED ORDER — FLECAINIDE ACETATE 100 MG PO TABS: ORAL_TABLET | ORAL | 1 refills | Status: DC

## 2017-03-13 MED ORDER — DILTIAZEM HCL 30 MG PO TABS: ORAL_TABLET | ORAL | 1 refills | Status: DC

## 2017-03-13 NOTE — Progress Notes (Signed)
Electrophysiology Office Note   Date:  03/13/2017   ID:  Jack Russell, DOB Jul 13, 1961, MRN [id number]  PCP:  [name], MD  Cardiologist:  none Primary Electrophysiologist: Thompson Grayer, MD    CC: afib   History of Present Illness: Jack Russell is a 56 y.o. male who presents today for electrophysiology evaluation.   He is referred by Dr [name] for further evaluation and management of afib.  He reports initially being diagnosed with afib several years ago (review of PCP notes suggest 2012).  He has had increasing frequency and duration of afib since that time.  He is unaware of triggers but reports until recently episodes mostly occurred at night.  He has had a sleep study in 2017 which did not reveal sleep apnea.  He now has begun having afib during the day.  Episodes are lasting longer (up to 8 days).  He had afib in March, May, and July of this year.  He has fatigue and feels washed out with his afib.  Today, he denies symptoms of palpitations, chest pain, shortness of breath, orthopnea, PND, lower extremity edema, claudication, dizziness, presyncope, syncope, bleeding, or neurologic sequela. The patient is tolerating medications without difficulties and is otherwise without complaint today.    Past Medical History:  Diagnosis Date  . Anemia   . Arthritis   . GERD (gastroesophageal reflux disease)   . Hyperlipidemia   . Other testicular hypofunction    Past Surgical History:  Procedure Laterality Date  . CHONDROPLASTY Left 09/15/2016   Procedure: CHONDROPLASTY;  Surgeon: Ninetta Lights, MD;  Location: [facility];  Service: Orthopedics;  Laterality: Left;  . COLONOSCOPY    . KNEE ARTHROSCOPY W/ MENISCAL REPAIR  2009   right  . KNEE ARTHROSCOPY WITH MEDIAL MENISECTOMY Left 09/15/2016   Procedure: LEFT KNEE ARTHROSCOPY CHONDROPLASTY  WITH MEDIAL MENISECTOMY;  Surgeon: Ninetta Lights, MD;  Location: [facility];  Service: Orthopedics;   Laterality: Left;  . PROXIMAL INTERPHALANGEAL FUSION (PIP) Left 05/14/2013   Procedure: RECONSTRUCTION BOUTONNIERE (LEFT SMALL FINGER) PINNING PROXIMAL INTERPHALANGEAL FUSION (PIP);  Surgeon: Wynonia Sours, MD;  Location: [facility];  Service: Orthopedics;  Laterality: Left;     Current Outpatient Prescriptions  Medication Sig Dispense Refill  . Ascorbic Acid (VITAMIN C) 1000 MG tablet Take 1,000 mg by mouth daily.    Marland Kitchen aspirin 81 MG tablet Take 81 mg by mouth daily.    . cholecalciferol (VITAMIN D) 1000 UNITS tablet Take 1,000 Units by mouth daily.    Marland Kitchen CINNAMON PO Take by mouth.    . fish oil-omega-3 fatty acids 1000 MG capsule Take 2 g by mouth daily.    . hydrocortisone (ANUSOL-HC) 2.5 % rectal cream Apply rectally 2 times daily 30 g 1  . hydrocortisone 2.5 % cream Apply 1 application topically daily as needed.    . pantoprazole (PROTONIX) 40 MG tablet Take 40 mg by mouth every other day.    . testosterone cypionate (DEPOTESTOSTERONE CYPIONATE) 200 MG/ML injection INJECT 2CC INTRAMUSCULARLY EVERY 2 WEEKS 10 mL 3  . TURMERIC PO Take by mouth.     No current facility-administered medications for this visit.     Allergies:   Patient has no known allergies.   Social History:  The patient  reports that he has never smoked. He has never used smokeless tobacco. He reports that he does not drink alcohol or use drugs.   Family History:  The patient's  family history includes Cancer in his maternal grandmother; Colon cancer (age of onset: 86) in his maternal grandmother; Heart disease in his father; Hypertension in his father; Stroke in his father; Transient ischemic attack in his mother.    ROS:  Please see the history of present illness.   All other systems are personally reviewed and negative.    PHYSICAL EXAM: VS:  BP 120/82   Pulse 66   Ht 6' 0.9" (1.852 m)   Wt 195 lb 6.4 oz (88.6 kg)   SpO2 98%   BMI 25.85 kg/m  , BMI Body mass index is 25.85 kg/m. GEN: Well  nourished, well developed, in no acute distress  HEENT: normal  Neck: no JVD, carotid bruits, or masses Cardiac: RRR; no murmurs, rubs, or gallops,no edema  Respiratory:  clear to auscultation bilaterally, normal work of breathing GI: soft, nontender, nondistended, + BS MS: no deformity or atrophy  Skin: warm and dry  Neuro:  Strength and sensation are intact Psych: euthymic mood, full affect  EKG:  EKG is ordered today. The ekg ordered today is personally reviewed and shows sinus rhythm 58 bpm, PR 158 msec, otherwise normal ekg   Recent Labs: 02/13/2017: ALT 14; BUN 13; Creat 1.25; Hemoglobin 14.8; Magnesium 2.2; Platelets 249; Potassium 4.3; Sodium 138; TSH 2.99  personally reviewed   Lipid Panel     Component Value Date/Time   CHOL 209 (H) 02/13/2017 1606   TRIG 201 (H) 02/13/2017 1606   HDL 40 (L) 02/13/2017 1606   CHOLHDL 5.2 (H) 02/13/2017 1606   VLDL 40 (H) 02/13/2017 1606   LDLCALC 129 (H) 02/13/2017 1606   personally reviewed   Wt Readings from Last 3 Encounters:  03/13/17 195 lb 6.4 oz (88.6 kg)  02/13/17 197 lb 3.2 oz (89.4 kg)  11/03/16 200 lb 6.4 oz (90.9 kg)      Other studies personally reviewed: Additional studies/ records that were reviewed today include: all ekgs in epic were reviewed and reveal sinus.  Dr Melford Aase did send me several office ekgs from 06/21/2011 and 10/11/12 which clearly revealed afib   ASSESSMENT AND PLAN:  1.  Paroxysmal atrial fibrillation Therapeutic strategies for afib including medicine and ablation were discussed in detail with the patient today. Risk, benefits, and alternatives to EP study and radiofrequency ablation for afib were also discussed in detail today.  At this time, I think that a pill in pocket approach may be best.  We discussed diltiazem 30mg  prn followed by pill in pocket flecainide 300mg  po x 1 for recurrence of afib. chads2vasc score is 0.  No anticoagulation is therefore advised at this time. Echo to evaluate  for structural changes related to afib I am not sure that testosterone is a potential cause for his afib.  He will discuss the possibility of weaning testosterone with PCP.   Follow-up:  Return to see me in 4 months  Current medicines are reviewed at length with the patient today.   The patient does not have concerns regarding his medicines.  The following changes were made today:  none    Signed, Thompson Grayer, MD  03/13/2017 10:53 AM     [facility] [address] [place] [id number] [phone] (office) [phone] (fax)

## 2017-03-13 NOTE — Patient Instructions (Addendum)
Medication Instructions:  Your physician has recommended you make the following change in your medication:  1) Take Diltiazem 30 mg --take 1-tablets by mouth every 6 hours as needed for fast heart rates 2) Take Flecainide 100 mg---take 3 tablets (300 MG) at on set of afib   Labwork: None ordered   Testing/Procedures: Your physician has requested that you have an echocardiogram. Echocardiography is a painless test that uses sound waves to create images of your heart. It provides your doctor with information about the size and shape of your heart and how well your heart's chambers and valves are working. This procedure takes approximately one hour. There are no restrictions for this procedure.    Follow-Up: Your physician recommends that you schedule a follow-up appointment in: 4 months with Dr Rayann Heman   Thank you for choosing [facility]!!     Janan Halter, RN [phone]

## 2017-03-15 DIAGNOSIS — M545 Low back pain: Secondary | ICD-10-CM | POA: Diagnosis not present

## 2017-03-17 ENCOUNTER — Ambulatory Visit (HOSPITAL_COMMUNITY): Payer: 59 | Attending: Cardiovascular Disease

## 2017-03-17 ENCOUNTER — Other Ambulatory Visit: Payer: Self-pay

## 2017-03-17 DIAGNOSIS — I480 Paroxysmal atrial fibrillation: Secondary | ICD-10-CM | POA: Diagnosis not present

## 2017-03-17 DIAGNOSIS — I4891 Unspecified atrial fibrillation: Secondary | ICD-10-CM | POA: Diagnosis not present

## 2017-03-29 ENCOUNTER — Other Ambulatory Visit: Payer: Self-pay | Admitting: Internal Medicine

## 2017-03-30 DIAGNOSIS — M545 Low back pain: Secondary | ICD-10-CM | POA: Diagnosis not present

## 2017-04-13 DIAGNOSIS — M545 Low back pain: Secondary | ICD-10-CM | POA: Diagnosis not present

## 2017-04-19 DIAGNOSIS — M545 Low back pain: Secondary | ICD-10-CM | POA: Diagnosis not present

## 2017-04-26 DIAGNOSIS — M545 Low back pain: Secondary | ICD-10-CM | POA: Diagnosis not present

## 2017-05-03 DIAGNOSIS — M545 Low back pain: Secondary | ICD-10-CM | POA: Diagnosis not present

## 2017-05-10 DIAGNOSIS — M545 Low back pain: Secondary | ICD-10-CM | POA: Diagnosis not present

## 2017-05-17 DIAGNOSIS — M545 Low back pain: Secondary | ICD-10-CM | POA: Diagnosis not present

## 2017-05-25 DIAGNOSIS — M545 Low back pain: Secondary | ICD-10-CM | POA: Diagnosis not present

## 2017-05-31 ENCOUNTER — Ambulatory Visit: Payer: 59 | Admitting: Internal Medicine

## 2017-05-31 ENCOUNTER — Encounter: Payer: Self-pay | Admitting: Internal Medicine

## 2017-05-31 VITALS — BP 134/80 | HR 80 | Temp 97.5°F | Resp 18 | Ht 72.5 in | Wt 198.8 lb

## 2017-05-31 DIAGNOSIS — Z79899 Other long term (current) drug therapy: Secondary | ICD-10-CM

## 2017-05-31 DIAGNOSIS — Z136 Encounter for screening for cardiovascular disorders: Secondary | ICD-10-CM

## 2017-05-31 DIAGNOSIS — I10 Essential (primary) hypertension: Secondary | ICD-10-CM | POA: Diagnosis not present

## 2017-05-31 DIAGNOSIS — E782 Mixed hyperlipidemia: Secondary | ICD-10-CM

## 2017-05-31 DIAGNOSIS — Z23 Encounter for immunization: Secondary | ICD-10-CM | POA: Diagnosis not present

## 2017-05-31 DIAGNOSIS — Z0000 Encounter for general adult medical examination without abnormal findings: Secondary | ICD-10-CM

## 2017-05-31 DIAGNOSIS — M545 Low back pain: Secondary | ICD-10-CM | POA: Diagnosis not present

## 2017-05-31 DIAGNOSIS — Z1212 Encounter for screening for malignant neoplasm of rectum: Secondary | ICD-10-CM

## 2017-05-31 DIAGNOSIS — K219 Gastro-esophageal reflux disease without esophagitis: Secondary | ICD-10-CM

## 2017-05-31 DIAGNOSIS — Z125 Encounter for screening for malignant neoplasm of prostate: Secondary | ICD-10-CM

## 2017-05-31 DIAGNOSIS — Z111 Encounter for screening for respiratory tuberculosis: Secondary | ICD-10-CM | POA: Diagnosis not present

## 2017-05-31 DIAGNOSIS — E349 Endocrine disorder, unspecified: Secondary | ICD-10-CM

## 2017-05-31 DIAGNOSIS — Z0001 Encounter for general adult medical examination with abnormal findings: Secondary | ICD-10-CM

## 2017-05-31 DIAGNOSIS — R7303 Prediabetes: Secondary | ICD-10-CM

## 2017-05-31 DIAGNOSIS — E559 Vitamin D deficiency, unspecified: Secondary | ICD-10-CM

## 2017-05-31 DIAGNOSIS — I480 Paroxysmal atrial fibrillation: Secondary | ICD-10-CM

## 2017-05-31 DIAGNOSIS — R5383 Other fatigue: Secondary | ICD-10-CM

## 2017-05-31 DIAGNOSIS — Z1211 Encounter for screening for malignant neoplasm of colon: Secondary | ICD-10-CM

## 2017-05-31 NOTE — Progress Notes (Signed)
[name]   [name], M.D.     Jack Russell. Jack Russell, P.A.-C [name], [place]                [address] [name], [place] [address] Telephone [phone] Telefax [phone] Annual  Screening/Preventative Visit  & Comprehensive Evaluation & Examination     This very nice 56 y.o. MWM presents for a Screening/Preventative Visit & comprehensive evaluation and management of multiple medical co-morbidities.  Patient has been followed for HTN, Prediabetes, Hyperlipidemia and Vitamin D Deficiency.     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       HTN predates since 2008. Patient's BP has been controlled at home.  Today's BP is at goal - 134/80. Patient has hx/o pAfib (CHADsVasc = 2) since 2012  and recently was seen by Dr Johnsie Cancel recommending prn 1st Diltiazem and 2sd Flecanamide.  In discussion with Dr Rayann Heman, the patient relates that he has self d/c'd this Testosterone & feels that he has had no mor e palpitations. Patient denies any cardiac symptoms as chest pain, palpitations, shortness of breath, dizziness or ankle swelling.     Patient's hyperlipidemia is controlled with diet and medications. Patient denies myalgias or other medication SE's. Last lipids were not at goal:  Lab Results  Component Value Date   CHOL 209 (H) 02/13/2017   HDL 40 (L) 02/13/2017   LDLCALC 129 (H) 02/13/2017   TRIG 201 (H) 02/13/2017   CHOLHDL 5.2 (H) 02/13/2017      Patient has prediabetes (A1c 5.9% in  Apr 2016) and patient denies reactive hypoglycemic symptoms, visual blurring, diabetic polys or paresthesias. Last A1c was at goal: Lab Results  Component Value Date   HGBA1C 4.3 11/03/2016       Finally, patient has history of Vitamin D Deficiency ("17" in 2008) and last vitamin D was at  goal: Lab Results  Component Value Date   VD25OH 69 11/03/2016   Current Outpatient Medications on File Prior to Visit  Medication Sig  . Ascorbic Acid (VITAMIN C) 1000 MG tablet Take 1,000 mg by mouth daily.  Marland Kitchen aspirin 81 MG tablet Take 81 mg by mouth daily.  . cholecalciferol (VITAMIN D) 1000 UNITS tablet Take 1,000 Units by mouth daily.  Marland Kitchen CINNAMON PO Take by mouth.  . diltiazem (CARDIZEM) 30 MG tablet Take 1-2 tablets by mouth as needed for palpitations  . fish oil-omega-3 fatty acids 1000 MG capsule Take 2 g by mouth daily.  . flecainide (TAMBOCOR) 100 MG tablet Take 3 tablets by mouth as needed for onset of afib  . hydrocortisone (ANUSOL-HC) 2.5 % rectal cream Apply rectally 2 times daily  . hydrocortisone 2.5 % cream Apply 1 application topically daily as needed.  . pantoprazole (PROTONIX) 40 MG tablet Take 40 mg by mouth every other day.  . ranitidine (ZANTAC) 300 MG tablet TAKE 1 TABLET BY MOUTH ONCE TO TWICE DAILY AS NEEDED FOR INDIGESTION  . TURMERIC PO Take by mouth.   No current facility-administered medications on file prior to visit.    No Known Allergies Past Medical History:  Diagnosis Date  . Anemia   . Arthritis   . GERD (gastroesophageal reflux disease)   . Hyperlipidemia   . Other  testicular hypofunction   . Paroxysmal atrial fibrillation [facility])    Health Maintenance  Topic Date Due  . Hepatitis C Screening  [birth year]  . HIV Screening  04/[birth year]/1974  . INFLUENZA VACCINE  02/22/2017  . COLONOSCOPY  06/16/2019  . TETANUS/TDAP  06/27/2021   Immunization History  Administered Date(s) Administered  . Influenza Split 04/16/2013, 04/22/2014, 04/27/2015  . Influenza-Unspecified 04/18/2016  . PPD Test 04/22/2014, 04/27/2015, 05/03/2016  . Pneumococcal Polysaccharide-23 04/12/2012  . Tdap 06/28/2011   Past Surgical History:  Procedure Laterality Date  . COLONOSCOPY    . KNEE ARTHROSCOPY W/ MENISCAL REPAIR  2009   right   Family History  Problem Relation  Age of Onset  . Transient ischemic attack Mother   . Stroke Father   . Heart disease Father   . Hypertension Father   . Cancer Maternal Grandmother        colon   . Colon cancer Maternal Grandmother 37  . Stomach cancer Neg Hx   . Rectal cancer Neg Hx   . Esophageal cancer Neg Hx   . Liver cancer Neg Hx    Social History   Socioeconomic History  . Marital status: Married    Spouse name: Not on file  . Number of children: 2  . Years of education: Not on file  . Highest education level: Not on file  Social Needs  . Financial resource strain: Not on file  . Food insecurity - worry: Not on file  . Food insecurity - inability: Not on file  . Transportation needs - medical: Not on file  . Transportation needs - non-medical: Not on file  Occupational History  . Occupation: Development worker, international aid: [employer]  Tobacco Use  . Smoking status: Never Smoker  . Smokeless tobacco: Never Used  Substance and Sexual Activity  . Alcohol use: No  . Drug use: No  . Sexual activity: Not on file  Other Topics Concern  . Not on file  Social History Narrative   Works as a Insurance account manager for [employer] Constitutional: Denies fever, chills, weight loss/gain, headaches, insomnia,  night sweats or change in appetite. Does c/o fatigue. Eyes: Denies redness, blurred vision, diplopia, discharge, itchy or watery eyes.  ENT: Denies discharge, congestion, post nasal drip, epistaxis, sore throat, earache, hearing loss, dental pain, Tinnitus, Vertigo, Sinus pain or snoring.  Cardio: Denies chest pain, palpitations, irregular heartbeat, syncope, dyspnea, diaphoresis, orthopnea, PND, claudication or edema Respiratory: denies cough, dyspnea, DOE, pleurisy, hoarseness, laryngitis or wheezing.  Gastrointestinal: Denies dysphagia, heartburn, reflux, water brash, pain, cramps, nausea, vomiting, bloating, diarrhea, constipation, hematemesis, melena, hematochezia, jaundice or hemorrhoids Genitourinary: Denies  dysuria, frequency, urgency, nocturia, hesitancy, discharge, hematuria or flank pain Musculoskeletal: Denies arthralgia, myalgia, stiffness, Jt. Swelling, pain, limp or strain/sprain. Denies Falls. Skin: Denies puritis, rash, hives, warts, acne, eczema or change in skin lesion Neuro: No weakness, tremor, incoordination, spasms, paresthesia or pain Psychiatric: Denies confusion, memory loss or sensory loss. Denies Depression. Endocrine: Denies change in weight, skin, hair change, nocturia, and paresthesia, diabetic polys, visual blurring or hyper / hypo glycemic episodes.  Heme/Lymph: No excessive bleeding, bruising or enlarged lymph nodes.  Physical Exam  BP 134/80   Pulse 80   Temp (!) 97.5 F (36.4 C)   Resp 18   Ht 6' 0.5" (1.842 m)   Wt 198 lb 12.8 oz (90.2 kg)   BMI 26.59 kg/m   General Appearance: Well nourished and well groomed and in no apparent  distress.  Eyes: PERRLA, EOMs, conjunctiva no swelling or erythema, normal fundi and vessels. Sinuses: No frontal/maxillary tenderness ENT/Mouth: EACs patent / TMs  nl. Nares clear without erythema, swelling, mucoid exudates. Oral hygiene is good. No erythema, swelling, or exudate. Tongue normal, non-obstructing. Tonsils not swollen or erythematous. Hearing normal.  Neck: Supple, thyroid normal. No bruits, nodes or JVD. Respiratory: Respiratory effort normal.  BS equal and clear bilateral without rales, rhonci, wheezing or stridor. Cardio: Heart sounds are normal with regular rate and rhythm and no murmurs, rubs or gallops. Peripheral pulses are normal and equal bilaterally without edema. No aortic or femoral bruits. Chest: symmetric with normal excursions and percussion.  Abdomen: Soft, with Nl bowel sounds. Nontender, no guarding, rebound, hernias, masses, or organomegaly.  Lymphatics: Non tender without lymphadenopathy.  Genitourinary: No hernias.Testes nl. DRE - prostate nl for age - smooth & firm w/o nodules. Musculoskeletal: Full  ROM all peripheral extremities, joint stability, 5/5 strength, and normal gait. Skin: Warm and dry without rashes, lesions, cyanosis, clubbing or  ecchymosis.  Neuro: Cranial nerves intact, reflexes equal bilaterally. Normal muscle tone, no cerebellar symptoms. Sensation intact.  Pysch: Alert and oriented X 3 with normal affect, insight and judgment appropriate.   Assessment and Plan  1. Annual Preventative/Screening Exam   2. Essential hypertension  - EKG 12-Lead - [place], RETROPERITNL ABD,  LTD - Urinalysis, Routine w reflex microscopic - Microalbumin / creatinine urine ratio - CBC with Differential/Platelet - BASIC METABOLIC PANEL WITH GFR - Magnesium - TSH  3. Hyperlipidemia, mixed  - EKG 12-Lead - [place], RETROPERITNL ABD,  LTD - Hepatic function panel - Lipid panel - TSH  4. Prediabetes  - EKG 12-Lead - [place], RETROPERITNL ABD,  LTD - Hemoglobin A1c - Insulin, random  5. Vitamin D deficiency  - VITAMIN D 25 Hydroxy   6. Paroxysmal A-fib (HCC)  - TSH  7. Testosterone deficiency  - Testosterone  8. Gastroesophageal reflux disease  - CBC with Differential/Platelet  9. Screening for colorectal cancer  - POC Hemoccult Bld/Stl   10. Prostate cancer screening  - PSA  11. Screening examination for pulmonary tuberculosis  - PPD  12. Screening for ischemic heart disease  - EKG 12-Lead  13. Screening for AAA (aortic abdominal aneurysm)  - [place], RETROPERITNL ABD,  LTD  14. Fatigue  - Iron,Total/Total Iron Binding Cap - Vitamin B12 - Testosterone - CBC with Differential/Platelet - TSH  15. Medication management  - Urinalysis, Routine w reflex microscopic - Microalbumin / creatinine urine ratio - CBC with Differential/Platelet - BASIC METABOLIC PANEL WITH GFR - Hepatic function panel - Magnesium - Lipid panel - TSH - Hemoglobin A1c - Insulin, random - VITAMIN D 25 Hydroxy  16. Need for immunization against influenza  - FLU VACCINE MDCK QUAD  W/Preservative           Patient was counseled in prudent diet, weight control to achieve/maintain BMI less than 25, BP monitoring, regular exercise and medications as discussed.  Discussed med effects and SE's. Routine screening labs and tests as requested with regular follow-up as recommended. Over 40 minutes of exam, counseling, chart review and high complex critical decision making was performed

## 2017-05-31 NOTE — Patient Instructions (Signed)

## 2017-06-01 LAB — BASIC METABOLIC PANEL WITH GFR
BUN: 15 mg/dL (ref 7–25)
CO2: 30 mmol/L (ref 20–32)
CREATININE: 1.2 mg/dL (ref 0.70–1.33)
Calcium: 9.4 mg/dL (ref 8.6–10.3)
Chloride: 102 mmol/L (ref 98–110)
GFR, EST AFRICAN AMERICAN: 77 mL/min/{1.73_m2} (ref >=60)
GFR, EST NON AFRICAN AMERICAN: 66 mL/min/{1.73_m2} (ref >=60)
GLUCOSE: 111 mg/dL — AB (ref 65–99)
Potassium: 4.4 mmol/L (ref 3.5–5.3)
SODIUM: 140 mmol/L (ref 135–146)

## 2017-06-01 LAB — CBC WITH DIFFERENTIAL/PLATELET
BASOS ABS: 69 {cells}/uL (ref 0–200)
Basophils Relative: 1.5 %
EOS ABS: 221 {cells}/uL (ref 15–500)
Eosinophils Relative: 4.8 %
HCT: 45 % (ref 38.5–50.0)
HEMOGLOBIN: 15.5 g/dL (ref 13.2–17.1)
Lymphs Abs: 1458 cells/uL (ref 850–3900)
MCH: 30.7 pg (ref 27.0–33.0)
MCHC: 34.4 g/dL (ref 32.0–36.0)
MCV: 89.1 fL (ref 80.0–100.0)
MONOS PCT: 7.2 %
MPV: 10.5 fL (ref 7.5–12.5)
NEUTROS ABS: 2521 {cells}/uL (ref 1500–7800)
Neutrophils Relative %: 54.8 %
Platelets: 228 10*3/uL (ref 140–400)
RBC: 5.05 10*6/uL (ref 4.20–5.80)
RDW: 12 % (ref 11.0–15.0)
Total Lymphocyte: 31.7 %
WBC mixed population: 331 cells/uL (ref 200–950)
WBC: 4.6 10*3/uL (ref 3.8–10.8)

## 2017-06-01 LAB — VITAMIN B12: VITAMIN B 12: 1991 pg/mL — AB (ref 200–1100)

## 2017-06-01 LAB — MICROALBUMIN / CREATININE URINE RATIO
CREATININE, URINE: 60 mg/dL (ref 20–320)
MICROALB UR: 0.2 mg/dL
Microalb Creat Ratio: 3 mcg/mg creat (ref <30)

## 2017-06-01 LAB — URINALYSIS, ROUTINE W REFLEX MICROSCOPIC
Bilirubin Urine: NEGATIVE
Glucose, UA: NEGATIVE
Hgb urine dipstick: NEGATIVE
Ketones, ur: NEGATIVE
Leukocytes, UA: NEGATIVE
NITRITE: NEGATIVE
Protein, ur: NEGATIVE
SPECIFIC GRAVITY, URINE: 1.009 (ref 1.001–1.03)
pH: <=5 (ref 5.0–8.0)

## 2017-06-01 LAB — LIPID PANEL
CHOL/HDL RATIO: 4.5 (calc) (ref <5.0)
Cholesterol: 216 mg/dL — ABNORMAL HIGH (ref <200)
HDL: 48 mg/dL (ref >40)
LDL Cholesterol (Calc): 132 mg/dL (calc) — ABNORMAL HIGH
NON-HDL CHOLESTEROL (CALC): 168 mg/dL — AB (ref <130)
Triglycerides: 225 mg/dL — ABNORMAL HIGH (ref <150)

## 2017-06-01 LAB — HEPATIC FUNCTION PANEL
AG RATIO: 1.7 (calc) (ref 1.0–2.5)
ALKALINE PHOSPHATASE (APISO): 53 U/L (ref 40–115)
ALT: 18 U/L (ref 9–46)
AST: 17 U/L (ref 10–35)
Albumin: 4.4 g/dL (ref 3.6–5.1)
BILIRUBIN DIRECT: 0.1 mg/dL (ref 0.0–0.2)
Globulin: 2.6 g/dL (calc) (ref 1.9–3.7)
Indirect Bilirubin: 0.3 mg/dL (calc) (ref 0.2–1.2)
TOTAL PROTEIN: 7 g/dL (ref 6.1–8.1)
Total Bilirubin: 0.4 mg/dL (ref 0.2–1.2)

## 2017-06-01 LAB — INSULIN, RANDOM: Insulin: 33.5 u[IU]/mL — ABNORMAL HIGH (ref 2.0–19.6)

## 2017-06-01 LAB — TESTOSTERONE: Testosterone: 304 ng/dL (ref 250–827)

## 2017-06-01 LAB — HEMOGLOBIN A1C
Hgb A1c MFr Bld: 5.2 % of total Hgb (ref <5.7)
Mean Plasma Glucose: 103 (calc)
eAG (mmol/L): 5.7 (calc)

## 2017-06-01 LAB — PSA: PSA: 1 ng/mL (ref <=4.0)

## 2017-06-01 LAB — VITAMIN D 25 HYDROXY (VIT D DEFICIENCY, FRACTURES): Vit D, 25-Hydroxy: 42 ng/mL (ref 30–100)

## 2017-06-01 LAB — IRON, TOTAL/TOTAL IRON BINDING CAP
%SAT: 26 % (calc) (ref 15–60)
Iron: 79 ug/dL (ref 50–180)
TIBC: 300 ug/dL (ref 250–425)

## 2017-06-01 LAB — TSH: TSH: 3.61 m[IU]/L (ref 0.40–4.50)

## 2017-06-01 LAB — MAGNESIUM: Magnesium: 2.1 mg/dL (ref 1.5–2.5)

## 2017-06-07 DIAGNOSIS — M545 Low back pain: Secondary | ICD-10-CM | POA: Diagnosis not present

## 2017-06-21 DIAGNOSIS — M545 Low back pain: Secondary | ICD-10-CM | POA: Diagnosis not present

## 2017-06-28 DIAGNOSIS — M545 Low back pain: Secondary | ICD-10-CM | POA: Diagnosis not present

## 2017-07-07 ENCOUNTER — Encounter: Payer: Self-pay | Admitting: Internal Medicine

## 2017-07-12 ENCOUNTER — Encounter: Payer: Self-pay | Admitting: Internal Medicine

## 2017-07-12 ENCOUNTER — Ambulatory Visit (INDEPENDENT_AMBULATORY_CARE_PROVIDER_SITE_OTHER): Payer: 59 | Admitting: Internal Medicine

## 2017-07-12 VITALS — BP 110/76 | HR 65 | Ht 72.5 in | Wt 198.6 lb

## 2017-07-12 DIAGNOSIS — I480 Paroxysmal atrial fibrillation: Secondary | ICD-10-CM

## 2017-07-12 NOTE — Patient Instructions (Addendum)

## 2017-07-12 NOTE — Progress Notes (Signed)
PCP: [name], MD   Primary EP: Dr Rayann Heman  Jack Russell is a 56 y.o. male who presents today for routine electrophysiology followup.  Since last being seen in our clinic, the patient reports doing very well.  Only a single episode of afib 06/24/17 (no known triggers) for which he took flecainide and his AF terminated within 3 hours.  No other episodes. Today, he denies symptoms of palpitations, chest pain, shortness of breath,  lower extremity edema, dizziness, presyncope, or syncope.  The patient is otherwise without complaint today.   Past Medical History:  Diagnosis Date  . Anemia   . Arthritis   . GERD (gastroesophageal reflux disease)   . Hyperlipidemia   . Other testicular hypofunction   . Paroxysmal atrial fibrillation [facility])    Past Surgical History:  Procedure Laterality Date  . CHONDROPLASTY Left 09/15/2016   Procedure: CHONDROPLASTY;  Surgeon: Ninetta Lights, MD;  Location: [place];  Service: Orthopedics;  Laterality: Left;  . COLONOSCOPY    . KNEE ARTHROSCOPY W/ MENISCAL REPAIR  2009   right  . KNEE ARTHROSCOPY WITH MEDIAL MENISECTOMY Left 09/15/2016   Procedure: LEFT KNEE ARTHROSCOPY CHONDROPLASTY  WITH MEDIAL MENISECTOMY;  Surgeon: Ninetta Lights, MD;  Location: [place];  Service: Orthopedics;  Laterality: Left;  . PROXIMAL INTERPHALANGEAL FUSION (PIP) Left 05/14/2013   Procedure: RECONSTRUCTION BOUTONNIERE (LEFT SMALL FINGER) PINNING PROXIMAL INTERPHALANGEAL FUSION (PIP);  Surgeon: Wynonia Sours, MD;  Location: [place];  Service: Orthopedics;  Laterality: Left;    ROS- all systems are reviewed and negatives except as per HPI above  Current Outpatient Medications  Medication Sig Dispense Refill  . Ascorbic Acid (VITAMIN C) 1000 MG tablet Take 1,000 mg by mouth daily.    Marland Kitchen aspirin 81 MG tablet Take 81 mg by mouth daily.    . cholecalciferol (VITAMIN D) 1000 UNITS tablet Take 1,000 Units by mouth daily.      Marland Kitchen CINNAMON PO Take by mouth.    . diltiazem (CARDIZEM) 30 MG tablet Take 1-2 tablets by mouth as needed for palpitations 30 tablet 1  . fish oil-omega-3 fatty acids 1000 MG capsule Take 2 g by mouth daily.    . flecainide (TAMBOCOR) 100 MG tablet Take 3 tablets by mouth as needed for onset of afib 9 tablet 1  . hydrocortisone (ANUSOL-HC) 2.5 % rectal cream Apply rectally 2 times daily 30 g 1  . hydrocortisone 2.5 % cream Apply 1 application topically daily as needed.    . pantoprazole (PROTONIX) 40 MG tablet Take 40 mg by mouth every other day.    . ranitidine (ZANTAC) 300 MG tablet TAKE 1 TABLET BY MOUTH ONCE TO TWICE DAILY AS NEEDED FOR INDIGESTION 180 tablet 1  . TURMERIC PO Take by mouth.     No current facility-administered medications for this visit.     Physical Exam: Vitals:   07/12/17 1620  BP: 110/76  Pulse: 65  SpO2: 99%  Weight: 198 lb 9.6 oz (90.1 kg)  Height: 6' 0.5" (1.842 m)    GEN- The patient is well appearing, alert and oriented x 3 today.   Head- normocephalic, atraumatic Eyes-  Sclera clear, conjunctiva pink Ears- hearing intact Oropharynx- clear Lungs- Clear to ausculation bilaterally, normal work of breathing Heart- Regular rate and rhythm, no murmurs, rubs or gallops, PMI not laterally displaced GI- soft, NT, ND, + BS Extremities- no clubbing, cyanosis, or edema  EKG tracing ordered today is personally  reviewed and shows sinus rhythm, normal ekg  Assessment and Plan:  1. Paroxysmal atrial fibrillation He is pleased with "pill in pocket" flecainide No changes today chads2vasc score is 0.  Return in a year AF clinic flyer given to the patient  Thompson Grayer MD, [facility] 07/12/2017 4:45 PM

## 2017-08-16 ENCOUNTER — Other Ambulatory Visit: Payer: Self-pay

## 2017-08-16 DIAGNOSIS — Z1212 Encounter for screening for malignant neoplasm of rectum: Secondary | ICD-10-CM | POA: Diagnosis not present

## 2017-08-16 DIAGNOSIS — Z1211 Encounter for screening for malignant neoplasm of colon: Secondary | ICD-10-CM

## 2017-08-16 LAB — POC HEMOCCULT BLD/STL (HOME/3-CARD/SCREEN)
Card #3 Fecal Occult Blood, POC: NEGATIVE
FECAL OCCULT BLD: NEGATIVE
FECAL OCCULT BLD: NEGATIVE

## 2017-12-11 NOTE — Progress Notes (Signed)
FOLLOW UP  Assessment and Plan:   Paroxysmal a fib Rate controlled today Continue with current medications for rate control, ASA Followed by Dr. Johnsie Cancel  Hypertension Well controlled with current medications  Monitor blood pressure at home; patient to call if consistently greater than 130/80 Continue DASH diet.   Reminder to go to the ER if any CP, SOB, nausea, dizziness, severe HA, changes vision/speech, left arm numbness and tingling and jaw pain.  Cholesterol Currently mildly above goal; working on lifestyle Continue low cholesterol diet and exercise.  Check lipid panel.   Other abnormal glucose Continue diet and exercise.  Perform daily foot/skin check, notify office of any concerning changes.  Check CMP/GFR  BMI 26 Long discussion about weight loss, diet, and exercise Recommended diet heavy in fruits and veggies and low in animal meats, cheeses, and dairy products, appropriate calorie intake Discussed ideal weight for height Will follow up in 3 months  Vitamin D Def Below goal at last visit; continue supplementation for goal of 70-100 Defer Vit D level  GERD Well managed on current medications; doing well on three times weekly  Discussed diet, avoiding triggers and other lifestyle changes   Continue diet and meds as discussed. Further disposition pending results of labs. Discussed med's effects and SE's.   Over 30 minutes of exam, counseling, chart review, and critical decision making was performed.   Future Appointments  Date Time Provider [place]  07/03/2018  2:00 PM [name], MD [facility] None    ----------------------------------------------------------------------------------------------------------------------  HPI 57 y.o. male  presents for 6 month follow up on hypertension, cholesterol, glucose management, weight and vitamin D deficiency. In Mar 2018,  He was found severely anemic and EGD (Dr Ardis Hughs) found gastrititis  & DU  attributed to Mobic and patient was tx'd with Protonix/Carafate. Patient has hx/o pAfib (CHADsVasc = 0) since 2012  and recently transitioned to flecainide PRN plan and is doing well without further known episodes since last episode in 06/2017 - resolved within 2 hours after taking flecainide.   BMI is Body mass index is 26.46 kg/m., he has been working on diet and exercise. He has set a goal of 185 lb by the end of this. He has started walking.  Wt Readings from Last 3 Encounters:  12/12/17 197 lb 12.8 oz (89.7 kg)  07/12/17 198 lb 9.6 oz (90.1 kg)  05/31/17 198 lb 12.8 oz (90.2 kg)   His blood pressure has been controlled at home, today their BP is BP: 122/82  He does workout. He denies chest pain, shortness of breath, dizziness.   He is not on cholesterol medication and denies myalgias. His cholesterol is not at goal. The cholesterol last visit was:   Lab Results  Component Value Date   CHOL 216 (H) 05/31/2017   HDL 48 05/31/2017   LDLCALC 132 (H) 05/31/2017   TRIG 225 (H) 05/31/2017   CHOLHDL 4.5 05/31/2017    He has been working on diet and exercise for glucose management, and denies foot ulcerations, increased appetite, nausea, paresthesia of the feet, polydipsia, polyuria, visual disturbances, vomiting and weight loss. Last A1C in the office was:  Lab Results  Component Value Date   HGBA1C 5.2 05/31/2017   Patient is on Vitamin D supplement   Lab Results  Component Value Date   VD25OH 42 05/31/2017        Current Medications:  Current Outpatient Medications on File Prior to Visit  Medication Sig  . Ascorbic Acid (VITAMIN C) 1000 MG  tablet Take 500 mg by mouth daily.   Marland Kitchen aspirin 81 MG tablet Take 81 mg by mouth daily.  . CHELATED ZINC PO Take 30 mg by mouth 2 (two) times daily.  . cholecalciferol (VITAMIN D) 1000 UNITS tablet Take 5,000 Units by mouth daily.   Marland Kitchen CINNAMON PO Take 1,000 mg by mouth. Take 1000mg  by mouth BID  . Cyanocobalamin (VITAMIN B-12 PO) Take 3,000  mcg by mouth.  . fish oil-omega-3 fatty acids 1000 MG capsule Take 2 g by mouth daily.  Marland Kitchen GARLIC PO Take by mouth 2 (two) times daily.  . pantoprazole (PROTONIX) 40 MG tablet Take 40 mg by mouth every other day. Take one tablet by mouth three days a week  . TURMERIC PO Take by mouth.  . diltiazem (CARDIZEM) 30 MG tablet Take 1-2 tablets by mouth as needed for palpitations (Patient not taking: Reported on 12/12/2017)  . flecainide (TAMBOCOR) 100 MG tablet Take 3 tablets by mouth as needed for onset of afib (Patient not taking: Reported on 12/12/2017)  . hydrocortisone 2.5 % cream Apply 1 application topically daily as needed.  . ranitidine (ZANTAC) 300 MG tablet TAKE 1 TABLET BY MOUTH ONCE TO TWICE DAILY AS NEEDED FOR INDIGESTION (Patient not taking: Reported on 12/12/2017)   No current facility-administered medications on file prior to visit.      Allergies: No Known Allergies   Medical History:  Past Medical History:  Diagnosis Date  . Anemia   . Arthritis   . GERD (gastroesophageal reflux disease)   . Hyperlipidemia   . Other testicular hypofunction   . Paroxysmal atrial fibrillation (HCC)    Family history- Reviewed and unchanged Social history- Reviewed and unchanged   Review of Systems:  Review of Systems  Constitutional: Negative for malaise/fatigue and weight loss.  HENT: Negative for hearing loss and tinnitus.   Eyes: Negative for blurred vision and double vision.  Respiratory: Negative for cough, shortness of breath and wheezing.   Cardiovascular: Negative for chest pain, palpitations, orthopnea, claudication and leg swelling.  Gastrointestinal: Negative for abdominal pain, blood in stool, constipation, diarrhea, heartburn, melena, nausea and vomiting.  Genitourinary: Negative.   Musculoskeletal: Negative for joint pain and myalgias.  Skin: Negative for rash.  Neurological: Negative for dizziness, tingling, sensory change, weakness and headaches.  Endo/Heme/Allergies:  Negative for polydipsia.  Psychiatric/Behavioral: Negative.   All other systems reviewed and are negative.     Physical Exam: BP 122/82   Pulse 71   Temp (!) 97.5 F (36.4 C)   Ht 6' 0.5" (1.842 m)   Wt 197 lb 12.8 oz (89.7 kg)   SpO2 97%   BMI 26.46 kg/m  Wt Readings from Last 3 Encounters:  12/12/17 197 lb 12.8 oz (89.7 kg)  07/12/17 198 lb 9.6 oz (90.1 kg)  05/31/17 198 lb 12.8 oz (90.2 kg)   General Appearance: Well nourished, in no apparent distress. Eyes: PERRLA, EOMs, conjunctiva no swelling or erythema Sinuses: No Frontal/maxillary tenderness ENT/Mouth: Ext aud canals clear, TMs without erythema, bulging. No erythema, swelling, or exudate on post pharynx.  Tonsils not swollen or erythematous. Hearing normal.  Neck: Supple, thyroid normal.  Respiratory: Respiratory effort normal, BS equal bilaterally without rales, rhonchi, wheezing or stridor.  Cardio: RRR with no MRGs. Brisk peripheral pulses without edema.  Abdomen: Soft, + BS.  Non tender, no guarding, rebound, hernias, masses. Lymphatics: Non tender without lymphadenopathy.  Musculoskeletal: Full ROM, 5/5 strength, Normal gait Skin: Warm, dry without rashes, lesions, ecchymosis.  Neuro:  Cranial nerves intact. No cerebellar symptoms.  Psych: Awake and oriented X 3, normal affect, Insight and Judgment appropriate.    Izora Ribas, NP 4:34 PM [facility]

## 2017-12-12 ENCOUNTER — Ambulatory Visit: Payer: 59 | Admitting: Adult Health

## 2017-12-12 ENCOUNTER — Encounter: Payer: Self-pay | Admitting: Adult Health

## 2017-12-12 VITALS — BP 122/82 | HR 71 | Temp 97.5°F | Ht 72.5 in | Wt 197.8 lb

## 2017-12-12 DIAGNOSIS — R7309 Other abnormal glucose: Secondary | ICD-10-CM | POA: Diagnosis not present

## 2017-12-12 DIAGNOSIS — Z79899 Other long term (current) drug therapy: Secondary | ICD-10-CM

## 2017-12-12 DIAGNOSIS — K219 Gastro-esophageal reflux disease without esophagitis: Secondary | ICD-10-CM

## 2017-12-12 DIAGNOSIS — I10 Essential (primary) hypertension: Secondary | ICD-10-CM

## 2017-12-12 DIAGNOSIS — Z6826 Body mass index (BMI) 26.0-26.9, adult: Secondary | ICD-10-CM

## 2017-12-12 DIAGNOSIS — E559 Vitamin D deficiency, unspecified: Secondary | ICD-10-CM | POA: Diagnosis not present

## 2017-12-12 DIAGNOSIS — E782 Mixed hyperlipidemia: Secondary | ICD-10-CM | POA: Diagnosis not present

## 2017-12-12 DIAGNOSIS — I480 Paroxysmal atrial fibrillation: Secondary | ICD-10-CM | POA: Diagnosis not present

## 2017-12-12 NOTE — Patient Instructions (Signed)
Aim for 7+ servings of fruits and vegetables daily  80+ fluid ounces of water or unsweet tea for healthy kidneys  Limit alcohol intake  Limit animal fats in diet for cholesterol and heart health - choose grass fed whenever available  Aim for low stress - take time to unwind and care for your mental health  Aim for 150 min of moderate intensity exercise weekly for heart health, and weights twice weekly for bone/muscle health  Aim for 7-9 hours of sleep daily      When it comes to diets, agreement about the perfect plan isn't easy to find, even among the experts. Experts at the [employer] developed an idea known as the Healthy Eating Plate. Just imagine a plate divided into logical, healthy portions.  The emphasis is on diet quality:  Load up on vegetables and fruits - one-half of your plate: Aim for color and variety, and remember that potatoes don't count.  Go for whole grains - one-quarter of your plate: Whole wheat, barley, wheat berries, quinoa, oats, brown rice, and foods made with them. If you want pasta, go with whole wheat pasta.  Protein power - one-quarter of your plate: Fish, chicken, beans, and nuts are all healthy, versatile protein sources. Limit red meat.  The diet, however, does go beyond the plate, offering a few other suggestions.  Use healthy plant oils, such as olive, canola, soy, corn, sunflower and peanut. Check the labels, and avoid partially hydrogenated oil, which have unhealthy trans fats.  If you're thirsty, drink water. Coffee and tea are good in moderation, but skip sugary drinks and limit milk and dairy products to one or two daily servings.  The type of carbohydrate in the diet is more important than the amount. Some sources of carbohydrates, such as vegetables, fruits, whole grains, and beans-are healthier than others.  Finally, stay active.

## 2017-12-13 LAB — COMPLETE METABOLIC PANEL WITH GFR
AG Ratio: 1.5 (calc) (ref 1.0–2.5)
ALT: 14 U/L (ref 9–46)
AST: 17 U/L (ref 10–35)
Albumin: 4.3 g/dL (ref 3.6–5.1)
Alkaline phosphatase (APISO): 61 U/L (ref 40–115)
BUN: 16 mg/dL (ref 7–25)
CALCIUM: 9.5 mg/dL (ref 8.6–10.3)
CO2: 27 mmol/L (ref 20–32)
Chloride: 105 mmol/L (ref 98–110)
Creat: 0.99 mg/dL (ref 0.70–1.33)
GFR, EST NON AFRICAN AMERICAN: 84 mL/min/{1.73_m2} (ref >=60)
GFR, Est African American: 97 mL/min/{1.73_m2} (ref >=60)
GLUCOSE: 98 mg/dL (ref 65–99)
Globulin: 2.8 g/dL (calc) (ref 1.9–3.7)
Potassium: 4 mmol/L (ref 3.5–5.3)
Sodium: 139 mmol/L (ref 135–146)
TOTAL PROTEIN: 7.1 g/dL (ref 6.1–8.1)
Total Bilirubin: 0.3 mg/dL (ref 0.2–1.2)

## 2017-12-13 LAB — LIPID PANEL
Cholesterol: 213 mg/dL — ABNORMAL HIGH (ref <200)
HDL: 40 mg/dL — ABNORMAL LOW (ref >40)
LDL Cholesterol (Calc): 129 mg/dL (calc) — ABNORMAL HIGH
Non-HDL Cholesterol (Calc): 173 mg/dL (calc) — ABNORMAL HIGH (ref <130)
Total CHOL/HDL Ratio: 5.3 (calc) — ABNORMAL HIGH (ref <5.0)
Triglycerides: 284 mg/dL — ABNORMAL HIGH (ref <150)

## 2017-12-13 LAB — CBC WITH DIFFERENTIAL/PLATELET
BASOS ABS: 88 {cells}/uL (ref 0–200)
BASOS PCT: 1.8 %
EOS ABS: 299 {cells}/uL (ref 15–500)
Eosinophils Relative: 6.1 %
HCT: 41.9 % (ref 38.5–50.0)
Hemoglobin: 14.3 g/dL (ref 13.2–17.1)
LYMPHS ABS: 1769 {cells}/uL (ref 850–3900)
MCH: 29.3 pg (ref 27.0–33.0)
MCHC: 34.1 g/dL (ref 32.0–36.0)
MCV: 85.9 fL (ref 80.0–100.0)
MONOS PCT: 7.1 %
MPV: 10.7 fL (ref 7.5–12.5)
NEUTROS ABS: 2396 {cells}/uL (ref 1500–7800)
Neutrophils Relative %: 48.9 %
PLATELETS: 250 10*3/uL (ref 140–400)
RBC: 4.88 10*6/uL (ref 4.20–5.80)
RDW: 12.1 % (ref 11.0–15.0)
Total Lymphocyte: 36.1 %
WBC mixed population: 348 cells/uL (ref 200–950)
WBC: 4.9 10*3/uL (ref 3.8–10.8)

## 2017-12-13 LAB — TSH: TSH: 3.26 mIU/L (ref 0.40–4.50)

## 2017-12-13 LAB — MAGNESIUM: Magnesium: 2 mg/dL (ref 1.5–2.5)

## 2018-01-12 ENCOUNTER — Other Ambulatory Visit: Payer: Self-pay | Admitting: Internal Medicine

## 2018-02-01 ENCOUNTER — Encounter: Payer: Self-pay | Admitting: Internal Medicine

## 2018-04-15 ENCOUNTER — Other Ambulatory Visit: Payer: Self-pay | Admitting: Gastroenterology

## 2018-05-16 ENCOUNTER — Other Ambulatory Visit: Payer: Self-pay | Admitting: Gastroenterology

## 2018-05-21 ENCOUNTER — Ambulatory Visit: Payer: 59 | Admitting: Adult Health

## 2018-05-21 ENCOUNTER — Ambulatory Visit
Admission: RE | Admit: 2018-05-21 | Discharge: 2018-05-21 | Disposition: A | Discharge status: Home / Self Care | Payer: 59 | Source: Ambulatory Visit | Attending: Adult Health | Admitting: Adult Health

## 2018-05-21 ENCOUNTER — Encounter: Payer: Self-pay | Admitting: Adult Health

## 2018-05-21 ENCOUNTER — Other Ambulatory Visit: Payer: Self-pay | Admitting: Adult Health

## 2018-05-21 VITALS — BP 124/80 | HR 88 | Temp 97.2°F | Ht 72.5 in | Wt 196.0 lb

## 2018-05-21 DIAGNOSIS — R10813 Right lower quadrant abdominal tenderness: Secondary | ICD-10-CM | POA: Diagnosis not present

## 2018-05-21 DIAGNOSIS — R197 Diarrhea, unspecified: Secondary | ICD-10-CM | POA: Diagnosis not present

## 2018-05-21 DIAGNOSIS — R1031 Right lower quadrant pain: Secondary | ICD-10-CM | POA: Diagnosis not present

## 2018-05-21 DIAGNOSIS — R10817 Generalized abdominal tenderness: Secondary | ICD-10-CM | POA: Diagnosis not present

## 2018-05-21 DIAGNOSIS — K5732 Diverticulitis of large intestine without perforation or abscess without bleeding: Secondary | ICD-10-CM

## 2018-05-21 DIAGNOSIS — R911 Solitary pulmonary nodule: Secondary | ICD-10-CM | POA: Insufficient documentation

## 2018-05-21 DIAGNOSIS — K5730 Diverticulosis of large intestine without perforation or abscess without bleeding: Secondary | ICD-10-CM | POA: Diagnosis not present

## 2018-05-21 HISTORY — DX: Diverticulitis of large intestine without perforation or abscess without bleeding: K57.32

## 2018-05-21 LAB — CBC WITH DIFFERENTIAL/PLATELET
BASOS ABS: 89 {cells}/uL (ref 0–200)
BASOS PCT: 0.8 %
EOS ABS: 56 {cells}/uL (ref 15–500)
Eosinophils Relative: 0.5 %
HCT: 42.6 % (ref 38.5–50.0)
Hemoglobin: 14.6 g/dL (ref 13.2–17.1)
Lymphs Abs: 1410 cells/uL (ref 850–3900)
MCH: 30.2 pg (ref 27.0–33.0)
MCHC: 34.3 g/dL (ref 32.0–36.0)
MCV: 88 fL (ref 80.0–100.0)
MONOS PCT: 6.7 %
MPV: 10.6 fL (ref 7.5–12.5)
NEUTROS PCT: 79.3 %
Neutro Abs: 8802 cells/uL — ABNORMAL HIGH (ref 1500–7800)
PLATELETS: 239 10*3/uL (ref 140–400)
RBC: 4.84 10*6/uL (ref 4.20–5.80)
RDW: 12 % (ref 11.0–15.0)
TOTAL LYMPHOCYTE: 12.7 %
WBC: 11.1 10*3/uL — ABNORMAL HIGH (ref 3.8–10.8)
WBCMIX: 744 {cells}/uL (ref 200–950)

## 2018-05-21 LAB — COMPLETE METABOLIC PANEL WITH GFR
AG RATIO: 1.4 (calc) (ref 1.0–2.5)
ALKALINE PHOSPHATASE (APISO): 62 U/L (ref 40–115)
ALT: 13 U/L (ref 9–46)
AST: 15 U/L (ref 10–35)
Albumin: 4.2 g/dL (ref 3.6–5.1)
BILIRUBIN TOTAL: 1.1 mg/dL (ref 0.2–1.2)
BUN: 14 mg/dL (ref 7–25)
CHLORIDE: 99 mmol/L (ref 98–110)
CO2: 30 mmol/L (ref 20–32)
Calcium: 9.2 mg/dL (ref 8.6–10.3)
Creat: 1.08 mg/dL (ref 0.70–1.33)
GFR, EST AFRICAN AMERICAN: 87 mL/min/{1.73_m2} (ref >=60)
GFR, Est Non African American: 75 mL/min/{1.73_m2} (ref >=60)
Globulin: 2.9 g/dL (calc) (ref 1.9–3.7)
Glucose, Bld: 101 mg/dL — ABNORMAL HIGH (ref 65–99)
POTASSIUM: 4 mmol/L (ref 3.5–5.3)
Sodium: 138 mmol/L (ref 135–146)
TOTAL PROTEIN: 7.1 g/dL (ref 6.1–8.1)

## 2018-05-21 MED ORDER — METRONIDAZOLE 500 MG PO TABS: 500.0000 mg | ORAL_TABLET | Freq: Three times a day (TID) | ORAL | 0 refills | Status: DC

## 2018-05-21 MED ORDER — IOPAMIDOL (ISOVUE-300) INJECTION 61%
100.0000 mL | Freq: Once | INTRAVENOUS | Status: AC | PRN
  Administered 2018-05-21: 100 mL via INTRAVENOUS

## 2018-05-21 MED ORDER — ONDANSETRON HCL 4 MG PO TABS: 4.0000 mg | ORAL_TABLET | Freq: Every day | ORAL | 1 refills | Status: DC | PRN

## 2018-05-21 MED ORDER — CIPROFLOXACIN HCL 500 MG PO TABS: 500.0000 mg | ORAL_TABLET | Freq: Two times a day (BID) | ORAL | 0 refills | Status: DC

## 2018-05-21 NOTE — Progress Notes (Signed)
Assessment and Plan:  Jack Russell was seen today for follow-up.  Diagnoses and all orders for this visit:  Diverticulitis of sigmoid colon Continue cipro/flagyl, can progress diet to include bland soft liquids and foods, information on diet for diverticulitis provided.  Follow up on leukocytosis Continue close monitoring of symptoms and slow progression of diet, follow up if new pain or concerns Will have follow up with GI after 6 months as he will be due for colonoscopy -     CBC with Differential/Platelet  Further disposition pending results of labs. Discussed med's effects and SE's.   Over 15 minutes of exam, counseling, chart review, and critical decision making was performed.   Future Appointments  Date Time Provider Fisher  07/03/2018  2:00 PM [name], MD [facility] None    ------------------------------------------------------------------------------------------------------------------   HPI BP 120/80   Pulse 95   Temp (!) 96.4 F (35.8 C)   Ht 6' 0.5" (1.842 m)   Wt 189 lb (85.7 kg)   SpO2 98%   BMI 25.28 kg/m   57 y.o.male presents for 48 hour follow up after CT abd/pelv obtained for 4 days of RLQ abdominal pain revealed moderate sigmoid diverticulitis with microperforations. He was initiated on cipro/flagyl, and reports since starting medication has had mild discomfort only. Doing well with clear liquid diet.   Past Medical History:  Diagnosis Date  . Anemia   . Arthritis   . GERD (gastroesophageal reflux disease)   . Hyperlipidemia   . Other testicular hypofunction   . Paroxysmal atrial fibrillation (HCC)      No Known Allergies  Current Outpatient Medications on File Prior to Visit  Medication Sig  . Ascorbic Acid (VITAMIN C) 1000 MG tablet Take 500 mg by mouth daily.   Marland Kitchen aspirin 81 MG tablet Take 81 mg by mouth daily.  . CHELATED ZINC PO Take 30 mg by mouth 2 (two) times daily.  . cholecalciferol (VITAMIN D) 1000 UNITS tablet Take 5,000  Units by mouth daily.   Marland Kitchen CINNAMON PO Take 1,000 mg by mouth. Take 1000mg  by mouth BID  . ciprofloxacin (CIPRO) 500 MG tablet Take 1 tablet (500 mg total) by mouth 2 (two) times daily.  . Cyanocobalamin (VITAMIN B-12 PO) Take 3,000 mcg by mouth.  . diltiazem (CARDIZEM) 30 MG tablet Take 1-2 tablets by mouth as needed for palpitations  . fish oil-omega-3 fatty acids 1000 MG capsule Take 2 g by mouth daily.  . flecainide (TAMBOCOR) 100 MG tablet Take 3 tablets by mouth as needed for onset of afib  . GARLIC PO Take by mouth 2 (two) times daily.  . metroNIDAZOLE (FLAGYL) 500 MG tablet Take 1 tablet (500 mg total) by mouth 3 (three) times daily.  . ondansetron (ZOFRAN) 4 MG tablet Take 1 tablet (4 mg total) by mouth daily as needed for nausea or vomiting.  . pantoprazole (PROTONIX) 40 MG tablet Take 40 mg by mouth every other day. Take one tablet by mouth three days a week  . ranitidine (ZANTAC) 300 MG tablet TAKE 1 TABLET BY MOUTH ONCE TO TWICE DAILY AS NEEDED FOR INDIGESTION  . TURMERIC PO Take by mouth.   No current facility-administered medications on file prior to visit.     ROS: all negative except above.   Physical Exam:  BP 120/80   Pulse 95   Temp (!) 96.4 F (35.8 C)   Ht 6' 0.5" (1.842 m)   Wt 189 lb (85.7 kg)   SpO2 98%   BMI  25.28 kg/m   General Appearance: Well nourished, in no apparent distress. Eyes: conjunctiva no swelling or erythema ENT/Mouth: Hearing normal.  Neck: Supple.  Respiratory: Respiratory effort normal, BS equal bilaterally without rales, rhonchi, wheezing or stridor.  Cardio: RRR with no MRGs. Brisk peripheral pulses without edema.  Abdomen: Soft, + BS.  Mildly tender through lower abdomen, no guarding, rebound, hernias, masses. Lymphatics: Non tender without lymphadenopathy.  Musculoskeletal: normal gait.  Skin: Warm, dry without rashes, lesions, ecchymosis.  Neuro: Normal muscle tone, Sensation intact.  Psych: Awake and oriented X 3, normal  affect, Insight and Judgment appropriate.     Izora Ribas, NP 10:49 AM [facility]

## 2018-05-21 NOTE — Progress Notes (Signed)
Assessment and Plan:  Jack Russell was seen today for abdominal pain.  Diagnoses and all orders for this visit:  RLQ abdominal pain/ Generalized abdominal tenderness without rebound tenderness Will obtain STAT CT today to r/o appendicitis, diverticulitis or abscess, will check labs ? Colitis will obtain stool specimen if unclear etiology via CT and initial workup He is due for repeat colonoscopy next year; will refer to GI for evaluation if our workup doesn't yield firm diagnosis and symptoms continue  Emphasized clear liquids until imaging results, Directed to go to the ER if any severe AB pain, unable to hold down food/water, blood in stool or vomit, chest pain, shortness of breath, or any worsening symptoms.  -     CBC with Differential/Platelet -     COMPLETE METABOLIC PANEL WITH GFR -     Urinalysis w microscopic + reflex cultur -     CT Abdomen Pelvis W Contrast; Future  Diarrhea of presumed infectious origin -     Gastrointestinal Pathogen Panel PCR  Other orders -     ondansetron (ZOFRAN) 4 MG tablet; Take 1 tablet (4 mg total) by mouth daily as needed for nausea or vomiting.  Further disposition pending results of labs. Discussed med's effects and SE's.   Over 30 minutes of exam, counseling, chart review, and critical decision making was performed.   Future Appointments  Date Time Provider Andrew  07/03/2018  2:00 PM [name], MD [facility] None    ------------------------------------------------------------------------------------------------------------------   HPI 57 y.o.male with hx of GERD presents accompanied by his wife for evaluation of lower abdominal pain and bloating that began 4 days ago, coming and going, having severe RLQ cramping pain, "worse than my ulcer", reports 7-8/10, comes in waves of several hours. Pain not associated with eating, positioning. He endorses a mild sense of fever, mild chills, and has had mild nausea without emesis. He reports  in the last 2 days stools have been watery with mucus, but denies hematochezia or melena. Denies urinary changes.   Has taken some tylenol (1000 mg) and hyosciamine (old script), zofran - unsure what helped but is feeling slightly better compared to this AM.   He was able to eat some chicken noodle soup, crackers, ginger ale yesterday, has been holding down fluids.    Normal colonoscopy in 05/2009, EGD 10/11/2016 by Dr. Ardis Hughs with mild gastritis and single non-bleeding ulcer  No hx of abdominal surgeries  Past Medical History:  Diagnosis Date  . Anemia   . Arthritis   . GERD (gastroesophageal reflux disease)   . Hyperlipidemia   . Other testicular hypofunction   . Paroxysmal atrial fibrillation (HCC)      No Known Allergies  Current Outpatient Medications on File Prior to Visit  Medication Sig  . Ascorbic Acid (VITAMIN C) 1000 MG tablet Take 500 mg by mouth daily.   Marland Kitchen aspirin 81 MG tablet Take 81 mg by mouth daily.  . CHELATED ZINC PO Take 30 mg by mouth 2 (two) times daily.  . cholecalciferol (VITAMIN D) 1000 UNITS tablet Take 5,000 Units by mouth daily.   Marland Kitchen CINNAMON PO Take 1,000 mg by mouth. Take 1000mg  by mouth BID  . Cyanocobalamin (VITAMIN B-12 PO) Take 3,000 mcg by mouth.  . fish oil-omega-3 fatty acids 1000 MG capsule Take 2 g by mouth daily.  . flecainide (TAMBOCOR) 100 MG tablet Take 3 tablets by mouth as needed for onset of afib  . GARLIC PO Take by mouth 2 (two) times daily.  Marland Kitchen  pantoprazole (PROTONIX) 40 MG tablet Take 40 mg by mouth every other day. Take one tablet by mouth three days a week  . ranitidine (ZANTAC) 300 MG tablet TAKE 1 TABLET BY MOUTH ONCE TO TWICE DAILY AS NEEDED FOR INDIGESTION  . TURMERIC PO Take by mouth.  . diltiazem (CARDIZEM) 30 MG tablet Take 1-2 tablets by mouth as needed for palpitations (Patient not taking: Reported on 05/21/2018)   No current facility-administered medications on file prior to visit.     ROS: Review of Systems   Constitutional: Positive for chills and fever. Negative for diaphoresis, malaise/fatigue and weight loss.  HENT: Negative.   Eyes: Negative.   Respiratory: Negative.   Cardiovascular: Negative.   Gastrointestinal: Positive for abdominal pain, diarrhea and nausea. Negative for blood in stool, constipation, heartburn, melena and vomiting.  Genitourinary: Negative for dysuria, flank pain, frequency, hematuria and urgency.  Musculoskeletal: Negative for joint pain and myalgias.  Skin: Negative for rash.  Neurological: Negative for dizziness and headaches.  Endo/Heme/Allergies: Negative for polydipsia.     Physical Exam:  BP 124/80   Pulse 88   Temp (!) 97.2 F (36.2 C)   Ht 6' 0.5" (1.842 m)   Wt 196 lb (88.9 kg)   SpO2 99%   BMI 26.22 kg/m   General Appearance: Well nourished, in no apparent distress. Eyes: PERRLA, conjunctiva no swelling or erythema ENT/Mouth: Ext aud canals clear, TMs without erythema, bulging. No erythema, swelling, or exudate on post pharynx.  Tonsils not swollen or erythematous. Hearing normal.  Neck: Supple. Respiratory: Respiratory effort normal, BS equal bilaterally without rales, rhonchi, wheezing or stridor.  Cardio: RRR with no MRGs. Brisk peripheral pulses without edema.  Abdomen: Soft, + BS.  Generalized tenderness to light palpation, worst in RLQ with guarding but without rebound, no palpable hernias or masses. Lymphatics: Non tender without lymphadenopathy.  Musculoskeletal: symmetrical strength, normal gait.  Skin: Warm, dry without rashes, lesions, ecchymosis.  Neuro: Cranial nerves intact. Normal muscle tone, no cerebellar symptoms. Sensation intact.  Psych: Awake and oriented X 3, normal affect, Insight and Judgment appropriate.     Izora Ribas, NP 11:12 AM [facility]

## 2018-05-21 NOTE — Progress Notes (Signed)
Ct results indicate moderate sigmoid diverticulitis with micro-perforation. Spoke with patient on phone to communicate results and plan, will initiate cipro and flagyl, clear liquid diet, strong ER precautions given in the interim and will do close follow up in 48 hours.

## 2018-05-21 NOTE — Patient Instructions (Signed)
We are setting you up for a CT to rule out appendicitis, diverticulitis, abscess  I've sent in zofran for nausea, but please avoid food for today until the CT results  Please go to the ER if you have any severe AB pain, unable to hold down food/water, blood in stool or vomit, chest pain, shortness of breath, or any worsening symptoms.    Abdominal Pain, Adult Many things can cause belly (abdominal) pain. Most times, belly pain is not dangerous. Many cases of belly pain can be watched and treated at home. Sometimes belly pain is serious, though. Your doctor will try to find the cause of your belly pain. Follow these instructions at home:  Take over-the-counter and prescription medicines only as told by your doctor. Do not take medicines that help you poop (laxatives) unless told to by your doctor.  Drink enough fluid to keep your pee (urine) clear or pale yellow.  Watch your belly pain for any changes.  Keep all follow-up visits as told by your doctor. This is important. Contact a doctor if:  Your belly pain changes or gets worse.  You are not hungry, or you lose weight without trying.  You are having trouble pooping (constipated) or have watery poop (diarrhea) for more than 2-3 days.  You have pain when you pee or poop.  Your belly pain wakes you up at night.  Your pain gets worse with meals, after eating, or with certain foods.  You are throwing up and cannot keep anything down.  You have a fever. Get help right away if:  Your pain does not go away as soon as your doctor says it should.  You cannot stop throwing up.  Your pain is only in areas of your belly, such as the right side or the left lower part of the belly.  You have bloody or black poop, or poop that looks like tar.  You have very bad pain, cramping, or bloating in your belly.  You have signs of not having enough fluid or water in your body (dehydration), such as: ? Dark pee, very little pee, or no  pee. ? Cracked lips. ? Dry mouth. ? Sunken eyes. ? Sleepiness. ? Weakness. This information is not intended to replace advice given to you by your health care provider. Make sure you discuss any questions you have with your health care provider. Document Released: 12/28/2007 Document Revised: 01/29/2016 Document Reviewed: 12/23/2015 Elsevier Interactive Patient Education  2018 [employer].

## 2018-05-23 ENCOUNTER — Ambulatory Visit (INDEPENDENT_AMBULATORY_CARE_PROVIDER_SITE_OTHER): Payer: 59 | Admitting: Adult Health

## 2018-05-23 ENCOUNTER — Encounter: Payer: Self-pay | Admitting: Adult Health

## 2018-05-23 VITALS — BP 120/80 | HR 95 | Temp 96.4°F | Ht 72.5 in | Wt 189.0 lb

## 2018-05-23 DIAGNOSIS — K5732 Diverticulitis of large intestine without perforation or abscess without bleeding: Secondary | ICD-10-CM | POA: Diagnosis not present

## 2018-05-23 DIAGNOSIS — R911 Solitary pulmonary nodule: Secondary | ICD-10-CM

## 2018-05-23 LAB — CBC WITH DIFFERENTIAL/PLATELET
BASOS PCT: 1 %
Basophils Absolute: 71 cells/uL (ref 0–200)
Eosinophils Absolute: 192 cells/uL (ref 15–500)
Eosinophils Relative: 2.7 %
HEMATOCRIT: 40.7 % (ref 38.5–50.0)
Hemoglobin: 13.8 g/dL (ref 13.2–17.1)
LYMPHS ABS: 1164 {cells}/uL (ref 850–3900)
MCH: 29.8 pg (ref 27.0–33.0)
MCHC: 33.9 g/dL (ref 32.0–36.0)
MCV: 87.9 fL (ref 80.0–100.0)
MPV: 10.6 fL (ref 7.5–12.5)
Monocytes Relative: 11.3 %
NEUTROS ABS: 4871 {cells}/uL (ref 1500–7800)
Neutrophils Relative %: 68.6 %
Platelets: 277 10*3/uL (ref 140–400)
RBC: 4.63 10*6/uL (ref 4.20–5.80)
RDW: 12.2 % (ref 11.0–15.0)
Total Lymphocyte: 16.4 %
WBC: 7.1 10*3/uL (ref 3.8–10.8)
WBCMIX: 802 {cells}/uL (ref 200–950)

## 2018-05-23 NOTE — Patient Instructions (Signed)
Diverticulitis °Diverticulitis is infection or inflammation of small pouches (diverticula) in the colon that form due to a condition called diverticulosis. Diverticula can trap stool (feces) and bacteria, causing infection and inflammation. °Diverticulitis may cause severe stomach pain and diarrhea. It may lead to tissue damage in the colon that causes bleeding. The diverticula may also burst (rupture) and cause infected stool to enter other areas of the abdomen. °Complications of diverticulitis can include: °· Bleeding. °· Severe infection. °· Severe pain. °· Rupture (perforation) of the colon. °· Blockage (obstruction) of the colon. ° °What are the causes? °This condition is caused by stool becoming trapped in the diverticula, which allows bacteria to grow in the diverticula. This leads to inflammation and infection. °What increases the risk? °You are more likely to develop this condition if: °· You have diverticulosis. The risk for diverticulosis increases if: °? You are overweight or obese. °? You use tobacco products. °? You do not get enough exercise. °· You eat a diet that does not include enough fiber. High-fiber foods include fruits, vegetables, beans, nuts, and whole grains. ° °What are the signs or symptoms? °Symptoms of this condition may include: °· Pain and tenderness in the abdomen. The pain is normally located on the left side of the abdomen, but it may occur in other areas. °· Fever and chills. °· Bloating. °· Cramping. °· Nausea. °· Vomiting. °· Changes in bowel routines. °· Blood in your stool. ° °How is this diagnosed? °This condition is diagnosed based on: °· Your medical history. °· A physical exam. °· Tests to make sure there is nothing else causing your condition. These tests may include: °? Blood tests. °? Urine tests. °? Imaging tests of the abdomen, including X-rays, ultrasounds, MRIs, or CT scans. ° °How is this treated? °Most cases of this condition are mild and can be treated at home.  Treatment may include: °· Taking over-the-counter pain medicines. °· Following a clear liquid diet. °· Taking antibiotic medicines by mouth. °· Rest. ° °More severe cases may need to be treated at a hospital. Treatment may include: °· Not eating or drinking. °· Taking prescription pain medicine. °· Receiving antibiotic medicines through an IV tube. °· Receiving fluids and nutrition through an IV tube. °· Surgery. ° °When your condition is under control, your health care provider may recommend that you have a colonoscopy. This is an exam to look at the entire large intestine. During the exam, a lubricated, bendable tube is inserted into the anus and then passed into the rectum, colon, and other parts of the large intestine. A colonoscopy can show how severe your diverticula are and whether something else may be causing your symptoms. °Follow these instructions at home: °Medicines °· Take over-the-counter and prescription medicines only as told by your health care provider. These include fiber supplements, probiotics, and stool softeners. °· If you were prescribed an antibiotic medicine, take it as told by your health care provider. Do not stop taking the antibiotic even if you start to feel better. °· Do not drive or use heavy machinery while taking prescription pain medicine. °General instructions °· Follow a full liquid diet or another diet as directed by your health care provider. After your symptoms improve, your health care provider may tell you to change your diet. He or she may recommend that you eat a diet that contains at least 25 g (25 grams) of fiber daily. Fiber makes it easier to pass stool. Healthy sources of fiber include: °? Berries. One cup   contains 4-8 grams of fiber. °? Beans or lentils. One half cup contains 5-8 grams of fiber. °? Green vegetables. One cup contains 4 grams of fiber. °· Exercise for at least 30 minutes, 3 times each week. You should exercise hard enough to raise your heart rate and  break a sweat. °· Keep all follow-up visits as told by your health care provider. This is important. You may need a colonoscopy. °Contact a health care provider if: °· Your pain does not improve. °· You have a hard time drinking or eating food. °· Your bowel movements do not return to normal. °Get help right away if: °· Your pain gets worse. °· Your symptoms do not get better with treatment. °· Your symptoms suddenly get worse. °· You have a fever. °· You vomit more than one time. °· You have stools that are bloody, black, or tarry. °Summary °· Diverticulitis is infection or inflammation of small pouches (diverticula) in the colon that form due to a condition called diverticulosis. Diverticula can trap stool (feces) and bacteria, causing infection and inflammation. °· You are at higher risk for this condition if you have diverticulosis and you eat a diet that does not include enough fiber. °· Most cases of this condition are mild and can be treated at home. More severe cases may need to be treated at a hospital. °· When your condition is under control, your health care provider may recommend that you have an exam called a colonoscopy. This exam can show how severe your diverticula are and whether something else may be causing your symptoms. °This information is not intended to replace advice given to you by your health care provider. Make sure you discuss any questions you have with your health care provider. °Document Released: 04/20/2005 Document Revised: 08/13/2016 Document Reviewed: 08/13/2016 °Elsevier Interactive Patient Education © 2018 [employer] ° °

## 2018-07-02 ENCOUNTER — Encounter: Payer: Self-pay | Admitting: Internal Medicine

## 2018-07-02 DIAGNOSIS — Z8249 Family history of ischemic heart disease and other diseases of the circulatory system: Secondary | ICD-10-CM | POA: Insufficient documentation

## 2018-07-02 DIAGNOSIS — R7303 Prediabetes: Secondary | ICD-10-CM

## 2018-07-02 DIAGNOSIS — R5383 Other fatigue: Secondary | ICD-10-CM | POA: Insufficient documentation

## 2018-07-02 DIAGNOSIS — Z111 Encounter for screening for respiratory tuberculosis: Secondary | ICD-10-CM | POA: Insufficient documentation

## 2018-07-02 NOTE — Progress Notes (Signed)
[facility]   [name], M.D.     Uvaldo Bristle. [name], P.A.-C [name], [place]                [address] [place], [place] [address] Telephone [phone] Telefax [phone] Annual  Screening/Preventative Visit  & Comprehensive Evaluation & Examination     This very nice 57 y.o. MWM presents for a Screening /Preventative Visit & comprehensive evaluation and management of multiple medical co-morbidities.  Patient has been followed for HTN, HLD, Prediabetes and Vitamin D Deficiency.     Patient was recently treated for Diverticulitis (05/21/2018) with Cipro/Flagyl. CT showed moderate sigmoid Diverticulitis w/micro-perforation. His abdominal pains have resolved, but he does endorse occasional BRRB with BM's. In Mar 2018, EGD found gastritis & DU attributed to Meloxicam and he's been on Protonix & Zantac since.      HTN predates circa 2008. Patient's BP has been controlled at home.  Today's BP is at goal -  126/80. He also has hx/o pAfib (2012) and was seen by Dr Johnsie Cancel recc Diltiazem and Flecanamide as "pill in pocket" rescue.  Patient denies any cardiac symptoms as chest pain, palpitations, shortness of breath, dizziness or ankle swelling.     Patient's hyperlipidemia is not controlled with diet.  Lab Results  Component Value Date   CHOL 213 (H) 12/12/2017   HDL 40 (L) 12/12/2017   LDLCALC 129 (H) 12/12/2017   TRIG 284 (H) 12/12/2017   CHOLHDL 5.3 (H) 12/12/2017      Patient has hx/o prediabetes  (A1c 5.9% / Apr 2016) and patient denies reactive hypoglycemic symptoms, visual blurring, diabetic polys or paresthesias. Last A1c was Normal & at goal: Lab Results  Component Value Date   HGBA1C 5.2 05/31/2017       Finally, patient has history of Vitamin D Deficiency  ("17" / 2008) and last vitamin D was improved , but still low (goal 70-100): Lab Results  Component Value Date   VD25OH  42 05/31/2017   Current Outpatient Medications on File Prior to Visit  Medication Sig  . Ascorbic Acid (VITAMIN C) 1000 MG tablet Take 500 mg by mouth daily.   Marland Kitchen aspirin 81 MG tablet Take 81 mg by mouth daily.  . CHELATED ZINC PO Take 30 mg by mouth 2 (two) times daily.  . cholecalciferol (VITAMIN D) 1000 UNITS tablet Take 5,000 Units by mouth daily.   Marland Kitchen CINNAMON PO Take 1,000 mg by mouth. Take 1000mg  by mouth BID  . Cyanocobalamin (VITAMIN B-12 PO) Take 3,000 mcg by mouth.  . fish oil-omega-3 fatty acids 1000 MG capsule Take 2 g by mouth daily.  . flecainide (TAMBOCOR) 100 MG tablet Take 3 tablets by mouth as needed for onset of afib  . GARLIC PO Take by mouth 2 (two) times daily.  . pantoprazole (PROTONIX) 40 MG tablet Take 40 mg by mouth every other day. Take one tablet by mouth three days a week  . ranitidine (ZANTAC) 300 MG tablet TAKE 1 TABLET BY MOUTH ONCE TO TWICE DAILY AS NEEDED FOR INDIGESTION  . TURMERIC PO Take by mouth.   No current facility-administered medications on file prior to visit.    No Known Allergies Past Medical History:  Diagnosis Date  . Anemia   . Arthritis   . GERD (gastroesophageal reflux disease)   . Hyperlipidemia   . Other  testicular hypofunction   . Paroxysmal atrial fibrillation [facility])    Health Maintenance  Topic Date Due  . Hepatitis C Screening  12-25-1960  . HIV Screening  04/15/1974  . COLONOSCOPY  06/16/2019  . TETANUS/TDAP  06/27/2021  . INFLUENZA VACCINE  Completed   Immunization History  Administered Date(s) Administered  . Influenza Inj Mdck Quad With Preservative 05/31/2017  . Influenza Split 04/16/2013, 04/22/2014, 04/27/2015  . Influenza-Unspecified 04/18/2016, 04/24/2018  . PPD Test 04/22/2014, 04/27/2015, 05/03/2016, 05/31/2017  . Pneumococcal Polysaccharide-23 04/12/2012  . Tdap 06/28/2011   Last Colon - 06/15/2009 - Dr Sharlett Iles recc 10 yr f/u.   Past Surgical History:  Procedure Laterality Date  . CHONDROPLASTY Left  09/15/2016   Procedure: CHONDROPLASTY;  Surgeon: Ninetta Lights, MD;  Location: [facility];  Service: Orthopedics;  Laterality: Left;  . COLONOSCOPY    . KNEE ARTHROSCOPY W/ MENISCAL REPAIR  2009   right  . KNEE ARTHROSCOPY WITH MEDIAL MENISECTOMY Left 09/15/2016   Procedure: LEFT KNEE ARTHROSCOPY CHONDROPLASTY  WITH MEDIAL MENISECTOMY;  Surgeon: Ninetta Lights, MD;  Location: [place];  Service: Orthopedics;  Laterality: Left;  . PROXIMAL INTERPHALANGEAL FUSION (PIP) Left 05/14/2013   Procedure: RECONSTRUCTION BOUTONNIERE (LEFT SMALL FINGER) PINNING PROXIMAL INTERPHALANGEAL FUSION (PIP);  Surgeon: Wynonia Sours, MD;  Location: [place];  Service: Orthopedics;  Laterality: Left;   Family History  Problem Relation Age of Onset  . Transient ischemic attack Mother   . Stroke Father   . Heart disease Father   . Hypertension Father   . Cancer Maternal Grandmother        colon   . Colon cancer Maternal Grandmother 18  . Stomach cancer Neg Hx   . Rectal cancer Neg Hx   . Esophageal cancer Neg Hx   . Liver cancer Neg Hx    Social History   Socioeconomic History  . Marital status: Married    Spouse name: Not on file  . Number of children: 2  . Years of education: Not on file  . Highest education level: Not on file  Occupational History  . Occupation: Development worker, international aid: [employer]  Tobacco Use  . Smoking status: Never Smoker  . Smokeless tobacco: Never Used  Substance and Sexual Activity  . Alcohol use: No  . Drug use: No  . Sexual activity: Not on file  Social History Narrative   Works as a Insurance account manager for [employer] Constitutional: Denies fever, chills, weight loss/gain, headaches, insomnia,  night sweats or change in appetite. Does c/o fatigue. Eyes: Denies redness, blurred vision, diplopia, discharge, itchy or watery eyes.  ENT: Denies discharge, congestion, post nasal drip, epistaxis, sore throat, earache, hearing loss,  dental pain, Tinnitus, Vertigo, Sinus pain or snoring.  Cardio: Denies chest pain, palpitations, irregular heartbeat, syncope, dyspnea, diaphoresis, orthopnea, PND, claudication or edema Respiratory: denies cough, dyspnea, DOE, pleurisy, hoarseness, laryngitis or wheezing.  Gastrointestinal: Denies dysphagia, heartburn, reflux, water brash, pain, cramps, nausea, vomiting, bloating, diarrhea, constipation, hematemesis, melena, jaundice or hemorrhoids, but reports occasional hematochezia as above. Genitourinary: Denies dysuria, frequency, urgency, nocturia, hesitancy, discharge, hematuria or flank pain Musculoskeletal: Denies arthralgia, myalgia, stiffness, Jt. Swelling, pain, limp or strain/sprain. Denies Falls. Skin: Denies puritis, rash, hives, warts, acne, eczema or change in skin lesion Neuro: No weakness, tremor, incoordination, spasms, paresthesia or pain Psychiatric: Denies confusion, memory loss or sensory loss. Denies Depression. Endocrine: Denies change in weight, skin, hair change, nocturia, and  paresthesia, diabetic polys, visual blurring or hyper / hypo glycemic episodes.  Heme/Lymph: No excessive bleeding, bruising or enlarged lymph nodes.  Physical Exam  BP 126/80   Pulse 80   Temp (!) 97.3 F (36.3 C)   Resp 16   Ht 6\' 1"  (1.854 m)   Wt 191 lb (86.6 kg)   BMI 25.20 kg/m   General Appearance: Well nourished and well groomed and in no apparent distress.  Eyes: PERRLA, EOMs, conjunctiva no swelling or erythema, normal fundi and vessels. Sinuses: No frontal/maxillary tenderness ENT/Mouth: EACs patent / TMs  nl. Nares clear without erythema, swelling, mucoid exudates. Oral hygiene is good. No erythema, swelling, or exudate. Tongue normal, non-obstructing. Tonsils not swollen or erythematous. Hearing normal.  Neck: Supple, thyroid not palpable. No bruits, nodes or JVD. Respiratory: Respiratory effort normal.  BS equal and clear bilateral without rales, rhonci, wheezing or  stridor. Cardio: Heart sounds are normal with regular rate and rhythm and no murmurs, rubs or gallops. Peripheral pulses are normal and equal bilaterally without edema. No aortic or femoral bruits. Chest: symmetric with normal excursions and percussion.  Abdomen: Soft, with Nl bowel sounds. Nontender, no guarding, rebound, hernias, masses, or organomegaly.  Lymphatics: Non tender without lymphadenopathy.  Musculoskeletal: Full ROM all peripheral extremities, joint stability, 5/5 strength, and normal gait. Skin: Warm and dry without rashes, lesions, cyanosis, clubbing or  ecchymosis.  Neuro: Cranial nerves intact, reflexes equal bilaterally. Normal muscle tone, no cerebellar symptoms. Sensation intact.  Pysch: Alert and oriented X 3 with normal affect, insight and judgment appropriate.   Assessment and Plan  1. Annual Preventative/Screening Exam   2. Essential hypertension  - EKG 12-Lead - [place], RETROPERITNL ABD,  LTD - Urinalysis, Routine w reflex microscopic - Microalbumin / creatinine urine ratio - CBC with Differential/Platelet - COMPLETE METABOLIC PANEL WITH GFR - Magnesium - TSH  3. Hyperlipidemia, mixed  - EKG 12-Lead - [place], RETROPERITNL ABD,  LTD - Lipid panel - TSH  4. Prediabetes  - EKG 12-Lead - [place], RETROPERITNL ABD,  LTD - Hemoglobin A1c - Insulin, random  5. Vitamin D deficiency  - VITAMIN D 25 Hydroxyl  6. Testosterone deficiency  - Testosterone  7. Gastroesophageal reflux disease  - CBC with Differential/Platelet  8. Paroxysmal A-fib (HCC)  - EKG 12-Lead - TSH  9. Screening for colorectal cancer  - Ambulatory referral to Gastroenterology  10. Bright red rectal bleeding  - Ambulatory referral to Gastroenterology  11. Prostate cancer screening   12. Screening examination for pulmonary tuberculosis  - PPD  13. Screening for ischemic heart disease  - EKG 12-Lead  14. FHx: heart disease  - EKG 12-Lead - [place], RETROPERITNL ABD,   LTD  15. Screening for AAA (aortic abdominal aneurysm)  - [place], RETROPERITNL ABD,  LTD  16. Fatigue, unspecified type  - Iron,Total/Total Iron Binding Cap - Vitamin B12 - Testosterone - CBC with Differential/Platelet  17. Medication management  - Urinalysis, Routine w reflex microscopic - Microalbumin / creatinine urine ratio - CBC with Differential/Platelet - COMPLETE METABOLIC PANEL WITH GFR - Magnesium - Lipid panel - TSH - Hemoglobin A1c - Insulin, random - VITAMIN D 25 Hydroxyl        Patient was counseled in prudent diet, weight control to achieve/maintain BMI less than 25, BP monitoring, regular exercise and medications as discussed.  Discussed med effects and SE's. Routine screening labs and tests as requested with regular follow-up as recommended. Over 40 minutes of exam, counseling, chart review and high  complex critical decision making was performed

## 2018-07-03 ENCOUNTER — Encounter: Payer: Self-pay | Admitting: Internal Medicine

## 2018-07-03 ENCOUNTER — Ambulatory Visit: Payer: 59 | Admitting: Internal Medicine

## 2018-07-03 VITALS — BP 126/80 | HR 80 | Temp 97.3°F | Resp 16 | Ht 73.0 in | Wt 191.0 lb

## 2018-07-03 DIAGNOSIS — Z0001 Encounter for general adult medical examination with abnormal findings: Secondary | ICD-10-CM

## 2018-07-03 DIAGNOSIS — R5383 Other fatigue: Secondary | ICD-10-CM

## 2018-07-03 DIAGNOSIS — E559 Vitamin D deficiency, unspecified: Secondary | ICD-10-CM

## 2018-07-03 DIAGNOSIS — Z136 Encounter for screening for cardiovascular disorders: Secondary | ICD-10-CM

## 2018-07-03 DIAGNOSIS — Z8249 Family history of ischemic heart disease and other diseases of the circulatory system: Secondary | ICD-10-CM

## 2018-07-03 DIAGNOSIS — Z111 Encounter for screening for respiratory tuberculosis: Secondary | ICD-10-CM

## 2018-07-03 DIAGNOSIS — E349 Endocrine disorder, unspecified: Secondary | ICD-10-CM

## 2018-07-03 DIAGNOSIS — Z0000 Encounter for general adult medical examination without abnormal findings: Secondary | ICD-10-CM

## 2018-07-03 DIAGNOSIS — I10 Essential (primary) hypertension: Secondary | ICD-10-CM | POA: Diagnosis not present

## 2018-07-03 DIAGNOSIS — E782 Mixed hyperlipidemia: Secondary | ICD-10-CM

## 2018-07-03 DIAGNOSIS — K219 Gastro-esophageal reflux disease without esophagitis: Secondary | ICD-10-CM

## 2018-07-03 DIAGNOSIS — I480 Paroxysmal atrial fibrillation: Secondary | ICD-10-CM

## 2018-07-03 DIAGNOSIS — Z79899 Other long term (current) drug therapy: Secondary | ICD-10-CM

## 2018-07-03 DIAGNOSIS — Z1212 Encounter for screening for malignant neoplasm of rectum: Secondary | ICD-10-CM

## 2018-07-03 DIAGNOSIS — K625 Hemorrhage of anus and rectum: Secondary | ICD-10-CM

## 2018-07-03 DIAGNOSIS — Z125 Encounter for screening for malignant neoplasm of prostate: Secondary | ICD-10-CM

## 2018-07-03 DIAGNOSIS — R7303 Prediabetes: Secondary | ICD-10-CM

## 2018-07-03 DIAGNOSIS — Z1211 Encounter for screening for malignant neoplasm of colon: Secondary | ICD-10-CM

## 2018-07-04 ENCOUNTER — Other Ambulatory Visit: Payer: Self-pay | Admitting: Internal Medicine

## 2018-07-04 DIAGNOSIS — E782 Mixed hyperlipidemia: Secondary | ICD-10-CM

## 2018-07-04 LAB — HEMOGLOBIN A1C
Hgb A1c MFr Bld: 5.3 % of total Hgb (ref <5.7)
Mean Plasma Glucose: 105 (calc)
eAG (mmol/L): 5.8 (calc)

## 2018-07-04 LAB — COMPLETE METABOLIC PANEL WITHOUT GFR
AG Ratio: 1.6 (calc) (ref 1.0–2.5)
ALT: 15 U/L (ref 9–46)
AST: 14 U/L (ref 10–35)
Albumin: 4.5 g/dL (ref 3.6–5.1)
Alkaline phosphatase (APISO): 64 U/L (ref 40–115)
BUN: 14 mg/dL (ref 7–25)
CO2: 27 mmol/L (ref 20–32)
Calcium: 9.5 mg/dL (ref 8.6–10.3)
Chloride: 104 mmol/L (ref 98–110)
Creat: 0.82 mg/dL (ref 0.70–1.33)
GFR, Est African American: 112 mL/min/1.73m2
GFR, Est Non African American: 97 mL/min/1.73m2
Globulin: 2.9 g/dL (ref 1.9–3.7)
Glucose, Bld: 86 mg/dL (ref 65–99)
Potassium: 4.3 mmol/L (ref 3.5–5.3)
Sodium: 141 mmol/L (ref 135–146)
Total Bilirubin: 0.3 mg/dL (ref 0.2–1.2)
Total Protein: 7.4 g/dL (ref 6.1–8.1)

## 2018-07-04 LAB — URINALYSIS, ROUTINE W REFLEX MICROSCOPIC
Bilirubin Urine: NEGATIVE
GLUCOSE, UA: NEGATIVE
Hgb urine dipstick: NEGATIVE
KETONES UR: NEGATIVE
LEUKOCYTES UA: NEGATIVE
Nitrite: NEGATIVE
PH: 5.5 (ref 5.0–8.0)
PROTEIN: NEGATIVE
SPECIFIC GRAVITY, URINE: 1.01 (ref 1.001–1.03)

## 2018-07-04 LAB — MICROALBUMIN / CREATININE URINE RATIO
Creatinine, Urine: 54 mg/dL (ref 20–320)
Microalb Creat Ratio: 4 ug/mg{creat}
Microalb, Ur: 0.2 mg/dL

## 2018-07-04 LAB — CBC WITH DIFFERENTIAL/PLATELET
Basophils Absolute: 102 cells/uL (ref 0–200)
Basophils Relative: 2 %
EOS ABS: 332 {cells}/uL (ref 15–500)
Eosinophils Relative: 6.5 %
HCT: 41.8 % (ref 38.5–50.0)
Hemoglobin: 14.1 g/dL (ref 13.2–17.1)
Lymphs Abs: 1479 cells/uL (ref 850–3900)
MCH: 29.8 pg (ref 27.0–33.0)
MCHC: 33.7 g/dL (ref 32.0–36.0)
MCV: 88.4 fL (ref 80.0–100.0)
MPV: 10.6 fL (ref 7.5–12.5)
Monocytes Relative: 7.1 %
Neutro Abs: 2825 cells/uL (ref 1500–7800)
Neutrophils Relative %: 55.4 %
PLATELETS: 249 10*3/uL (ref 140–400)
RBC: 4.73 10*6/uL (ref 4.20–5.80)
RDW: 12.6 % (ref 11.0–15.0)
TOTAL LYMPHOCYTE: 29 %
WBC: 5.1 10*3/uL (ref 3.8–10.8)
WBCMIX: 362 {cells}/uL (ref 200–950)

## 2018-07-04 LAB — TESTOSTERONE: Testosterone: 396 ng/dL (ref 250–827)

## 2018-07-04 LAB — VITAMIN D 25 HYDROXY (VIT D DEFICIENCY, FRACTURES): VIT D 25 HYDROXY: 65 ng/mL (ref 30–100)

## 2018-07-04 LAB — MAGNESIUM: Magnesium: 2.2 mg/dL (ref 1.5–2.5)

## 2018-07-04 LAB — LIPID PANEL
Cholesterol: 213 mg/dL — ABNORMAL HIGH (ref <200)
HDL: 43 mg/dL (ref >40)
LDL Cholesterol (Calc): 136 mg/dL (calc) — ABNORMAL HIGH
Non-HDL Cholesterol (Calc): 170 mg/dL (calc) — ABNORMAL HIGH (ref <130)
Total CHOL/HDL Ratio: 5 (calc) — ABNORMAL HIGH (ref <5.0)
Triglycerides: 198 mg/dL — ABNORMAL HIGH (ref <150)

## 2018-07-04 LAB — INSULIN, RANDOM: INSULIN: 12.4 u[IU]/mL (ref 2.0–19.6)

## 2018-07-04 LAB — VITAMIN B12

## 2018-07-04 LAB — IRON, TOTAL/TOTAL IRON BINDING CAP
%SAT: 16 % — ABNORMAL LOW (ref 20–48)
Iron: 51 ug/dL (ref 50–180)
TIBC: 317 ug/dL (ref 250–425)

## 2018-07-04 LAB — TSH: TSH: 3.61 mIU/L (ref 0.40–4.50)

## 2018-07-04 MED ORDER — ROSUVASTATIN CALCIUM 40 MG PO TABS: ORAL_TABLET | ORAL | 1 refills | Status: DC

## 2018-07-05 LAB — TB SKIN TEST
Induration: 0 mm
TB SKIN TEST: NEGATIVE

## 2018-07-08 ENCOUNTER — Encounter: Payer: Self-pay | Admitting: Internal Medicine

## 2018-08-22 ENCOUNTER — Ambulatory Visit (INDEPENDENT_AMBULATORY_CARE_PROVIDER_SITE_OTHER): Payer: 59 | Admitting: Gastroenterology

## 2018-08-22 ENCOUNTER — Encounter: Payer: Self-pay | Admitting: Gastroenterology

## 2018-08-22 VITALS — BP 132/86 | HR 64

## 2018-08-22 DIAGNOSIS — K625 Hemorrhage of anus and rectum: Secondary | ICD-10-CM | POA: Diagnosis not present

## 2018-08-22 DIAGNOSIS — K5732 Diverticulitis of large intestine without perforation or abscess without bleeding: Secondary | ICD-10-CM | POA: Diagnosis not present

## 2018-08-22 NOTE — Patient Instructions (Addendum)
You will be set up for a colonoscopy for minor rectal bleeding, recent diveriticulitis. We will contact you for a early morning Friday appointment in March  Please start taking citrucel (orange flavored) powder fiber supplement.  This may cause some bloating at first but that usually goes away. Begin with a small spoonful and work your way up to a large, heaping spoonful daily over a week.  Stop protonix.  Thank you for entrusting me with your care and choosing [facility].  Dr Ardis Hughs

## 2018-08-22 NOTE — Progress Notes (Signed)
Review of pertinent gastrointestinal problems: 1.  Colonoscopy with Dr. Verl Blalock November 2010, it was normal.  He recommended repeat colonoscopy at 10-year interval. 2.  EGD 2018 Dr. Ardis Hughs Duodenal ulcer, small clean based, likely NSAID related.  Biopsies of the stomach showed no H. pylori.  Biopsies of the duodenum showed no sign of cancer.     HPI: This is a very pleasant 58 year old man who was referred to me by [name], MD  to evaluate intermittent rectal bleeding, recent diverticulitis.    4 a year or 2 he has had intermittent mild rectal bleeding.  It sounds anorectal in origin as it is a small amount of bright red blood without any anal discomfort.  Around that time he does feel some hemorrhoidal type bumps that his anus.  He says this usually happens when he has more than just 1 or 2 bowel movements in a day.  That does happen periodically for him.  They are not diarrhea stools he does goes more often sometimes.  This past October he had significant lower mid abdomen pain that recurred several times throughout the day and over the course of the weekend.  No fevers or chills.  He felt very gassy.  He eventually sought medical attention and was found to have sigmoid diverticulitis on the CT scan.  He took Cipro Flagyl for about 7-day course and his pains resolved quickly and he has not been bothered by them since.  He had never had diverticulitis before.   Old Data Reviewed: Blood work December 2019 show normal CBC, normal complete metabolic profile.  He had borderline low iron studies and was recommended to be on an iron pill once a day.  CT scan abdomen pelvis with IV and oral contrast October 2019 impression "moderate sigmoid diverticulitis with microperforation.  No evidence of abscess or free air"  Review of systems: Pertinent positive and negative review of systems were noted in the above HPI section. All other review negative.   Past Medical History:  Diagnosis  Date  . Anemia   . Arthritis   . Diverticulitis   . GERD (gastroesophageal reflux disease)   . Hyperlipidemia   . Other testicular hypofunction   . Paroxysmal atrial fibrillation [facility])     Past Surgical History:  Procedure Laterality Date  . CHONDROPLASTY Left 09/15/2016   Procedure: CHONDROPLASTY;  Surgeon: Ninetta Lights, MD;  Location: [place];  Service: Orthopedics;  Laterality: Left;  . COLONOSCOPY    . KNEE ARTHROSCOPY W/ MENISCAL REPAIR  2009   right  . KNEE ARTHROSCOPY WITH MEDIAL MENISECTOMY Left 09/15/2016   Procedure: LEFT KNEE ARTHROSCOPY CHONDROPLASTY  WITH MEDIAL MENISECTOMY;  Surgeon: Ninetta Lights, MD;  Location: [place];  Service: Orthopedics;  Laterality: Left;  . PROXIMAL INTERPHALANGEAL FUSION (PIP) Left 05/14/2013   Procedure: RECONSTRUCTION BOUTONNIERE (LEFT SMALL FINGER) PINNING PROXIMAL INTERPHALANGEAL FUSION (PIP);  Surgeon: Wynonia Sours, MD;  Location: [place];  Service: Orthopedics;  Laterality: Left;    Current Outpatient Medications  Medication Sig Dispense Refill  . Ascorbic Acid (VITAMIN C) 1000 MG tablet Take 500 mg by mouth daily.     Marland Kitchen aspirin 81 MG tablet Take 81 mg by mouth daily.    . CHELATED ZINC PO Take 30 mg by mouth 2 (two) times daily.    . cholecalciferol (VITAMIN D) 1000 UNITS tablet Take 5,000 Units by mouth daily.     Marland Kitchen CINNAMON PO Take 1,000 mg by  mouth. Take 1000mg  by mouth BID    . Cyanocobalamin (VITAMIN B-12 PO) Take 3,000 mcg by mouth.    . fish oil-omega-3 fatty acids 1000 MG capsule Take 2 g by mouth daily.    . flecainide (TAMBOCOR) 100 MG tablet Take 3 tablets by mouth as needed for onset of afib 9 tablet 1  . GARLIC PO Take by mouth 2 (two) times daily.    . pantoprazole (PROTONIX) 40 MG tablet Take 40 mg by mouth every other day. Take one tablet by mouth three days a week    . ranitidine (ZANTAC) 300 MG tablet TAKE 1 TABLET BY MOUTH ONCE TO TWICE DAILY AS NEEDED FOR  INDIGESTION 180 tablet 1  . rosuvastatin (CRESTOR) 40 MG tablet Take 1/2 to 1 tablet daily or as directed for Cholesterol 90 tablet 1  . TURMERIC PO Take by mouth.     No current facility-administered medications for this visit.     Allergies as of 08/22/2018  . (No Known Allergies)    Family History  Problem Relation Age of Onset  . Transient ischemic attack Mother   . Stroke Father   . Heart disease Father   . Hypertension Father   . Cancer Maternal Grandmother        colon   . Colon cancer Maternal Grandmother 89  . Stomach cancer Neg Hx   . Rectal cancer Neg Hx   . Esophageal cancer Neg Hx   . Liver cancer Neg Hx     Social History   Socioeconomic History  . Marital status: Married    Spouse name: Not on file  . Number of children: 2  . Years of education: Not on file  . Highest education level: Not on file  Occupational History  . Occupation: Development worker, international aid: [employer]  Social Needs  . Financial resource strain: Not on file  . Food insecurity:    Worry: Not on file    Inability: Not on file  . Transportation needs:    Medical: Not on file    Non-medical: Not on file  Tobacco Use  . Smoking status: Never Smoker  . Smokeless tobacco: Never Used  Substance and Sexual Activity  . Alcohol use: No  . Drug use: No  . Sexual activity: Not on file  Lifestyle  . Physical activity:    Days per week: Not on file    Minutes per session: Not on file  . Stress: Not on file  Relationships  . Social connections:    Talks on phone: Not on file    Gets together: Not on file    Attends religious service: Not on file    Active member of club or organization: Not on file    Attends meetings of clubs or organizations: Not on file    Relationship status: Not on file  . Intimate partner violence:    Fear of current or ex partner: Not on file    Emotionally abused: Not on file    Physically abused: Not on file    Forced sexual activity: Not on file  Other Topics  Concern  . Not on file  Social History Narrative   Works as a Insurance account manager for [employer] Exam: BP 132/86   Pulse 64  Constitutional: generally well-appearing Psychiatric: alert and oriented x3 Eyes: extraocular movements intact Mouth: oral pharynx moist, no lesions Neck: supple no lymphadenopathy Cardiovascular: heart regular rate and rhythm Lungs: clear  to auscultation bilaterally Abdomen: soft, nontender, nondistended, no obvious ascites, no peritoneal signs, normal bowel sounds Extremities: no lower extremity edema bilaterally Skin: no lesions on visible extremities Rectal exam deferred for upcoming colonoscopy  Assessment and plan: 58 y.o. male with minor intermittent rectal bleeding, recent diverticulitis  First I do think his minor intermittent rectal bleeding is likely benign, anorectal in origin.  Probably hemorrhoids which he feels externally at times.  I advised him to try some fiber supplement on a daily basis to see if we can keep him from having more than 2 or 3 bowel movements a day.  Hoping to bulk the stools up in lead to more complete evacuation with 1 sitting.  That would lead to less wiping and less anal trauma.  I also advised colonoscopy for colon cancer screening and due to the fact that he had diverticulitis recently.  He understands that although it is uncommon sometimes a small colon cancer can mimic acute diverticulitis.  I see no reason for any further blood tests or imaging studies at this time.  He takes proton pump inhibitor about 3 times a week.  This is so he does not get an ulcer again.  I advised him that he can probably stop it.  His ulcer previously was clearly NSAID, meloxicam related and he no longer takes those.  He does not get any GERD symptoms  Please see the "Patient Instructions" section for addition details about the plan.   Owens Loffler, MD [facility] 08/22/2018, 3:29 PM    Cc: [name], MD

## 2018-09-24 ENCOUNTER — Ambulatory Visit (AMBULATORY_SURGERY_CENTER): Payer: Self-pay | Admitting: *Deleted

## 2018-09-24 VITALS — Ht 72.0 in | Wt 197.0 lb

## 2018-09-24 DIAGNOSIS — K625 Hemorrhage of anus and rectum: Secondary | ICD-10-CM

## 2018-09-24 DIAGNOSIS — K5732 Diverticulitis of large intestine without perforation or abscess without bleeding: Secondary | ICD-10-CM

## 2018-09-24 MED ORDER — NA SULFATE-K SULFATE-MG SULF 17.5-3.13-1.6 GM/177ML PO SOLN: ORAL | 0 refills | Status: DC

## 2018-09-24 NOTE — Progress Notes (Signed)
Patient denies any allergies to eggs or soy. Patient denies any problems with anesthesia/sedation. Patient denies any oxygen use at home. Patient denies taking any diet/weight loss medications or blood thinners. EMMI education offered, pt declined. Patient requested different prep than the [facility]. I explained the cost. He was ok with cost. Suprep coupon given to pt.

## 2018-09-28 ENCOUNTER — Encounter: Payer: Self-pay | Admitting: Gastroenterology

## 2018-10-05 ENCOUNTER — Encounter: Payer: 59 | Admitting: Gastroenterology

## 2018-10-11 ENCOUNTER — Telehealth: Payer: Self-pay

## 2018-10-11 NOTE — Telephone Encounter (Signed)
Covid-19 travel screening questions  Have you traveled in the last 14 days? No. If yes where?  Do you now or have you had a fever in the last 14 days? No.  Do you have any respiratory symptoms of shortness of breath or cough now or in the last 14 days? No.  Do you have a medical history of Congestive Heart Failure?  Do you have a medical history of lung disease?  Do you have any family members or close contacts with diagnosed or suspected Covid-19? No.   Patient understands and agrees that the Care partner will stay in their car in the parking lot during the procedure and will be called when the patient is ready for discharge.

## 2018-10-12 ENCOUNTER — Ambulatory Visit (AMBULATORY_SURGERY_CENTER): Payer: 59 | Admitting: Gastroenterology

## 2018-10-12 ENCOUNTER — Encounter: Payer: Self-pay | Admitting: Gastroenterology

## 2018-10-12 ENCOUNTER — Other Ambulatory Visit: Payer: Self-pay

## 2018-10-12 VITALS — BP 120/81 | HR 64 | Temp 96.8°F | Resp 14 | Ht 72.0 in | Wt 197.0 lb

## 2018-10-12 DIAGNOSIS — K625 Hemorrhage of anus and rectum: Secondary | ICD-10-CM

## 2018-10-12 DIAGNOSIS — D123 Benign neoplasm of transverse colon: Secondary | ICD-10-CM

## 2018-10-12 DIAGNOSIS — K649 Unspecified hemorrhoids: Secondary | ICD-10-CM | POA: Diagnosis not present

## 2018-10-12 DIAGNOSIS — K5730 Diverticulosis of large intestine without perforation or abscess without bleeding: Secondary | ICD-10-CM | POA: Diagnosis not present

## 2018-10-12 MED ORDER — SODIUM CHLORIDE 0.9 % IV SOLN: 500.0000 mL | Freq: Once | INTRAVENOUS | Status: DC

## 2018-10-12 NOTE — Progress Notes (Signed)
Report given to PACU, vss 

## 2018-10-12 NOTE — Progress Notes (Signed)
Called to room to assist during endoscopic procedure.  Patient ID and intended procedure confirmed with present staff. Received instructions for my participation in the procedure from the performing physician.  

## 2018-10-12 NOTE — Op Note (Signed)
[place] Patient Name: Jack Russell Procedure Date: 10/12/2018 8:41 AM MRN: [id number] Endoscopist: Milus Banister , MD Age: 58 Referring MD:  Date of Birth: 1961-05-09 Gender: Male Account #: [id number] Procedure:                Colonoscopy Indications:              Follow-up of diverticulitis Medicines:                Monitored Anesthesia Care Procedure:                Pre-Anesthesia Assessment:                           - Prior to the procedure, a History and Physical                            was performed, and patient medications and                            allergies were reviewed. The patient's tolerance of                            previous anesthesia was also reviewed. The risks                            and benefits of the procedure and the sedation                            options and risks were discussed with the patient.                            All questions were answered, and informed consent                            was obtained. Prior Anticoagulants: The patient has                            taken no previous anticoagulant or antiplatelet                            agents. ASA Grade Assessment: II - A patient with                            mild systemic disease. After reviewing the risks                            and benefits, the patient was deemed in                            satisfactory condition to undergo the procedure.                           After obtaining informed consent, the colonoscope  was passed under direct vision. Throughout the                            procedure, the patient's blood pressure, pulse, and                            oxygen saturations were monitored continuously. The                            Colonoscope was introduced through the anus and                            advanced to the the cecum, identified by                            appendiceal orifice and ileocecal valve. The                             colonoscopy was performed without difficulty. The                            patient tolerated the procedure well. The quality                            of the bowel preparation was good. Scope In: 8:48:08 AM Scope Out: 9:00:25 AM Scope Withdrawal Time: 0 hours 10 minutes 21 seconds  Total Procedure Duration: 0 hours 12 minutes 17 seconds  Findings:                 A 3 mm polyp was found in the transverse colon. The                            polyp was sessile. The polyp was removed with a                            cold snare. Resection and retrieval were complete.                           A few small-mouthed diverticula were found in the                            left colon (associated with mucosal edema, slight                            luminal narrowing)                           Internal hemorrhoids were found. The hemorrhoids                            were small.                           The exam was otherwise without abnormality on  direct and retroflexion views. Complications:            No immediate complications. Estimated blood loss:                            None. Estimated Blood Loss:     Estimated blood loss: none. Impression:               - One 3 mm polyp in the transverse colon, removed                            with a cold snare. Resected and retrieved.                           - Diverticulosis in the left colon.                           - Internal hemorrhoids.                           - The examination was otherwise normal on direct                            and retroflexion views. Recommendation:           - Patient has a contact number available for                            emergencies. The signs and symptoms of potential                            delayed complications were discussed with the                            patient. Return to normal activities tomorrow.                             Written discharge instructions were provided to the                            patient.                           - Resume previous diet.                           - Continue present medications. Stay on daily                            fiber, it seems to be helping.                           You will receive a letter within 2-3 weeks with the                            pathology results and my final recommendations.  If the polyp(s) is proven to be 'pre-cancerous' on                            pathology, you will need repeat colonoscopy in 7                            years. If the polyp(s) is NOT 'precancerous' on                            pathology then you should repeat colon cancer                            screening in 10 years with colonoscopy without need                            for colon cancer screening by any method prior to                            then (including stool testing). Milus Banister, MD 10/12/2018 9:04:12 AM This report has been signed electronically.

## 2018-10-12 NOTE — Patient Instructions (Signed)
YOU HAD AN ENDOSCOPIC PROCEDURE TODAY AT [facility]:   Refer to the procedure report that was given to you for any specific questions about what was found during the examination.  If the procedure report does not answer your questions, please call your gastroenterologist to clarify.  If you requested that your care partner not be given the details of your procedure findings, then the procedure report has been included in a sealed envelope for you to review at your convenience later.  YOU SHOULD EXPECT: Some feelings of bloating in the abdomen. Passage of more gas than usual.  Walking can help get rid of the air that was put into your GI tract during the procedure and reduce the bloating. If you had a lower endoscopy (such as a colonoscopy or flexible sigmoidoscopy) you may notice spotting of blood in your stool or on the toilet paper. If you underwent a bowel prep for your procedure, you may not have a normal bowel movement for a few days.  Please Note:  You might notice some irritation and congestion in your nose or some drainage.  This is from the oxygen used during your procedure.  There is no need for concern and it should clear up in a day or so.  SYMPTOMS TO REPORT IMMEDIATELY:   Following lower endoscopy (colonoscopy or flexible sigmoidoscopy):  Excessive amounts of blood in the stool  Significant tenderness or worsening of abdominal pains  Swelling of the abdomen that is new, acute  Fever of 100F or higher  Please see handouts given to you on Diverticulosis, Polyps and Hemorrhoids. Stay on daily fiber please.  For urgent or emergent issues, a gastroenterologist can be reached at any hour by calling [phone].   DIET:  We do recommend a small meal at first, but then you may proceed to your regular diet.  Drink plenty of fluids but you should avoid alcoholic beverages for 24 hours.  ACTIVITY:  You should plan to take it easy for the rest of today and you should NOT  DRIVE or use heavy machinery until tomorrow (because of the sedation medicines used during the test).    FOLLOW UP: Our staff will call the number listed on your records the next business day following your procedure to check on you and address any questions or concerns that you may have regarding the information given to you following your procedure. If we do not reach you, we will leave a message.  However, if you are feeling well and you are not experiencing any problems, there is no need to return our call.  We will assume that you have returned to your regular daily activities without incident.  If any biopsies were taken you will be contacted by phone or by letter within the next 1-3 weeks.  Please call us at [phone] if you have not heard about the biopsies in 3 weeks.    SIGNATURES/CONFIDENTIALITY: You and/or your care partner have signed paperwork which will be entered into your electronic medical record.  These signatures attest to the fact that that the information above on your After Visit Summary has been reviewed and is understood.  Full responsibility of the confidentiality of this discharge information lies with you and/or your care-partner.  Thank you for letting us take care of your healthcare needs today.

## 2018-10-12 NOTE — Progress Notes (Signed)
Pt's states no medical or surgical changes since previsit or office visit. 

## 2018-10-15 ENCOUNTER — Telehealth: Payer: Self-pay | Admitting: *Deleted

## 2018-10-15 NOTE — Telephone Encounter (Signed)
  Follow up Call-  Call back number 10/12/2018 10/11/2016  Post procedure Call Back phone  # [phone]  Permission to leave phone message Yes Yes  Some recent data might be hidden     Patient questions:  Do you have a fever, pain , or abdominal swelling? No. Pain Score  0 *  Have you tolerated food without any problems? Yes.    Have you been able to return to your normal activities? Yes.    Do you have any questions about your discharge instructions: Diet   No. Medications  No. Follow up visit  No.  Do you have questions or concerns about your Care? No.  Actions: * If pain score is 4 or above: No action needed, pain <4.

## 2018-10-17 ENCOUNTER — Encounter: Payer: Self-pay | Admitting: Gastroenterology

## 2018-11-17 ENCOUNTER — Other Ambulatory Visit: Payer: Self-pay | Admitting: Internal Medicine

## 2018-11-17 DIAGNOSIS — K219 Gastro-esophageal reflux disease without esophagitis: Secondary | ICD-10-CM

## 2018-11-17 MED ORDER — FAMOTIDINE 40 MG PO TABS: ORAL_TABLET | ORAL | 3 refills | Status: DC

## 2018-11-17 MED ORDER — PANTOPRAZOLE SODIUM 40 MG PO TBEC: DELAYED_RELEASE_TABLET | ORAL | 3 refills | Status: DC

## 2019-01-09 ENCOUNTER — Encounter: Payer: Self-pay | Admitting: Adult Health

## 2019-01-09 NOTE — Progress Notes (Signed)
FOLLOW UP  Assessment and Plan:   Paroxysmal a fib Rate controlled today; no issues in several years; flecainide PRN recurrence Continue with current medications for rate control, ASA Followed by Dr. Johnsie Cancel  Hypertension Well controlled with current medications  Monitor blood pressure at home; patient to call if consistently greater than 130/80 Continue DASH diet.   Reminder to go to the ER if any CP, SOB, nausea, dizziness, severe HA, changes vision/speech, left arm numbness and tingling and jaw pain.  Cholesterol Newly on rosuvastatin 20 mg and tolerating well  Continue low cholesterol diet and exercise.  Check lipid panel.   Other abnormal glucose Continue diet and exercise.  Perform daily foot/skin check, notify office of any concerning changes.  Check CMP/GFR  BMI 26 Long discussion about weight loss, diet, and exercise Recommended diet heavy in fruits and veggies and low in animal meats, cheeses, and dairy products, appropriate calorie intake Discussed ideal weight for height Will follow up in 3 months  Vitamin D Def At goal at last visit; continue supplementation for goal of 60-100 Defer Vit D level  GERD Well managed on current medications; doing well alternating PPI and H2i Discussed diet, avoiding triggers and other lifestyle changes  Tenosynovitis Advised rest, avoid repetitive motions, can get hand/finger brace for 4-6 weeks to remind to avoid using if needed, NSAID 1-2 weeks; mild intermittent symptoms; if worsning can refer to hand specialist for injection    Continue diet and meds as discussed. Further disposition pending results of labs. Discussed med's effects and SE's.   Over 30 minutes of exam, counseling, chart review, and critical decision making was performed.   Future Appointments  Date Time Provider [place]  07/31/2019  2:00 PM [name], MD [facility] None     ----------------------------------------------------------------------------------------------------------------------  HPI 58 y.o. male  presents for 6 month follow up on hypertension, cholesterol, glucose management, weight and vitamin D deficiency.  In Mar 2018,  He was found severely anemic and EGD (Dr Ardis Hughs) found gastrititis  & DU attributed to Mobic and patient was tx'd with Protonix/Carafate nad has done well since. He now alternates with famotidine and symptoms well controlled.   Patient has hx/o pAfib (CHADsVasc = 0) since 2012  and recently transitioned to flecainide PRN plan and is doing well without further known episodes since last episode in 06/2017 - resolved within 2 hours after taking flecainide.   He reports intermittently R 3rd digit becomes contracted and has to "force" to extend; none in past week. Not reproducible in office today.   BMI is Body mass index is 26.8 kg/m., he has been working on diet and exercise. He has set a goal of 185 lb by the end of this. He has started walking again. He estimates 48 fluid ounces of water daily.  Wt Readings from Last 3 Encounters:  01/10/19 197 lb 9.6 oz (89.6 kg)  10/12/18 197 lb (89.4 kg)  09/24/18 197 lb (89.4 kg)   His blood pressure has been controlled at home, today their BP is BP: 118/72  He does workout. He denies chest pain, shortness of breath, dizziness.   He is newly on cholesterol medication (rosuvastatin 20 mg daily) and denies myalgias. His cholesterol is not at goal. The cholesterol last visit was:   Lab Results  Component Value Date   CHOL 213 (H) 07/03/2018   HDL 43 07/03/2018   LDLCALC 136 (H) 07/03/2018   TRIG 198 (H) 07/03/2018   CHOLHDL 5.0 (H) 07/03/2018  He has been working on diet and exercise for glucose management, and denies foot ulcerations, increased appetite, nausea, paresthesia of the feet, polydipsia, polyuria, visual disturbances, vomiting and weight loss. Last A1C in the office was:   Lab Results  Component Value Date   HGBA1C 5.3 07/03/2018   Patient is on Vitamin D supplement   Lab Results  Component Value Date   VD25OH 42 07/03/2018     He has a history of testosterone deficiency and is on zinc supplement and feels doing well with this.  Lab Results  Component Value Date   TESTOSTERONE 396 07/03/2018   He reports history of BPH and has been on finasteride in the past with symptoms resolved and stopped; in the last year having 1 episode of nocturia, slow urine stream, difficulty emptying bladder.  Lab Results  Component Value Date   PSA 1.0 05/31/2017   PSA 1.0 05/03/2016   PSA 1.05 04/27/2015     Current Medications:  Current Outpatient Medications on File Prior to Visit  Medication Sig  . Ascorbic Acid (VITAMIN C) 1000 MG tablet Take 500 mg by mouth daily.   Marland Kitchen aspirin 81 MG tablet Take 81 mg by mouth daily.  . CHELATED ZINC PO Take 50 mg by mouth daily.  . cholecalciferol (VITAMIN D) 1000 UNITS tablet Take 5,000 Units by mouth daily.   Marland Kitchen CINNAMON PO Take 1,000 mg by mouth. Take 1000mg  by mouth BID  . Cyanocobalamin (VITAMIN B-12 PO) Take 3,000 mcg by mouth.  . famotidine (PEPCID) 40 MG tablet Take 1 tablet at bedtime for Acid Reflux  . fish oil-omega-3 fatty acids 1000 MG capsule Take 2 g by mouth daily.  . flecainide (TAMBOCOR) 100 MG tablet Take 3 tablets by mouth as needed for onset of afib  . GARLIC PO Take by mouth 2 (two) times daily.  . pantoprazole (PROTONIX) 40 MG tablet Take 1 tablet every morning for Acid indigestion & Reflux  . rosuvastatin (CRESTOR) 40 MG tablet Take 1/2 to 1 tablet daily or as directed for Cholesterol  . TURMERIC PO Take by mouth.   No current facility-administered medications on file prior to visit.      Allergies: No Known Allergies   Medical History:  Past Medical History:  Diagnosis Date  . Anemia   . Arthritis   . Diverticulitis   . GERD (gastroesophageal reflux disease)   . Hyperlipidemia   . Other  testicular hypofunction   . Paroxysmal atrial fibrillation (HCC)   . Sigmoid diverticulitis 05/21/2018   Family history- Reviewed and unchanged Social history- Reviewed and unchanged   Review of Systems:  Review of Systems  Constitutional: Negative for malaise/fatigue and weight loss.  HENT: Negative for hearing loss and tinnitus.   Eyes: Negative for blurred vision and double vision.  Respiratory: Negative for cough, shortness of breath and wheezing.   Cardiovascular: Negative for chest pain, palpitations, orthopnea, claudication and leg swelling.  Gastrointestinal: Negative for abdominal pain, blood in stool, constipation, diarrhea, heartburn, melena, nausea and vomiting.  Genitourinary: Negative.   Musculoskeletal: Negative for joint pain and myalgias.  Skin: Negative for rash.  Neurological: Negative for dizziness, tingling, sensory change, weakness and headaches.  Endo/Heme/Allergies: Negative for polydipsia.  Psychiatric/Behavioral: Negative.   All other systems reviewed and are negative.     Physical Exam: BP 118/72   Pulse 65   Temp (!) 97.3 F (36.3 C)   Ht 6' (1.829 m)   Wt 197 lb 9.6 oz (89.6 kg)  SpO2 97%   BMI 26.80 kg/m  Wt Readings from Last 3 Encounters:  01/10/19 197 lb 9.6 oz (89.6 kg)  10/12/18 197 lb (89.4 kg)  09/24/18 197 lb (89.4 kg)   General Appearance: Well nourished, in no apparent distress. Eyes: PERRLA, EOMs, conjunctiva no swelling or erythema Sinuses: No Frontal/maxillary tenderness ENT/Mouth: Ext aud canals clear, TMs without erythema, bulging. No erythema, swelling, or exudate on post pharynx.  Tonsils not swollen or erythematous. Hearing normal.  Neck: Supple, thyroid normal.  Respiratory: Respiratory effort normal, BS equal bilaterally without rales, rhonchi, wheezing or stridor.  Cardio: RRR with no MRGs. Brisk peripheral pulses without edema.  Abdomen: Soft, + BS.  Non tender, no guarding, rebound, hernias, masses. Lymphatics:  Non tender without lymphadenopathy.  Musculoskeletal: Full ROM, 5/5 strength, Normal gait Skin: Warm, dry without rashes, lesions, ecchymosis.  Neuro: Cranial nerves intact. No cerebellar symptoms.  Psych: Awake and oriented X 3, normal affect, Insight and Judgment appropriate.    Izora Ribas, NP 3:48 PM [facility]

## 2019-01-10 ENCOUNTER — Other Ambulatory Visit: Payer: Self-pay

## 2019-01-10 ENCOUNTER — Encounter: Payer: Self-pay | Admitting: Adult Health

## 2019-01-10 ENCOUNTER — Ambulatory Visit: Payer: 59 | Admitting: Adult Health

## 2019-01-10 VITALS — BP 118/72 | HR 65 | Temp 97.3°F | Ht 72.0 in | Wt 197.6 lb

## 2019-01-10 DIAGNOSIS — K219 Gastro-esophageal reflux disease without esophagitis: Secondary | ICD-10-CM | POA: Diagnosis not present

## 2019-01-10 DIAGNOSIS — I480 Paroxysmal atrial fibrillation: Secondary | ICD-10-CM

## 2019-01-10 DIAGNOSIS — E782 Mixed hyperlipidemia: Secondary | ICD-10-CM

## 2019-01-10 DIAGNOSIS — E349 Endocrine disorder, unspecified: Secondary | ICD-10-CM

## 2019-01-10 DIAGNOSIS — I10 Essential (primary) hypertension: Secondary | ICD-10-CM

## 2019-01-10 DIAGNOSIS — E559 Vitamin D deficiency, unspecified: Secondary | ICD-10-CM | POA: Diagnosis not present

## 2019-01-10 DIAGNOSIS — Z79899 Other long term (current) drug therapy: Secondary | ICD-10-CM

## 2019-01-10 DIAGNOSIS — Z6826 Body mass index (BMI) 26.0-26.9, adult: Secondary | ICD-10-CM

## 2019-01-10 DIAGNOSIS — R7309 Other abnormal glucose: Secondary | ICD-10-CM

## 2019-01-10 DIAGNOSIS — R399 Unspecified symptoms and signs involving the genitourinary system: Secondary | ICD-10-CM

## 2019-01-10 MED ORDER — FINASTERIDE 5 MG PO TABS: 5.0000 mg | ORAL_TABLET | Freq: Every day | ORAL | 1 refills | Status: DC

## 2019-01-10 NOTE — Patient Instructions (Addendum)
Goals    . DIET - INCREASE WATER INTAKE     65-80+ fluid ounces daily     . Exercise 150 min/wk Moderate Activity    . Weight (lb) < 185 lb (83.9 kg)         Try taking ibuprofen or aleve daily for 1-2 weeks and then as needed for finger contracture     Can do zinc 40-50 mg a day (cut back from 30 mg twice daily) Can try shake that is whey protein, almond milk, avocado oil, and spinach and strawberries in the morning  9 Ways to Naturally Increase Testosterone Levels  1.   Lose Weight If you're overweight, shedding the excess pounds may increase your testosterone levels, according to research presented at the Endocrine Society's 2012 meeting. Overweight men are more likely to have low testosterone levels to begin with, so this is an important trick to increase your body's testosterone production when you need it most.  2.   High-Intensity Exercise like Peak Fitness  Short intense exercise has a proven positive effect on increasing testosterone levels and preventing its decline. That's unlike aerobics or prolonged moderate exercise, which have shown to have negative or no effect on testosterone levels. Having a whey protein meal after exercise can further enhance the satiety/testosterone-boosting impact (hunger hormones cause the opposite effect on your testosterone and libido). Here's a summary of what a typical high-intensity [employer]itness routine might look like: " Warm up for three minutes  " Exercise as hard and fast as you can for 30 seconds. You should feel like you couldn't possibly go on another few seconds  " Recover at a slow to moderate pace for 90 seconds  " Repeat the high intensity exercise and recovery 7 more times .  3.   Consume Plenty of Zinc The mineral zinc is important for testosterone production, and supplementing your diet for as little as six weeks has been shown to cause a marked improvement in testosterone among men with low levels.1 Likewise, research has  shown that restricting dietary sources of zinc leads to a significant decrease in testosterone, while zinc supplementation increases it2 -- and even protects men from exercised-induced reductions in testosterone levels.3 It's estimated that up to 78 percent of adults over the age of 60 may have lower than recommended zinc intakes; even when dietary supplements were added in, an estimated 20-25 percent of older adults still had inadequate zinc intakes, according to a [employer] and Nutrition Examination Survey.4 Your diet is the best source of zinc; along with protein-rich foods like meats and fish, other good dietary sources of zinc include raw milk, raw cheese, beans, and yogurt or kefir made from raw milk. It can be difficult to obtain enough dietary zinc if you're a vegetarian, and also for meat-eaters as well, largely because of conventional farming methods that rely heavily on chemical fertilizers and pesticides. These chemicals deplete the soil of nutrients ... nutrients like zinc that must be absorbed by plants in order to be passed on to you. In many cases, you may further deplete the nutrients in your food by the way you prepare it. For most food, cooking it will drastically reduce its levels of nutrients like zinc ... particularly over-cooking, which many people do. If you decide to use a zinc supplement, stick to a dosage of less than 40 mg a day, as this is the recommended adult upper limit. Taking too much zinc can interfere with your body's ability to absorb  other minerals, especially copper, and may cause nausea as a side effect.  4.   Strength Training In addition to [employer] Fitness, strength training is also known to boost testosterone levels, provided you are doing so intensely enough. When strength training to boost testosterone, you'll want to increase the weight and lower your number of reps, and then focus on exercises that work a large number of muscles, such as dead lifts or squats.   You can "turbo-charge" your weight training by going slower. By slowing down your movement, you're actually turning it into a high-intensity exercise. Super Slow movement allows your muscle, at the microscopic level, to access the maximum number of cross-bridges between the protein filaments that produce movement in the muscle.   5.   Optimize Your Vitamin D Levels Vitamin D, a steroid hormone, is essential for the healthy development of the nucleus of the sperm cell, and helps maintain semen quality and sperm count. Vitamin D also increases levels of testosterone, which may boost libido. In one study, overweight men who were given vitamin D supplements had a significant increase in testosterone levels after one year.5   6.   Reduce Stress When you're under a lot of stress, your body releases high levels of the stress hormone cortisol. This hormone actually blocks the effects of testosterone,6 presumably because, from a biological standpoint, testosterone-associated behaviors (mating, competing, aggression) may have lowered your chances of survival in an emergency (hence, the "fight or flight" response is dominant, courtesy of cortisol).  7.   Limit or Eliminate Sugar from Your Diet Testosterone levels decrease after you eat sugar, which is likely because the sugar leads to a high insulin level, another factor leading to low testosterone.7 Based on USDA estimates, the average American consumes 12 teaspoons of sugar a day, which equates to about TWO TONS of sugar during a lifetime.  8.   Eat Healthy Fats By healthy, this means not only mon- and polyunsaturated fats, like that found in avocadoes and nuts, but also saturated, as these are essential for building testosterone. Research shows that a diet with less than 40 percent of energy as fat (and that mainly from animal sources, i.e. saturated) lead to a decrease in testosterone levels.8 My personal diet is about 60-70 percent healthy fat, and other  experts agree that the ideal diet includes somewhere between 50-70 percent fat.  It's important to understand that your body requires saturated fats from animal and vegetable sources (such as meat, dairy, certain oils, and tropical plants like coconut) for optimal functioning, and if you neglect this important food group in favor of sugar, grains and other starchy carbs, your health and weight are almost guaranteed to suffer. Examples of healthy fats you can eat more of to give your testosterone levels a boost include: Olives and Olive oil  Coconuts and coconut oil Butter made from raw grass-fed organic milk Raw nuts, such as, almonds or pecans Organic pastured egg yolks Avocados Grass-fed meats Palm oil Unheated organic nut oils   9.   Boost Your Intake of Branch Chain Amino Acids (BCAA) from Foods Like [place] suggests that BCAAs result in higher testosterone levels, particularly when taken along with resistance training.9 While BCAAs are available in supplement form, you'll find the highest concentrations of BCAAs like leucine in dairy products - especially quality cheeses and whey protein. Even when getting leucine from your natural food supply, it's often wasted or used as a building block instead of an anabolic agent. So  to create the correct anabolic environment, you need to boost leucine consumption way beyond mere maintenance levels. That said, keep in mind that using leucine as a free form amino acid can be highly counterproductive as when free form amino acids are artificially administrated, they rapidly enter your circulation while disrupting insulin function, and impairing your body's glycemic control. Food-based leucine is really the ideal form that can benefit your muscles without side effects.       Trigger Finger  Trigger finger (stenosing tenosynovitis) is a condition that causes a finger to get stuck in a bent position. Each finger has a tough, cord-like tissue that  connects muscle to bone (tendon), and each tendon is surrounded by a tunnel of tissue (tendon sheath). To move your finger, your tendon needs to slide freely through the sheath. Trigger finger happens when the tendon or the sheath thickens, making it difficult to move your finger. Trigger finger can affect any finger or a thumb. It may affect more than one finger. Mild cases may clear up with rest and medicine. Severe cases require more treatment. What are the causes? Trigger finger is caused by a thickened finger tendon or tendon sheath. The cause of this thickening is not known. What increases the risk? The following factors may make you more likely to develop this condition:  Doing activities that require a strong grip.  Having rheumatoid arthritis, gout, or diabetes.  Being 54-109 years old.  Being a woman. What are the signs or symptoms? Symptoms of this condition include:  Pain when bending or straightening your finger.  Tenderness or swelling where your finger attaches to the palm of your hand.  A lump in the palm of your hand or on the inside of your finger.  Hearing a popping sound when you try to straighten your finger.  Feeling a popping, catching, or locking sensation when you try to straighten your finger.  Being unable to straighten your finger. How is this diagnosed? This condition is diagnosed based on your symptoms and a physical exam. How is this treated? This condition may be treated by:  Resting your finger and avoiding activities that make symptoms worse.  Wearing a finger splint to keep your finger in a slightly bent position.  Taking NSAIDs to relieve pain and swelling.  Injecting medicine (steroids) into the tendon sheath to reduce swelling and irritation. Injections may need to be repeated.  Having surgery to open the tendon sheath. This may be done if other treatments do not work and you cannot straighten your finger. You may need physical therapy  after surgery. Follow these instructions at home:   Use moist heat to help reduce pain and swelling as told by your health care provider.  Rest your finger and avoid activities that make pain worse. Return to normal activities as told by your health care provider.  If you have a splint, wear it as told by your health care provider.  Take over-the-counter and prescription medicines only as told by your health care provider.  Keep all follow-up visits as told by your health care provider. This is important. Contact a health care provider if:  Your symptoms are not improving with home care. Summary  Trigger finger (stenosing tenosynovitis) causes your finger to get stuck in a bent position, and it can make it difficult and painful to straighten your finger.  This condition develops when a finger tendon or tendon sheath thickens.  Treatment starts with resting, wearing a splint, and taking NSAIDs.  In severe cases, surgery to open the tendon sheath may be needed. This information is not intended to replace advice given to you by your health care provider. Make sure you discuss any questions you have with your health care provider. Document Released: 04/30/2004 Document Revised: 06/21/2016 Document Reviewed: 06/21/2016 Elsevier Interactive Patient Education  2019 [employer].

## 2019-01-11 LAB — COMPLETE METABOLIC PANEL WITH GFR
AG Ratio: 1.8 (calc) (ref 1.0–2.5)
ALT: 20 U/L (ref 9–46)
AST: 17 U/L (ref 10–35)
Albumin: 4.4 g/dL (ref 3.6–5.1)
Alkaline phosphatase (APISO): 50 U/L (ref 35–144)
BUN: 19 mg/dL (ref 7–25)
CO2: 27 mmol/L (ref 20–32)
Calcium: 9.2 mg/dL (ref 8.6–10.3)
Chloride: 105 mmol/L (ref 98–110)
Creat: 0.97 mg/dL (ref 0.70–1.33)
GFR, Est African American: 99 mL/min/{1.73_m2} (ref >=60)
GFR, Est Non African American: 85 mL/min/{1.73_m2} (ref >=60)
Globulin: 2.5 g/dL (calc) (ref 1.9–3.7)
Glucose, Bld: 96 mg/dL (ref 65–99)
Potassium: 3.9 mmol/L (ref 3.5–5.3)
Sodium: 139 mmol/L (ref 135–146)
Total Bilirubin: 0.5 mg/dL (ref 0.2–1.2)
Total Protein: 6.9 g/dL (ref 6.1–8.1)

## 2019-01-11 LAB — CBC WITH DIFFERENTIAL/PLATELET
Absolute Monocytes: 386 cells/uL (ref 200–950)
Basophils Absolute: 67 cells/uL (ref 0–200)
Basophils Relative: 1.2 %
Eosinophils Absolute: 308 cells/uL (ref 15–500)
Eosinophils Relative: 5.5 %
HCT: 43 % (ref 38.5–50.0)
Hemoglobin: 14.6 g/dL (ref 13.2–17.1)
Lymphs Abs: 1574 cells/uL (ref 850–3900)
MCH: 29.9 pg (ref 27.0–33.0)
MCHC: 34 g/dL (ref 32.0–36.0)
MCV: 87.9 fL (ref 80.0–100.0)
MPV: 10.9 fL (ref 7.5–12.5)
Monocytes Relative: 6.9 %
Neutro Abs: 3265 cells/uL (ref 1500–7800)
Neutrophils Relative %: 58.3 %
Platelets: 240 10*3/uL (ref 140–400)
RBC: 4.89 10*6/uL (ref 4.20–5.80)
RDW: 13 % (ref 11.0–15.0)
Total Lymphocyte: 28.1 %
WBC: 5.6 10*3/uL (ref 3.8–10.8)

## 2019-01-11 LAB — LIPID PANEL
Cholesterol: 134 mg/dL (ref <200)
HDL: 42 mg/dL (ref >=40)
LDL Cholesterol (Calc): 67 mg/dL (calc)
Non-HDL Cholesterol (Calc): 92 mg/dL (calc) (ref <130)
Total CHOL/HDL Ratio: 3.2 (calc) (ref <5.0)
Triglycerides: 167 mg/dL — ABNORMAL HIGH (ref <150)

## 2019-01-11 LAB — TSH: TSH: 2.98 mIU/L (ref 0.40–4.50)

## 2019-01-11 LAB — MAGNESIUM: Magnesium: 2 mg/dL (ref 1.5–2.5)

## 2019-03-30 ENCOUNTER — Other Ambulatory Visit: Payer: Self-pay | Admitting: Internal Medicine

## 2019-03-30 DIAGNOSIS — E782 Mixed hyperlipidemia: Secondary | ICD-10-CM

## 2019-07-03 NOTE — Progress Notes (Signed)
Subjective:    Patient ID: Jack Russell, male    DOB: [birth year], 58 y.o.   MRN: [id number]  HPI          This very nice 58 yo MWM with hx/o HTN who reports home BP's have been Normal and denies any c/o. He presents also with concerns re:  sore lump at the base of the Rt posterior neck, # 4 scaly areas of the Lt forearm,  # 1 scaly area of the Lt temple, a filamentatous wart at the apex of the Left eyebrow, and  # 2 irritated seborrheic keratoses of the Lt frontal scalp line and also of the Rt mid sideburn.   Medication Sig  . Ascorbic Acid (VITAMIN C) 1000 MG tablet Take 500 mg by mouth daily.   Marland Kitchen aspirin 81 MG tablet Take 81 mg by mouth daily.  . CHELATED ZINC PO Take 50 mg by mouth daily.  . cholecalciferol (VITAMIN D) 1000 UNITS tablet Take 5,000 Units by mouth daily.   Marland Kitchen CINNAMON PO Take 1,000 mg by mouth. Take 1000mg  by mouth BID  . Cyanocobalamin (VITAMIN B-12 PO) Take 3,000 mcg by mouth.  . famotidine (PEPCID) 40 MG tablet Take 1 tablet at bedtime for Acid Reflux  . finasteride (PROSCAR) 5 MG tablet Take 1 tablet (5 mg total) by mouth daily.  . fish oil-omega-3 fatty acids 1000 MG capsule Take 2 g by mouth daily.  . flecainide (TAMBOCOR) 100 MG tablet Take 3 tablets by mouth as needed for onset of afib  . GARLIC PO Take by mouth 2 (two) times daily.  . pantoprazole (PROTONIX) 40 MG tablet Take 1 tablet every morning for Acid indigestion & Reflux  . rosuvastatin (CRESTOR) 40 MG tablet Take  1 tablet daily for Cholesterol  . TURMERIC PO Take by mouth.   No Known Allergies   Past Medical History:  Diagnosis Date  . Anemia   . Arthritis   . Diverticulitis   . GERD (gastroesophageal reflux disease)   . Hyperlipidemia   . Other testicular hypofunction   . Paroxysmal atrial fibrillation (HCC)   . Sigmoid diverticulitis 05/21/2018   Review of Systems    10 point systems review negative except as above.    Objective:   Physical Exam  BP 140/84   Pulse 64   Temp 97.8  F (36.6 C)   Resp 16   Ht 6\' 1"  (1.854 m)   Wt 194 lb 6.4 oz (88.2 kg)   BMI 25.65 kg/m   HEENT - WNL. Neck - supple.  Chest - Clear Cor -RRR w/o sig M MS- FROM w/o deformities.  Gait Nl. Neuro -  Nl w/o focal abnormalities. Skin  -  (1) 1.5 cm x 2.0 cm sub-cut cystic mass ant posterior base of Rt neck;   (2) # 4 scaly areas of the Lt forearm  and # 1 scaly area of the Lt temple all about 8-12 mm diameter;   (3) a filamentous 5 mm x 5 mm raised wart at the apex of the Left eyebrow;    (4) # 2 irritated seborrheic keratoses of the Lt frontal scalp line and also of the Rt mid sideburn  Procedures    (1)  (CPT J4310842)   After informed consent and aseptic prep with alcohol  to the lesion, then local anesthesia with 1.0 ml Marcaine 0.5%. Then a 2 cm transverse incision was made with a #10 scalpel to the cystic structure which appeared to be  a sebaceous cyst that was sharply dissected free. Then the wound cavity was collapsed with # 2 vertical mattress sutures with Nylon 3-0 and the wound edges were aligned and closed with # 4 running sutures. The wound was covered with #2 2 x 2 " gauge and a $" x 6 " Tegaderm.   (2)  (CPT 17000 & 17003 x 4)   The #5 areas of suspected actinic keratoses of the left temple & forearm were treated by cryosurgery with Liquid Nitrogen by triple freeze-thaw technique.  (3)   (CPT 17003 x 1)   The filamentous wart over the Lt eyebrow was prepped as in #1 above and anesthetized with Marcaine 0.5 ml. Then the lesion was hyfrecated & debrided to underlying healthy appearing tissue. Sterile band aid was applied.   (4)  (CPT 17003 x 2) The 2 irritated seborrheic keratoses  measuring about 12 mm x 14 mm were also treated with Liquid Nitrogen by triple  Freeze / thaw technique                                                                                                                                                                                                                                                                               Patient was instructed in p/o wound care & advised to return in 11 days for suture removal.  Assessment & Plan:    1. Essential hypertension    2. Inflamed sebaceous cyst  ( CPT - 11422)   3. Actinic keratoses  (CPT 17000 & 17003 x 4)  4. Verruca vulgaris  (CPT 17003 x1 )   5. Seborrheic keratoses, inflamed  (CPT - 17003 x 2)

## 2019-07-04 ENCOUNTER — Other Ambulatory Visit: Payer: Self-pay

## 2019-07-04 ENCOUNTER — Ambulatory Visit: Payer: 59 | Admitting: Internal Medicine

## 2019-07-04 ENCOUNTER — Encounter: Payer: Self-pay | Admitting: Internal Medicine

## 2019-07-04 VITALS — BP 140/84 | HR 64 | Temp 97.8°F | Resp 16 | Ht 73.0 in | Wt 194.4 lb

## 2019-07-04 DIAGNOSIS — I10 Essential (primary) hypertension: Secondary | ICD-10-CM | POA: Diagnosis not present

## 2019-07-04 DIAGNOSIS — B078 Other viral warts: Secondary | ICD-10-CM

## 2019-07-04 DIAGNOSIS — L820 Inflamed seborrheic keratosis: Secondary | ICD-10-CM

## 2019-07-04 DIAGNOSIS — L570 Actinic keratosis: Secondary | ICD-10-CM

## 2019-07-04 DIAGNOSIS — L723 Sebaceous cyst: Secondary | ICD-10-CM | POA: Diagnosis not present

## 2019-07-04 DIAGNOSIS — B079 Viral wart, unspecified: Secondary | ICD-10-CM

## 2019-07-09 ENCOUNTER — Other Ambulatory Visit: Payer: Self-pay | Admitting: Adult Health

## 2019-07-14 NOTE — Progress Notes (Signed)
   Subjective:    Patient ID: Jack Russell, male    DOB: 05-13-1961, 58 y.o.   MRN: [id number]  HPI     Patient is a very nice 58 yo MWM with hx/o HTN. HT systems review is negative.      Patient returns s/p excision of as large 1.5 x 2.0 Sebaceous cyst of the Rt neck on 07/04/2019.      Patient also presents with concerns re: a new skin lesion that he has discovered of his left neck.   Review of Systems   10 point systems review negative except as above.    Objective:   Physical Exam  BP 116/82   Pulse 76   Temp (!) 97 F (36.1 C)   Resp 16   Ht 6\' 1"  (1.854 m)   Wt 195 lb 12.8 oz (88.8 kg)   BMI 25.83 kg/m      Wound site Rt neck - clean & healed with good healing ridge  & w/o sign of infection. Sutures removed & antibiotic ung & Sterile bandage applied.     On the left lower posterior neck there is a 5 mm pink raised fleshy lesion    Procedure (CPT- 11305)     After informed consent and aseptic prep with alcohol and local anesthesia with 1 ml of Marcaine 0.5%, with a #10 scalpel the lesion was excised by excisional shave technique & sent for path analysis. The wound base was deeply hyfrecated to fulgurate any remnant lesion and sterile 2" x 3". Then Antibiotic ung &  Tegaderm applied.    Assessment & Plan:   1. Essential hypertension  2. Neoplasm of uncertain behavior of skin of neck  - excised & sent for path to r/o BCE

## 2019-07-15 ENCOUNTER — Ambulatory Visit: Payer: 59 | Admitting: Internal Medicine

## 2019-07-15 ENCOUNTER — Other Ambulatory Visit: Payer: Self-pay | Admitting: Internal Medicine

## 2019-07-15 ENCOUNTER — Other Ambulatory Visit: Payer: Self-pay

## 2019-07-15 ENCOUNTER — Encounter: Payer: Self-pay | Admitting: Internal Medicine

## 2019-07-15 VITALS — BP 116/82 | HR 76 | Temp 97.0°F | Resp 16 | Ht 73.0 in | Wt 195.8 lb

## 2019-07-15 DIAGNOSIS — D485 Neoplasm of uncertain behavior of skin: Secondary | ICD-10-CM

## 2019-07-15 DIAGNOSIS — I10 Essential (primary) hypertension: Secondary | ICD-10-CM

## 2019-07-17 ENCOUNTER — Telehealth: Payer: Self-pay | Admitting: *Deleted

## 2019-07-17 NOTE — Telephone Encounter (Signed)
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  Patient was advised to monitor the site and call if lesion reappears.

## 2019-07-30 ENCOUNTER — Encounter: Payer: Self-pay | Admitting: Internal Medicine

## 2019-07-30 NOTE — Patient Instructions (Signed)

## 2019-07-30 NOTE — Progress Notes (Signed)
Annual  Screening/Preventative Visit  & Comprehensive Evaluation & Examination     This very nice 59 y.o. MWM  presents for a Screening /Preventative Visit & comprehensive evaluation and management of multiple medical co-morbidities.  Patient has been followed for HTN, HLD, Prediabetes and Vitamin D Deficiency. Patient has GERD controlled on his meds.     HTN predates since 2008. Patient's BP has been controlled at home.  Today's BP is at goal - 138/82.  In 2008, patient had an isolated episode of pAfib & was seen by Dr Johnsie Cancel and recommended  Diltiazem and Flecanamide as "pill in pocket" rescue if needed. Patient denies any cardiac symptoms as chest pain, palpitations, shortness of breath, dizziness or ankle swelling.     Patient's hyperlipidemia is controlled with diet and Crestor. Patient denies myalgias or other medication SE's. Last lipids were at goal except slightly elevated Trig's:  Lab Results  Component Value Date   CHOL 134 01/10/2019   HDL 42 01/10/2019   LDLCALC 67 01/10/2019   TRIG 167 (H) 01/10/2019   CHOLHDL 3.2 01/10/2019      Patient has hx/o prediabetes (A1c 5.9% /Apr 2016)  and patient denies reactive hypoglycemic symptoms, visual blurring, diabetic polys or paresthesias. Last A1c was Normal & at goal:  Lab Results  Component Value Date   HGBA1C 5.3 07/03/2018       Finally, patient has history of Vitamin D Deficiency ("17" / 2008) and last vitamin D was at goal:  Lab Results  Component Value Date   VD25OH 40 07/03/2018   Current Outpatient Medications on File Prior to Visit  Medication Sig  . Ascorbic Acid (VITAMIN C) 1000 MG tablet Take 500 mg by mouth daily.   Marland Kitchen aspirin 81 MG tablet Take 81 mg by mouth daily.  . CHELATED ZINC PO Take 50 mg by mouth daily.  . cholecalciferol (VITAMIN D) 1000 UNITS tablet Take 5,000 Units by mouth daily.   Marland Kitchen CINNAMON PO Take 1,000 mg by mouth. Take 1000mg  by mouth BID  . Cyanocobalamin (VITAMIN B-12 PO) Take 3,000 mcg by  mouth.  . famotidine (PEPCID) 40 MG tablet Take 1 tablet at bedtime for Acid Reflux  . finasteride (PROSCAR) 5 MG tablet Take 1 tablet Daily for Prostate  . fish oil-omega-3 fatty acids 1000 MG capsule Take 2 g by mouth daily.  . flecainide (TAMBOCOR) 100 MG tablet Take 3 tablets by mouth as needed for onset of afib  . GARLIC PO Take by mouth 2 (two) times daily.  . pantoprazole (PROTONIX) 40 MG tablet Take 1 tablet every morning for Acid indigestion & Reflux  . rosuvastatin (CRESTOR) 40 MG tablet Take  1 tablet daily for Cholesterol  . TURMERIC PO Take by mouth.   No current facility-administered medications on file prior to visit.   No Known Allergies   Past Medical History:  Diagnosis Date  . Anemia   . Arthritis   . Diverticulitis   . GERD (gastroesophageal reflux disease)   . Hyperlipidemia   . Other testicular hypofunction   . Paroxysmal atrial fibrillation (HCC)   . Sigmoid diverticulitis 05/21/2018   Health Maintenance  Topic Date Due  . Hepatitis C Screening  11-May-1961  . HIV Screening  04/15/1974  . TETANUS/TDAP  06/27/2021  . COLONOSCOPY  10/11/2025  . INFLUENZA VACCINE  Completed   Immunization History  Administered Date(s) Administered  . Influenza Inj Mdck Quad Pf 05/17/2019  . Influenza Inj Mdck Quad With Preservative 05/31/2017  . Influenza  Split 04/16/2013, 04/22/2014, 04/27/2015  . Influenza-Unspecified 04/18/2016, 04/24/2018  . PPD Test 04/22/2014, 04/27/2015, 05/03/2016, 05/31/2017, 07/03/2018, 07/31/2019  . Pneumococcal Polysaccharide-23 04/12/2012  . Tdap 06/28/2011   Last Colon - 10/12/2018 - Dr Ardis Hughs - Recc 7 yr f/u - due Apr 2027  Past Surgical History:  Procedure Laterality Date  . CHONDROPLASTY Left 09/15/2016   Procedure: CHONDROPLASTY;  Surgeon: Ninetta Lights, MD;  Location: [facility];  Service: Orthopedics;  Laterality: Left;  . COLONOSCOPY  last 06/15/2009  . KNEE ARTHROSCOPY W/ MENISCAL REPAIR  2009   right  . KNEE  ARTHROSCOPY WITH MEDIAL MENISECTOMY Left 09/15/2016   Procedure: LEFT KNEE ARTHROSCOPY CHONDROPLASTY  WITH MEDIAL MENISECTOMY;  Surgeon: Ninetta Lights, MD;  Location: [facility];  Service: Orthopedics;  Laterality: Left;  . PROXIMAL INTERPHALANGEAL FUSION (PIP) Left 05/14/2013   Procedure: RECONSTRUCTION BOUTONNIERE (LEFT SMALL FINGER) PINNING PROXIMAL INTERPHALANGEAL FUSION (PIP);  Surgeon: Wynonia Sours, MD;  Location: [facility];  Service: Orthopedics;  Laterality: Left;   Family History  Problem Relation Age of Onset  . Transient ischemic attack Mother   . Stroke Father   . Heart disease Father   . Hypertension Father   . Cancer Maternal Grandmother        colon   . Colon cancer Maternal Grandmother 62  . Stomach cancer Neg Hx   . Rectal cancer Neg Hx   . Esophageal cancer Neg Hx   . Liver cancer Neg Hx   . Colon polyps Neg Hx    Social History   Socioeconomic History  . Marital status: Married    Spouse name: Not on file  . Number of children: 2  . Years of education: Not on file  . Highest education level: Not on file  Occupational History  . Occupation: Development worker, international aid: [employer]  Tobacco Use  . Smoking status: Never Smoker  . Smokeless tobacco: Never Used  Substance and Sexual Activity  . Alcohol use: No  . Drug use: No  . Sexual activity: Not on file    ROS Constitutional: Denies fever, chills, weight loss/gain, headaches, insomnia,  night sweats or change in appetite. Does c/o fatigue. Eyes: Denies redness, blurred vision, diplopia, discharge, itchy or watery eyes.  ENT: Denies discharge, congestion, post nasal drip, epistaxis, sore throat, earache, hearing loss, dental pain, Tinnitus, Vertigo, Sinus pain or snoring.  Cardio: Denies chest pain, palpitations, irregular heartbeat, syncope, dyspnea, diaphoresis, orthopnea, PND, claudication or edema Respiratory: denies cough, dyspnea, DOE, pleurisy, hoarseness, laryngitis or  wheezing.  Gastrointestinal: Denies dysphagia, heartburn, reflux, water brash, pain, cramps, nausea, vomiting, bloating, diarrhea, constipation, hematemesis, melena, hematochezia, jaundice or hemorrhoids Genitourinary: Denies dysuria, frequency, urgency, nocturia, hesitancy, discharge, hematuria or flank pain Musculoskeletal: Denies arthralgia, myalgia, stiffness, Jt. Swelling, pain, limp or strain/sprain. Denies Falls. Skin: Denies puritis, rash, hives, warts, acne, eczema or change in skin lesion Neuro: No weakness, tremor, incoordination, spasms, paresthesia or pain Psychiatric: Denies confusion, memory loss or sensory loss. Denies Depression. Endocrine: Denies change in weight, skin, hair change, nocturia, and paresthesia, diabetic polys, visual blurring or hyper / hypo glycemic episodes.  Heme/Lymph: No excessive bleeding, bruising or enlarged lymph nodes.  Physical Exam  BP 138/82   Pulse 72   Temp 97.8 F (36.6 C)   Resp 16   Ht 6\' 1"  (1.854 m)   Wt 198 lb 3.2 oz (89.9 kg)   BMI 26.15 kg/m   General Appearance: Well nourished and well  groomed and in no apparent distress.  Eyes: PERRLA, EOMs, conjunctiva no swelling or erythema, normal fundi and vessels. Sinuses: No frontal/maxillary tenderness ENT/Mouth: EACs patent / TMs  nl. Nares clear without erythema, swelling, mucoid exudates. Oral hygiene is good. No erythema, swelling, or exudate. Tongue normal, non-obstructing. Tonsils not swollen or erythematous. Hearing normal.  Neck: Supple, thyroid not palpable. No bruits, nodes or JVD. Respiratory: Respiratory effort normal.  BS equal and clear bilateral without rales, rhonci, wheezing or stridor. Cardio: Heart sounds are normal with regular rate and rhythm and no murmurs, rubs or gallops. Peripheral pulses are normal and equal bilaterally without edema. No aortic or femoral bruits. Chest: symmetric with normal excursions and percussion.  Abdomen: Soft, with Nl bowel sounds.  Nontender, no guarding, rebound, hernias, masses, or organomegaly.  Lymphatics: Non tender without lymphadenopathy.  Musculoskeletal: Full ROM all peripheral extremities, joint stability, 5/5 strength, and normal gait. Skin: Warm and dry without rashes, lesions, cyanosis, clubbing or  ecchymosis.  Neuro: Cranial nerves intact, reflexes equal bilaterally. Normal muscle tone, no cerebellar symptoms. Sensation intact.  Pysch: Alert and oriented X 3 with normal affect, insight and judgment appropriate.   Assessment and Plan  1. Annual Preventative/Screening Exam   2. Essential hypertension  - EKG 12-Lead - [place], retroperitnl abd,  ltd - Urinalysis, Routine w reflex microscopic - CBC with Diff - COMPLETE METABOLIC PANEL WITH GFR - Magnesium - TSH  3. Hyperlipidemia, mixed  - EKG 12-Lead - [place], retroperitnl abd,  ltd - Lipid Profile - TSH  4. Abnormal glucose  - EKG 12-Lead - [place], retroperitnl abd,  ltd - Hemoglobin A1c ([facility]) - Insulin, random  5. Vitamin D deficiency  - Insulin, random  6. Testosterone deficiency  - Testosterone, Total  7. Paroxysmal atrial fibrillation (HCC)  - EKG 12-Lead  8. Screening for colorectal cancer  - POC Hemoccult Bld/Stl   9. Screening examination for pulmonary tuberculosis  - TB Skin Test  10. Screening for ischemic heart disease  - EKG 12-Lead  11. FHx: heart disease  - EKG 12-Lead - [place], retroperitnl abd,  ltd  12. Screening for AAA (aortic abdominal aneurysm)  - [place], retroperitnl abd,  ltd  13. Fatigue  - Iron,Total/Total Iron Binding Cap - Vitamin B12 - Testosterone, Total - CBC with Diff - TSH  14. Medication management  - Urinalysis, Routine w reflex microscopic - CBC with Diff - COMPLETE METABOLIC PANEL WITH GFR - Magnesium - Lipid Profile - TSH - Hemoglobin A1c ([facility]) - Insulin, random - Vitamin D (25 hydroxy)  15. Screening for prostate cancer  - PSA        Patient was counseled in prudent  diet, weight control to achieve/maintain BMI less than 25, BP monitoring, regular exercise and medications as discussed.  Discussed med effects and SE's. Routine screening labs and tests as requested with regular follow-up as recommended. Over 40 minutes of exam, counseling, chart review and high complex critical decision making was performed   Kirtland Bouchard, MD

## 2019-07-31 ENCOUNTER — Other Ambulatory Visit: Payer: Self-pay

## 2019-07-31 ENCOUNTER — Ambulatory Visit: Payer: 59 | Admitting: Internal Medicine

## 2019-07-31 VITALS — BP 138/82 | HR 72 | Temp 97.8°F | Resp 16 | Ht 73.0 in | Wt 198.2 lb

## 2019-07-31 DIAGNOSIS — I10 Essential (primary) hypertension: Secondary | ICD-10-CM

## 2019-07-31 DIAGNOSIS — Z0000 Encounter for general adult medical examination without abnormal findings: Secondary | ICD-10-CM | POA: Diagnosis not present

## 2019-07-31 DIAGNOSIS — Z111 Encounter for screening for respiratory tuberculosis: Secondary | ICD-10-CM | POA: Diagnosis not present

## 2019-07-31 DIAGNOSIS — Z1211 Encounter for screening for malignant neoplasm of colon: Secondary | ICD-10-CM

## 2019-07-31 DIAGNOSIS — R5383 Other fatigue: Secondary | ICD-10-CM

## 2019-07-31 DIAGNOSIS — Z125 Encounter for screening for malignant neoplasm of prostate: Secondary | ICD-10-CM

## 2019-07-31 DIAGNOSIS — E782 Mixed hyperlipidemia: Secondary | ICD-10-CM

## 2019-07-31 DIAGNOSIS — E349 Endocrine disorder, unspecified: Secondary | ICD-10-CM

## 2019-07-31 DIAGNOSIS — E559 Vitamin D deficiency, unspecified: Secondary | ICD-10-CM

## 2019-07-31 DIAGNOSIS — R7309 Other abnormal glucose: Secondary | ICD-10-CM

## 2019-07-31 DIAGNOSIS — Z79899 Other long term (current) drug therapy: Secondary | ICD-10-CM

## 2019-07-31 DIAGNOSIS — Z136 Encounter for screening for cardiovascular disorders: Secondary | ICD-10-CM | POA: Diagnosis not present

## 2019-07-31 DIAGNOSIS — Z0001 Encounter for general adult medical examination with abnormal findings: Secondary | ICD-10-CM

## 2019-07-31 DIAGNOSIS — Z1212 Encounter for screening for malignant neoplasm of rectum: Secondary | ICD-10-CM

## 2019-07-31 DIAGNOSIS — I480 Paroxysmal atrial fibrillation: Secondary | ICD-10-CM

## 2019-07-31 DIAGNOSIS — Z8249 Family history of ischemic heart disease and other diseases of the circulatory system: Secondary | ICD-10-CM | POA: Diagnosis not present

## 2019-07-31 DIAGNOSIS — M26622 Arthralgia of left temporomandibular joint: Secondary | ICD-10-CM

## 2019-07-31 MED ORDER — PREDNISONE 20 MG PO TABS: ORAL_TABLET | ORAL | 1 refills | Status: DC

## 2019-08-01 LAB — LIPID PANEL
Cholesterol: 156 mg/dL (ref <200)
HDL: 49 mg/dL (ref >=40)
LDL Cholesterol (Calc): 82 mg/dL (calc)
Non-HDL Cholesterol (Calc): 107 mg/dL (calc) (ref <130)
Total CHOL/HDL Ratio: 3.2 (calc) (ref <5.0)
Triglycerides: 156 mg/dL — ABNORMAL HIGH (ref <150)

## 2019-08-01 LAB — CBC WITH DIFFERENTIAL/PLATELET
Absolute Monocytes: 398 cells/uL (ref 200–950)
Basophils Absolute: 80 cells/uL (ref 0–200)
Basophils Relative: 1.5 %
Eosinophils Absolute: 228 cells/uL (ref 15–500)
Eosinophils Relative: 4.3 %
HCT: 43.3 % (ref 38.5–50.0)
Hemoglobin: 14.9 g/dL (ref 13.2–17.1)
Lymphs Abs: 1659 cells/uL (ref 850–3900)
MCH: 30.5 pg (ref 27.0–33.0)
MCHC: 34.4 g/dL (ref 32.0–36.0)
MCV: 88.7 fL (ref 80.0–100.0)
MPV: 10.8 fL (ref 7.5–12.5)
Monocytes Relative: 7.5 %
Neutro Abs: 2936 cells/uL (ref 1500–7800)
Neutrophils Relative %: 55.4 %
Platelets: 253 10*3/uL (ref 140–400)
RBC: 4.88 10*6/uL (ref 4.20–5.80)
RDW: 12.5 % (ref 11.0–15.0)
Total Lymphocyte: 31.3 %
WBC: 5.3 10*3/uL (ref 3.8–10.8)

## 2019-08-01 LAB — COMPLETE METABOLIC PANEL WITH GFR
AG Ratio: 1.7 (calc) (ref 1.0–2.5)
ALT: 91 U/L — ABNORMAL HIGH (ref 9–46)
AST: 47 U/L — ABNORMAL HIGH (ref 10–35)
Albumin: 4.5 g/dL (ref 3.6–5.1)
Alkaline phosphatase (APISO): 48 U/L (ref 35–144)
BUN: 13 mg/dL (ref 7–25)
CO2: 30 mmol/L (ref 20–32)
Calcium: 9.3 mg/dL (ref 8.6–10.3)
Chloride: 104 mmol/L (ref 98–110)
Creat: 0.9 mg/dL (ref 0.70–1.25)
GFR, Est African American: 107 mL/min/{1.73_m2} (ref >=60)
GFR, Est Non African American: 93 mL/min/{1.73_m2} (ref >=60)
Globulin: 2.6 g/dL (calc) (ref 1.9–3.7)
Glucose, Bld: 87 mg/dL (ref 65–99)
Potassium: 4.4 mmol/L (ref 3.5–5.3)
Sodium: 139 mmol/L (ref 135–146)
Total Bilirubin: 0.5 mg/dL (ref 0.2–1.2)
Total Protein: 7.1 g/dL (ref 6.1–8.1)

## 2019-08-01 LAB — INSULIN, RANDOM: Insulin: 8.1 u[IU]/mL

## 2019-08-01 LAB — URINALYSIS, ROUTINE W REFLEX MICROSCOPIC
Bilirubin Urine: NEGATIVE
Glucose, UA: NEGATIVE
Hgb urine dipstick: NEGATIVE
Ketones, ur: NEGATIVE
Leukocytes,Ua: NEGATIVE
Nitrite: NEGATIVE
Protein, ur: NEGATIVE
Specific Gravity, Urine: 1.015 (ref 1.001–1.03)
pH: 7 (ref 5.0–8.0)

## 2019-08-01 LAB — IRON, TOTAL/TOTAL IRON BINDING CAP
%SAT: 33 % (calc) (ref 20–48)
Iron: 106 ug/dL (ref 50–180)
TIBC: 317 mcg/dL (calc) (ref 250–425)

## 2019-08-01 LAB — HEMOGLOBIN A1C
Hgb A1c MFr Bld: 5.5 % of total Hgb (ref <5.7)
Mean Plasma Glucose: 111 (calc)
eAG (mmol/L): 6.2 (calc)

## 2019-08-01 LAB — PSA: PSA: 0.9 ng/mL (ref <=4.0)

## 2019-08-01 LAB — VITAMIN D 25 HYDROXY (VIT D DEFICIENCY, FRACTURES): Vit D, 25-Hydroxy: 66 ng/mL (ref 30–100)

## 2019-08-01 LAB — TESTOSTERONE: Testosterone: 416 ng/dL (ref 250–827)

## 2019-08-01 LAB — MAGNESIUM: Magnesium: 2.2 mg/dL (ref 1.5–2.5)

## 2019-08-01 LAB — TSH: TSH: 3.18 mIU/L (ref 0.40–4.50)

## 2019-08-01 LAB — VITAMIN B12: Vitamin B-12: >2000 pg/mL — ABNORMAL HIGH (ref 200–1100)

## 2019-08-05 LAB — TB SKIN TEST
Induration: 0 mm
TB Skin Test: NEGATIVE

## 2019-10-10 ENCOUNTER — Ambulatory Visit: Payer: 59 | Attending: Internal Medicine

## 2019-10-10 DIAGNOSIS — Z23 Encounter for immunization: Secondary | ICD-10-CM

## 2019-10-10 NOTE — Progress Notes (Signed)
   Covid-19 Vaccination Clinic  Name:  Jack Russell    MRN: [id number] DOB: 1960-10-10  10/10/2019  [name] was observed post Covid-19 immunization for 15 minutes without incident. He was provided with Vaccine Information Sheet and instruction to access the V-Safe system.   [name] was instructed to call 911 with any severe reactions post vaccine: Marland Kitchen Difficulty breathing  . Swelling of face and throat  . A fast heartbeat  . A bad rash all over body  . Dizziness and weakness   Immunizations Administered    Name Date Dose VIS Date Route   Pfizer COVID-19 Vaccine 10/10/2019  4:20 PM 0.3 mL 07/05/2019 Intramuscular   Manufacturer: [place]   Lot: [id number]   [place]: [id number]

## 2019-11-01 NOTE — Progress Notes (Signed)
FOLLOW UP  Assessment and Plan:   Paroxysmal a fib Rate controlled today; avoid triggers; flecainide PRN recurrence Continue with current medications for rate control, ASA Followed by Dr. Johnsie Cancel  Hypertension Well controlled with current medications  Monitor blood pressure at home; patient to call if consistently greater than 130/80 Continue DASH diet.   Reminder to go to the ER if any CP, SOB, nausea, dizziness, severe HA, changes vision/speech, left arm numbness and tingling and jaw pain.  Cholesterol On rosuvastatin 20 mg and tolerating well  Continue low cholesterol diet and exercise.  Check lipid panel.   Other abnormal glucose Continue diet and exercise.  Perform daily foot/skin check, notify office of any concerning changes.  Check CMP/GFR  BMI 26 Long discussion about weight loss, diet, and exercise Recommended diet heavy in fruits and veggies and low in animal meats, cheeses, and dairy products, appropriate calorie intake Discussed ideal weight for height Will follow up in 3 months  Vitamin D Def At goal at last visit; continue supplementation for goal of 60-100 Defer Vit D level  GERD Well managed on current medications; doing well alternating PPI and H2i Discussed diet, avoiding triggers and other lifestyle changes  Actinic keratosis 2 freeze and thaw technique after verbal permission Tolerated well; protect skin from sun, monitor for signs of infection After care info provided on AVS   Continue diet and meds as discussed. Further disposition pending results of labs. Discussed med's effects and SE's.   Over 30 minutes of exam, counseling, chart review, and critical decision making was performed.   Future Appointments  Date Time Provider [place]  02/10/2020  2:30 PM [name], MD [facility] None  08/19/2020  2:00 PM [name], MD [facility] None     ----------------------------------------------------------------------------------------------------------------------  HPI 59 y.o. male  presents for 6 month follow up on hypertension, cholesterol, glucose management, weight and vitamin D deficiency.  he has a diagnosis of GERD which is currently managed by alternating famotidine 40 mg, protonix 40 mg.  Had NSAID gastritis with anemia in 09/2016 he reports symptoms is currently well controlled, and denies breakthrough reflux, burning in chest, hoarseness or cough.    BMI is Body mass index is 26.36 kg/m., he has been working on diet and exercise. He has set a goal of 185 lb by the end of this. He plans to start walking again, has been spending more time in yard mowing. He estimates 48 fluid ounces of water daily.  Wt Readings from Last 3 Encounters:  11/06/19 199 lb 12.8 oz (90.6 kg)  07/31/19 198 lb 3.2 oz (89.9 kg)  07/15/19 195 lb 12.8 oz (88.8 kg)   Patient has hx/o pAfib (CHADsVasc = 0) since 2012 and in recent years transitioned to flecainide PRN plan and is doing well, had mild episode recently while taking prednisone, quickly resolved with flecainide.  His blood pressure has been controlled at home, today their BP is BP: 122/82  He does workout. He denies chest pain, shortness of breath, dizziness.   He is on cholesterol medication (rosuvastatin 20 mg daily) and denies myalgias. His LDL cholesterol is at goal, trigs mildly elevated. The cholesterol last visit was:   Lab Results  Component Value Date   CHOL 156 07/31/2019   HDL 49 07/31/2019   LDLCALC 82 07/31/2019   TRIG 156 (H) 07/31/2019   CHOLHDL 3.2 07/31/2019    He has been working on diet and exercise for glucose management, and denies foot ulcerations, increased appetite, nausea, paresthesia  of the feet, polydipsia, polyuria, visual disturbances, vomiting and weight loss. Last A1C in the office was:  Lab Results  Component Value Date   HGBA1C 5.5 07/31/2019    Patient is on Vitamin D supplement   Lab Results  Component Value Date   VD25OH 66 07/31/2019     He has a history of testosterone deficiency and is on zinc supplement and feels doing well with this.  Lab Results  Component Value Date   TESTOSTERONE 416 07/31/2019   He reports history of BPH, recently back on finasteride; in the last year having 1 episode of nocturia, denies slow urine stream, difficulty emptying bladder.  Lab Results  Component Value Date   PSA 0.9 07/31/2019   PSA 1.0 05/31/2017   PSA 1.0 05/03/2016     Current Medications:  Current Outpatient Medications on File Prior to Visit  Medication Sig  . Ascorbic Acid (VITAMIN C) 1000 MG tablet Take 500 mg by mouth daily.   Marland Kitchen aspirin 81 MG tablet Take 81 mg by mouth daily.  . CHELATED ZINC PO Take 50 mg by mouth daily.  . cholecalciferol (VITAMIN D) 1000 UNITS tablet Take 5,000 Units by mouth daily.   Marland Kitchen CINNAMON PO Take 1,000 mg by mouth. Take 1000mg  by mouth BID  . Cyanocobalamin (VITAMIN B-12 PO) Take 3,000 mcg by mouth.  . famotidine (PEPCID) 40 MG tablet Take 1 tablet at bedtime for Acid Reflux  . finasteride (PROSCAR) 5 MG tablet Take 1 tablet Daily for Prostate  . fish oil-omega-3 fatty acids 1000 MG capsule Take 2 g by mouth daily.  . flecainide (TAMBOCOR) 100 MG tablet Take 3 tablets by mouth as needed for onset of afib  . GARLIC PO Take by mouth 2 (two) times daily.  . LUTEIN PO Take by mouth daily.  . pantoprazole (PROTONIX) 40 MG tablet Take 1 tablet every morning for Acid indigestion & Reflux  . rosuvastatin (CRESTOR) 40 MG tablet Take  1 tablet daily for Cholesterol  . TURMERIC PO Take by mouth.   No current facility-administered medications on file prior to visit.     Allergies: No Known Allergies   Medical History:  Past Medical History:  Diagnosis Date  . Anemia   . Arthritis   . Diverticulitis   . GERD (gastroesophageal reflux disease)   . Hyperlipidemia   . Other testicular  hypofunction   . Paroxysmal atrial fibrillation (HCC)   . Sigmoid diverticulitis 05/21/2018   Family history- Reviewed and unchanged Social history- Reviewed and unchanged   Review of Systems:  Review of Systems  Constitutional: Negative for malaise/fatigue and weight loss.  HENT: Negative for hearing loss and tinnitus.   Eyes: Negative for blurred vision and double vision.  Respiratory: Negative for cough, shortness of breath and wheezing.   Cardiovascular: Negative for chest pain, palpitations, orthopnea, claudication and leg swelling.  Gastrointestinal: Negative for abdominal pain, blood in stool, constipation, diarrhea, heartburn, melena, nausea and vomiting.  Genitourinary: Negative.   Musculoskeletal: Negative for joint pain and myalgias.  Skin: Negative for rash.  Neurological: Negative for dizziness, tingling, sensory change, weakness and headaches.  Endo/Heme/Allergies: Negative for polydipsia.  Psychiatric/Behavioral: Negative.   All other systems reviewed and are negative.     Physical Exam: BP 122/82   Pulse 78   Temp (!) 97.3 F (36.3 C)   Ht 6\' 1"  (1.854 m)   Wt 199 lb 12.8 oz (90.6 kg)   SpO2 96%   BMI 26.36 kg/m  Wt Readings  from Last 3 Encounters:  11/06/19 199 lb 12.8 oz (90.6 kg)  07/31/19 198 lb 3.2 oz (89.9 kg)  07/15/19 195 lb 12.8 oz (88.8 kg)   General Appearance: Well nourished, in no apparent distress. Eyes: PERRLA, EOMs, conjunctiva no swelling or erythema Sinuses: No Frontal/maxillary tenderness ENT/Mouth: Ext aud canals clear, TMs without erythema, bulging. No erythema, swelling, or exudate on post pharynx.  Tonsils not swollen or erythematous. Hearing normal.  Neck: Supple, thyroid normal.  Respiratory: Respiratory effort normal, BS equal bilaterally without rales, rhonchi, wheezing or stridor.  Cardio: RRR with no MRGs. Brisk peripheral pulses without edema.  Abdomen: Soft, + BS.  Non tender, no guarding, rebound, hernias,  masses. Lymphatics: Non tender without lymphadenopathy.  Musculoskeletal: Full ROM, 5/5 strength, Normal gait Skin: Warm, dry without rashes; he has 4 scaly lesions with erythematous bases to RUE and R side of neck Neuro: Cranial nerves intact. No cerebellar symptoms.  Psych: Awake and oriented X 3, normal affect, Insight and Judgment appropriate.    Izora Ribas, NP 2:56 PM [facility]

## 2019-11-04 ENCOUNTER — Ambulatory Visit: Payer: 59 | Attending: Internal Medicine

## 2019-11-04 DIAGNOSIS — Z23 Encounter for immunization: Secondary | ICD-10-CM

## 2019-11-04 NOTE — Progress Notes (Signed)
   Covid-19 Vaccination Clinic  Name:  Jack Russell    MRN: [id number] DOB: [birth year]  11/04/2019  [name] was observed post Covid-19 immunization for 15 minutes without incident. He was provided with Vaccine Information Sheet and instruction to access the V-Safe system.   [name] was instructed to call 911 with any severe reactions post vaccine: Marland Kitchen Difficulty breathing  . Swelling of face and throat  . A fast heartbeat  . A bad rash all over body  . Dizziness and weakness   Immunizations Administered    Name Date Dose VIS Date Route   Pfizer COVID-19 Vaccine 11/04/2019  4:30 PM 0.3 mL 07/05/2019 Intramuscular   Manufacturer: [place]   Lot: [id number]   [place]: [id number]

## 2019-11-06 ENCOUNTER — Encounter: Payer: Self-pay | Admitting: Adult Health

## 2019-11-06 ENCOUNTER — Ambulatory Visit: Payer: 59 | Admitting: Adult Health

## 2019-11-06 ENCOUNTER — Other Ambulatory Visit: Payer: Self-pay

## 2019-11-06 VITALS — BP 122/82 | HR 78 | Temp 97.3°F | Ht 73.0 in | Wt 199.8 lb

## 2019-11-06 DIAGNOSIS — K219 Gastro-esophageal reflux disease without esophagitis: Secondary | ICD-10-CM | POA: Diagnosis not present

## 2019-11-06 DIAGNOSIS — E559 Vitamin D deficiency, unspecified: Secondary | ICD-10-CM

## 2019-11-06 DIAGNOSIS — I480 Paroxysmal atrial fibrillation: Secondary | ICD-10-CM

## 2019-11-06 DIAGNOSIS — E782 Mixed hyperlipidemia: Secondary | ICD-10-CM

## 2019-11-06 DIAGNOSIS — L570 Actinic keratosis: Secondary | ICD-10-CM | POA: Diagnosis not present

## 2019-11-06 DIAGNOSIS — Z79899 Other long term (current) drug therapy: Secondary | ICD-10-CM

## 2019-11-06 DIAGNOSIS — I10 Essential (primary) hypertension: Secondary | ICD-10-CM | POA: Diagnosis not present

## 2019-11-06 DIAGNOSIS — R7309 Other abnormal glucose: Secondary | ICD-10-CM

## 2019-11-06 DIAGNOSIS — Z6826 Body mass index (BMI) 26.0-26.9, adult: Secondary | ICD-10-CM

## 2019-11-06 NOTE — Patient Instructions (Signed)
Goals    . DIET - INCREASE WATER INTAKE     65-80+ fluid ounces daily     . Exercise 150 min/wk Moderate Activity    . Weight (lb) < 185 lb (83.9 kg)       Keep areas on arm protected from sun; cover with bandaid or long sleeves Should scab up and fall off over next 2 weeks Monitor for any signs of injection; topical triple antibiotic only if redness or purulent discharge; follow up if any concerns    Cryosurgery for Skin Conditions, Care After These instructions give you information on caring for yourself after your procedure. Your doctor may also give you more specific instructions. Call your doctor if you have any problems or questions after your procedure. Follow these instructions at home: Caring for the treated area   Follow instructions from your doctor about how to take care of the treated area. Make sure you: ? Keep the area covered with a bandage (dressing) until it heals, or for as long as told by your doctor. ? Wash your hands with soap and water before you change your bandage. If you do not have soap and water, use hand sanitizer. ? Change your bandage as told by your doctor. ? Keep the bandage and the treated area clean and dry. If the bandage gets wet, change it right away. ? Clean the treated area with soap and water.  Check the treated area every day for signs of infection. Check for: ? More redness, swelling, or pain. ? More fluid or blood. ? Warmth. ? Pus or a bad smell. General instructions  Do not pick at your blister. Do not try to break it open. This can cause infection and scarring.  Do not put any medicine, cream, or lotion on the treated area unless told by your doctor.  Take over-the-counter and prescription medicines only as told by your doctor.  Keep all follow-up visits as told by your doctor. This is important. Contact a doctor if:  You have more redness, swelling, or pain around the treated area.  You have more fluid or blood coming from  the treated area.  The treated area feels warm to the touch.  You have pus or a bad smell coming from the treated area.  Your blister gets large and painful. Get help right away if:  You have a fever and have redness spreading from the treated area. Summary  You should keep the treated area and your bandage clean and dry.  Check the treated area every day for signs of infection. Signs include fluid, pus, warmth, or having more redness, swelling, or pain.  Do not pick at your blister. Do not try to break it open. This information is not intended to replace advice given to you by your health care provider. Make sure you discuss any questions you have with your health care provider. Document Revised: 06/23/2017 Document Reviewed: 05/30/2016 Elsevier Patient Education  2020 [employer].

## 2019-11-07 LAB — COMPLETE METABOLIC PANEL WITH GFR
AG Ratio: 1.7 (calc) (ref 1.0–2.5)
ALT: 26 U/L (ref 9–46)
AST: 22 U/L (ref 10–35)
Albumin: 4.2 g/dL (ref 3.6–5.1)
Alkaline phosphatase (APISO): 49 U/L (ref 35–144)
BUN: 15 mg/dL (ref 7–25)
CO2: 26 mmol/L (ref 20–32)
Calcium: 9.3 mg/dL (ref 8.6–10.3)
Chloride: 106 mmol/L (ref 98–110)
Creat: 1.05 mg/dL (ref 0.70–1.25)
GFR, Est African American: 89 mL/min/{1.73_m2} (ref >=60)
GFR, Est Non African American: 77 mL/min/{1.73_m2} (ref >=60)
Globulin: 2.5 g/dL (calc) (ref 1.9–3.7)
Glucose, Bld: 116 mg/dL — ABNORMAL HIGH (ref 65–99)
Potassium: 4 mmol/L (ref 3.5–5.3)
Sodium: 140 mmol/L (ref 135–146)
Total Bilirubin: 0.5 mg/dL (ref 0.2–1.2)
Total Protein: 6.7 g/dL (ref 6.1–8.1)

## 2019-11-07 LAB — CBC WITH DIFFERENTIAL/PLATELET
Absolute Monocytes: 420 cells/uL (ref 200–950)
Basophils Absolute: 70 cells/uL (ref 0–200)
Basophils Relative: 1.4 %
Eosinophils Absolute: 370 cells/uL (ref 15–500)
Eosinophils Relative: 7.4 %
HCT: 42.8 % (ref 38.5–50.0)
Hemoglobin: 14.6 g/dL (ref 13.2–17.1)
Lymphs Abs: 1375 cells/uL (ref 850–3900)
MCH: 30.7 pg (ref 27.0–33.0)
MCHC: 34.1 g/dL (ref 32.0–36.0)
MCV: 89.9 fL (ref 80.0–100.0)
MPV: 10.9 fL (ref 7.5–12.5)
Monocytes Relative: 8.4 %
Neutro Abs: 2765 cells/uL (ref 1500–7800)
Neutrophils Relative %: 55.3 %
Platelets: 210 10*3/uL (ref 140–400)
RBC: 4.76 10*6/uL (ref 4.20–5.80)
RDW: 12.5 % (ref 11.0–15.0)
Total Lymphocyte: 27.5 %
WBC: 5 10*3/uL (ref 3.8–10.8)

## 2019-11-07 LAB — LIPID PANEL
Cholesterol: 130 mg/dL (ref <200)
HDL: 46 mg/dL (ref >=40)
LDL Cholesterol (Calc): 59 mg/dL (calc)
Non-HDL Cholesterol (Calc): 84 mg/dL (calc) (ref <130)
Total CHOL/HDL Ratio: 2.8 (calc) (ref <5.0)
Triglycerides: 172 mg/dL — ABNORMAL HIGH (ref <150)

## 2019-11-07 LAB — TSH: TSH: 2.4 mIU/L (ref 0.40–4.50)

## 2019-11-07 LAB — MAGNESIUM: Magnesium: 2.1 mg/dL (ref 1.5–2.5)

## 2019-11-10 ENCOUNTER — Other Ambulatory Visit: Payer: Self-pay | Admitting: Internal Medicine

## 2019-11-10 MED ORDER — FLECAINIDE ACETATE 100 MG PO TABS: ORAL_TABLET | ORAL | 1 refills | Status: DC

## 2020-01-14 ENCOUNTER — Other Ambulatory Visit: Payer: Self-pay | Admitting: Internal Medicine

## 2020-01-14 DIAGNOSIS — K219 Gastro-esophageal reflux disease without esophagitis: Secondary | ICD-10-CM

## 2020-02-07 ENCOUNTER — Other Ambulatory Visit: Payer: Self-pay | Admitting: Internal Medicine

## 2020-02-07 DIAGNOSIS — E782 Mixed hyperlipidemia: Secondary | ICD-10-CM

## 2020-02-07 NOTE — Progress Notes (Deleted)
FOLLOW UP  Assessment and Plan:   Paroxysmal a fib Rate controlled today; avoid triggers; flecainide PRN recurrence Continue with current medications for rate control, ASA Followed by Dr. Johnsie Cancel  Hypertension Well controlled with current medications  Monitor blood pressure at home; patient to call if consistently greater than 130/80 Continue DASH diet.   Reminder to go to the ER if any CP, SOB, nausea, dizziness, severe HA, changes vision/speech, left arm numbness and tingling and jaw pain.  Cholesterol On rosuvastatin 20 mg and tolerating well  Continue low cholesterol diet and exercise.  Check lipid panel.   Other abnormal glucose Continue diet and exercise.  Perform daily foot/skin check, notify office of any concerning changes.  Check CMP/GFR  BMI 26 Long discussion about weight loss, diet, and exercise Recommended diet heavy in fruits and veggies and low in animal meats, cheeses, and dairy products, appropriate calorie intake Discussed ideal weight for height Will follow up in 3 months  Vitamin D Def At goal at last visit; continue supplementation for goal of 60-100 Defer Vit D level  GERD Well managed on current medications; doing well alternating PPI and H2i *** Discussed diet, avoiding triggers and other lifestyle changes  Actinic keratosis *** 2 freeze and thaw technique after verbal permission Tolerated well; protect skin from sun, monitor for signs of infection After care info provided on AVS   Continue diet and meds as discussed. Further disposition pending results of labs. Discussed med's effects and SE's.   Over 30 minutes of exam, counseling, chart review, and critical decision making was performed.   Future Appointments  Date Time Provider [place]  02/10/2020  2:30 PM [name], MD [facility] None  08/19/2020  2:00 PM [name], MD [facility] None     ----------------------------------------------------------------------------------------------------------------------  HPI 59 y.o. male  presents for 3 month follow up on hypertension, cholesterol, glucose management, weight and vitamin D deficiency.  he has a diagnosis of GERD which is currently managed by alternating famotidine 40 mg, protonix 40 mg. *** still need both? *** Had NSAID gastritis with anemia in 09/2016 he reports symptoms is currently well controlled, and denies breakthrough reflux, burning in chest, hoarseness or cough.    BMI is There is no height or weight on file to calculate BMI., he has been working on diet and exercise. He has set a goal of 185 lb by the end of this. He plans to start walking again, has been spending more time in yard mowing. He estimates 48 fluid ounces of water daily.  Wt Readings from Last 3 Encounters:  11/06/19 199 lb 12.8 oz (90.6 kg)  07/31/19 198 lb 3.2 oz (89.9 kg)  07/15/19 195 lb 12.8 oz (88.8 kg)   Patient has hx/o pAfib (CHADsVasc = 0 on ASA only) since 2012 and in recent years transitioned to flecainide PRN plan and is doing well, had mild episode recently while taking prednisone, quickly resolved with flecainide.  His blood pressure has been controlled at home, today their BP is    He does workout. He denies chest pain, shortness of breath, dizziness.   He is on cholesterol medication (rosuvastatin 20 mg daily) and denies myalgias. His LDL cholesterol is at goal, trigs mildly elevated. The cholesterol last visit was:   Lab Results  Component Value Date   CHOL 130 11/06/2019   HDL 46 11/06/2019   LDLCALC 59 11/06/2019   TRIG 172 (H) 11/06/2019   CHOLHDL 2.8 11/06/2019    He has been working on  diet and exercise for glucose management, and denies foot ulcerations, increased appetite, nausea, paresthesia of the feet, polydipsia, polyuria, visual disturbances, vomiting and weight loss. Last A1C in the office was:  Lab Results   Component Value Date   HGBA1C 5.5 07/31/2019   Patient is on Vitamin D supplement   Lab Results  Component Value Date   VD25OH 66 07/31/2019     He has a history of testosterone deficiency and is on zinc supplement and feels doing well with this.  Lab Results  Component Value Date   TESTOSTERONE 416 07/31/2019   He reports history of BPH, recently back on finasteride; in the last year having 1 episode of nocturia, denies slow urine stream, difficulty emptying bladder.  Lab Results  Component Value Date   PSA 0.9 07/31/2019   PSA 1.0 05/31/2017   PSA 1.0 05/03/2016     Current Medications:  Current Outpatient Medications on File Prior to Visit  Medication Sig  . Ascorbic Acid (VITAMIN C) 1000 MG tablet Take 500 mg by mouth daily.   Marland Kitchen aspirin 81 MG tablet Take 81 mg by mouth daily.  . CHELATED ZINC PO Take 50 mg by mouth daily.  . cholecalciferol (VITAMIN D) 1000 UNITS tablet Take 5,000 Units by mouth daily.   Marland Kitchen CINNAMON PO Take 1,000 mg by mouth. Take 1000mg  by mouth BID  . Cyanocobalamin (VITAMIN B-12 PO) Take 3,000 mcg by mouth.  . famotidine (PEPCID) 40 MG tablet Take 1 tablet at bedtime for Acid Reflux  . finasteride (PROSCAR) 5 MG tablet Take 1 tablet Daily for Prostate  . fish oil-omega-3 fatty acids 1000 MG capsule Take 2 g by mouth daily.  . flecainide (TAMBOCOR) 100 MG tablet Take 3 tablets by mouth as needed for onset of afib  . GARLIC PO Take by mouth 2 (two) times daily.  . LUTEIN PO Take by mouth daily.  . pantoprazole (PROTONIX) 40 MG tablet TAKE 1 TABLET EVERY MORNING FOR ACID INDIGESTION & REFLUX  . rosuvastatin (CRESTOR) 40 MG tablet Take 1 tablet Daily for Cholesterol  . TURMERIC PO Take by mouth.   No current facility-administered medications on file prior to visit.     Allergies: No Known Allergies   Medical History:  Past Medical History:  Diagnosis Date  . Anemia   . Arthritis   . Diverticulitis   . GERD (gastroesophageal reflux disease)    . Hyperlipidemia   . Other testicular hypofunction   . Paroxysmal atrial fibrillation (HCC)   . Sigmoid diverticulitis 05/21/2018   Family history- Reviewed and unchanged Social history- Reviewed and unchanged   Review of Systems:  Review of Systems  Constitutional: Negative for malaise/fatigue and weight loss.  HENT: Negative for hearing loss and tinnitus.   Eyes: Negative for blurred vision and double vision.  Respiratory: Negative for cough, shortness of breath and wheezing.   Cardiovascular: Negative for chest pain, palpitations, orthopnea, claudication and leg swelling.  Gastrointestinal: Negative for abdominal pain, blood in stool, constipation, diarrhea, heartburn, melena, nausea and vomiting.  Genitourinary: Negative.   Musculoskeletal: Negative for joint pain and myalgias.  Skin: Negative for rash.  Neurological: Negative for dizziness, tingling, sensory change, weakness and headaches.  Endo/Heme/Allergies: Negative for polydipsia.  Psychiatric/Behavioral: Negative.   All other systems reviewed and are negative.     Physical Exam: There were no vitals taken for this visit. Wt Readings from Last 3 Encounters:  11/06/19 199 lb 12.8 oz (90.6 kg)  07/31/19 198 lb 3.2 oz (89.9  kg)  07/15/19 195 lb 12.8 oz (88.8 kg)   General Appearance: Well nourished, in no apparent distress. Eyes: PERRLA, EOMs, conjunctiva no swelling or erythema Sinuses: No Frontal/maxillary tenderness ENT/Mouth: Ext aud canals clear, TMs without erythema, bulging. No erythema, swelling, or exudate on post pharynx.  Tonsils not swollen or erythematous. Hearing normal.  Neck: Supple, thyroid normal.  Respiratory: Respiratory effort normal, BS equal bilaterally without rales, rhonchi, wheezing or stridor.  Cardio: RRR with no MRGs. Brisk peripheral pulses without edema.  Abdomen: Soft, + BS.  Non tender, no guarding, rebound, hernias, masses. Lymphatics: Non tender without lymphadenopathy.   Musculoskeletal: Full ROM, 5/5 strength, Normal gait Skin: Warm, dry without rashes; he has 4 scaly lesions with erythematous bases to RUE and R side of neck Neuro: Cranial nerves intact. No cerebellar symptoms.  Psych: Awake and oriented X 3, normal affect, Insight and Judgment appropriate.    Izora Ribas, NP 3:11 PM [facility]

## 2020-02-09 ENCOUNTER — Encounter: Payer: Self-pay | Admitting: Internal Medicine

## 2020-02-09 NOTE — Patient Instructions (Signed)

## 2020-02-09 NOTE — Progress Notes (Signed)
History of Present Illness:       This very nice 59 y.o. MWM presents for 6 month follow up with HTN, HLD, Pre-Diabetes and Vitamin D Deficiency.  Patient's GERD is controlled with his meds. Patient is concerned re: early skin cancers of cheeks      Patient is treated for HTN & BP has been controlled at home. Today's BP is at goal -  118/72. Patient has had no complaints of any cardiac type chest pain, palpitations, dyspnea / orthopnea / PND, dizziness, claudication, or dependent edema.      Hyperlipidemia is controlled with diet & meds. Patient denies myalgias or other med SE's. Last Lipids were at goal except slightly elevated Trig's:  Lab Results  Component Value Date   CHOL 130 11/06/2019   HDL 46 11/06/2019   LDLCALC 59 11/06/2019   TRIG 172 (H) 11/06/2019   CHOLHDL 2.8 11/06/2019    Also, the patient has history of PreDiabetes and has had no symptoms of reactive hypoglycemia, diabetic polys, paresthesias or visual blurring.  Last A1c was Normal & at goal:  Lab Results  Component Value Date   HGBA1C 5.5 07/31/2019       Further, the patient also has history of Vitamin D Deficiency and supplements vitamin D without any suspected side-effects. Last vitamin D was at goal:  Lab Results  Component Value Date   VD25OH 66 07/31/2019    Current Outpatient Medications on File Prior to Visit  Medication Sig  . Ascorbic Acid (VITAMIN C) 1000 MG tablet Take 500 mg by mouth daily.   Marland Kitchen aspirin 81 MG tablet Take 81 mg by mouth daily.  . CHELATED ZINC PO Take 50 mg by mouth daily.  . cholecalciferol (VITAMIN D) 1000 UNITS tablet Take 5,000 Units by mouth daily.   Marland Kitchen CINNAMON PO Take 1,000 mg by mouth. Take 1000mg  by mouth BID  . Cyanocobalamin (VITAMIN B-12 PO) Take 3,000 mcg by mouth.  . famotidine (PEPCID) 40 MG tablet Take 1 tablet at bedtime for Acid Reflux  . finasteride (PROSCAR) 5 MG tablet Take 1 tablet Daily for Prostate  . fish oil-omega-3 fatty acids 1000 MG capsule  Take 2 g by mouth daily.  . flecainide (TAMBOCOR) 100 MG tablet Take 3 tablets by mouth as needed for onset of afib  . GARLIC PO Take by mouth 2 (two) times daily.  . LUTEIN PO Take by mouth daily.  . pantoprazole (PROTONIX) 40 MG tablet TAKE 1 TABLET EVERY MORNING FOR ACID INDIGESTION & REFLUX  . rosuvastatin (CRESTOR) 40 MG tablet Take 1 tablet Daily for Cholesterol  . TURMERIC PO Take by mouth.   No current facility-administered medications on file prior to visit.    No Known Allergies  PMHx:   Past Medical History:  Diagnosis Date  . Anemia   . Arthritis   . Diverticulitis   . GERD (gastroesophageal reflux disease)   . Hyperlipidemia   . Other testicular hypofunction   . Paroxysmal atrial fibrillation (HCC)   . Sigmoid diverticulitis 05/21/2018    Immunization History  Administered Date(s) Administered  . Influenza Inj Mdck Quad Pf 05/17/2019  . Influenza Inj Mdck Quad With Preservative 05/31/2017  . Influenza Split 04/16/2013, 04/22/2014, 04/27/2015  . Influenza-Unspecified 04/18/2016, 04/24/2018  . PFIZER SARS-COV-2 Vaccination 10/10/2019, 11/04/2019  . PPD Test 04/22/2014, 04/27/2015, 05/03/2016, 05/31/2017, 07/03/2018, 07/31/2019  . Pneumococcal Polysaccharide-23 04/12/2012  . Tdap 06/28/2011    Past Surgical History:  Procedure Laterality Date  .  CHONDROPLASTY Left 09/15/2016   Procedure: CHONDROPLASTY;  Surgeon: Ninetta Lights, MD;  Location: [facility];  Service: Orthopedics;  Laterality: Left;  . COLONOSCOPY  last 06/15/2009  . KNEE ARTHROSCOPY W/ MENISCAL REPAIR  2009   right  . KNEE ARTHROSCOPY WITH MEDIAL MENISECTOMY Left 09/15/2016   Procedure: LEFT KNEE ARTHROSCOPY CHONDROPLASTY  WITH MEDIAL MENISECTOMY;  Surgeon: Ninetta Lights, MD;  Location: [place];  Service: Orthopedics;  Laterality: Left;  . PROXIMAL INTERPHALANGEAL FUSION (PIP) Left 05/14/2013   Procedure: RECONSTRUCTION BOUTONNIERE (LEFT SMALL FINGER) PINNING  PROXIMAL INTERPHALANGEAL FUSION (PIP);  Surgeon: Wynonia Sours, MD;  Location: [place];  Service: Orthopedics;  Laterality: Left;    FHx:    Reviewed / unchanged  SHx:    Reviewed / unchanged   Systems Review:  Constitutional: Denies fever, chills, wt changes, headaches, insomnia, fatigue, night sweats, change in appetite. Eyes: Denies redness, blurred vision, diplopia, discharge, itchy, watery eyes.  ENT: Denies discharge, congestion, post nasal drip, epistaxis, sore throat, earache, hearing loss, dental pain, tinnitus, vertigo, sinus pain, snoring.  CV: Denies chest pain, palpitations, irregular heartbeat, syncope, dyspnea, diaphoresis, orthopnea, PND, claudication or edema. Respiratory: denies cough, dyspnea, DOE, pleurisy, hoarseness, laryngitis, wheezing.  Gastrointestinal: Denies dysphagia, odynophagia, heartburn, reflux, water brash, abdominal pain or cramps, nausea, vomiting, bloating, diarrhea, constipation, hematemesis, melena, hematochezia  or hemorrhoids. Genitourinary: Denies dysuria, frequency, urgency, nocturia, hesitancy, discharge, hematuria or flank pain. Musculoskeletal: Denies arthralgias, myalgias, stiffness, jt. swelling, pain, limping or strain/sprain.  Skin: Denies pruritus, rash, hives, warts, acne, eczema or change in skin lesion(s). Neuro: No weakness, tremor, incoordination, spasms, paresthesia or pain. Psychiatric: Denies confusion, memory loss or sensory loss. Endo: Denies change in weight, skin or hair change.  Heme/Lymph: No excessive bleeding, bruising or enlarged lymph nodes.  Physical Exam  BP 118/72   Pulse 64   Temp (!) 97.2 F (36.2 C)   Resp 16   Ht 6\' 1"  (1.854 m)   Wt 191 lb 12.8 oz (87 kg)   BMI 25.30 kg/m   Appears  well nourished, well groomed  and in no distress.  Eyes: PERRLA, EOMs, conjunctiva no swelling or erythema. Sinuses: No frontal/maxillary tenderness ENT/Mouth: EAC's clear, TM's nl w/o erythema, bulging.  Nares clear w/o erythema, swelling, exudates. Oropharynx clear without erythema or exudates. Oral hygiene is good. Tongue normal, non obstructing. Hearing intact.  Neck: Supple. Thyroid not palpable. Car 2+/2+ without bruits, nodes or JVD. Chest: Respirations nl with BS clear & equal w/o rales, rhonchi, wheezing or stridor.  Cor: Heart sounds normal w/ regular rate and rhythm without sig. murmurs, gallops, clicks or rubs. Peripheral pulses normal and equal  without edema.  Abdomen: Soft & bowel sounds normal. Non-tender w/o guarding, rebound, hernias, masses or organomegaly.  Lymphatics: Unremarkable.  Musculoskeletal: Full ROM all peripheral extremities, joint stability, 5/5 strength and normal gait.  Skin: Warm, dry without exposed rashes or ecchymosis apparent. 2-3 erythematous patches of both cheeks /zygoma areas . No scaliness or ulcerated areas.  Neuro: Cranial nerves intact, reflexes equal bilaterally. Sensory-motor testing grossly intact. Tendon reflexes grossly intact.  Pysch: Alert & oriented x 3.  Insight and judgement nl & appropriate. No ideations.  Assessment and Plan:  1. Essential hypertension  - Continue medication, monitor blood pressure at home.  - Continue DASH diet.  Reminder to go to the ER if any CP,  SOB, nausea, dizziness, severe HA, changes vision/speech.  - CBC with Differential/Platelet - COMPLETE METABOLIC PANEL  WITH GFR - Magnesium - TSH  2. Hyperlipidemia, mixed  - Continue diet/meds, exercise,& lifestyle modifications.  - Continue monitor periodic cholesterol/liver & renal functions   - Lipid panel - TSH  3. Other abnormal glucose  - Continue diet, exercise  - Lifestyle modifications.  - Monitor appropriate labs.  - Hemoglobin A1c - Insulin, random  4. Vitamin D deficiency  - Continue supplementation.  - VITAMIN D 25 Hydroxy 5. Paroxysmal A-fib (HCC)  - TSH  6. Gastroesophageal reflux disease  - CBC with Differential/Platelet  7.  Medication management  - CBC with Differential/Platelet - COMPLETE METABOLIC PANEL WITH GFR - Magnesium - Lipid panel - TSH - Hemoglobin A1c - Insulin, random - VITAMIN D 25 Hydroxy        Discussed  regular exercise, BP monitoring, weight control to achieve/maintain BMI less than 25 and discussed med and SE's. Recommended labs to assess and monitor clinical status with further disposition pending results of labs.   Given Rx Efudex to try on cheeks for 2-3 weeks to see if reaction occurs.  I discussed the assessment and treatment plan with the patient. The patient was provided an opportunity to ask questions and all were answered. The patient agreed with the plan and demonstrated an understanding of the instructions.  I provided over 30 minutes of exam, counseling, chart review and  complex critical decision making.    Kirtland Bouchard, MD

## 2020-02-10 ENCOUNTER — Other Ambulatory Visit: Payer: Self-pay

## 2020-02-10 ENCOUNTER — Ambulatory Visit: Payer: 59 | Admitting: Internal Medicine

## 2020-02-10 VITALS — BP 118/72 | HR 64 | Temp 97.2°F | Resp 16 | Ht 73.0 in | Wt 191.8 lb

## 2020-02-10 DIAGNOSIS — R7309 Other abnormal glucose: Secondary | ICD-10-CM | POA: Diagnosis not present

## 2020-02-10 DIAGNOSIS — Z79899 Other long term (current) drug therapy: Secondary | ICD-10-CM

## 2020-02-10 DIAGNOSIS — E782 Mixed hyperlipidemia: Secondary | ICD-10-CM

## 2020-02-10 DIAGNOSIS — K219 Gastro-esophageal reflux disease without esophagitis: Secondary | ICD-10-CM

## 2020-02-10 DIAGNOSIS — L570 Actinic keratosis: Secondary | ICD-10-CM

## 2020-02-10 DIAGNOSIS — I480 Paroxysmal atrial fibrillation: Secondary | ICD-10-CM

## 2020-02-10 DIAGNOSIS — I10 Essential (primary) hypertension: Secondary | ICD-10-CM | POA: Diagnosis not present

## 2020-02-10 DIAGNOSIS — E559 Vitamin D deficiency, unspecified: Secondary | ICD-10-CM | POA: Diagnosis not present

## 2020-02-10 MED ORDER — FLUOROURACIL 5 % EX CREA: TOPICAL_CREAM | CUTANEOUS | 1 refills | Status: DC

## 2020-02-11 LAB — LIPID PANEL
Cholesterol: 142 mg/dL (ref <200)
HDL: 48 mg/dL (ref >=40)
LDL Cholesterol (Calc): 70 mg/dL (calc)
Non-HDL Cholesterol (Calc): 94 mg/dL (calc) (ref <130)
Total CHOL/HDL Ratio: 3 (calc) (ref <5.0)
Triglycerides: 162 mg/dL — ABNORMAL HIGH (ref <150)

## 2020-02-11 LAB — CBC WITH DIFFERENTIAL/PLATELET
Absolute Monocytes: 370 cells/uL (ref 200–950)
Basophils Absolute: 60 cells/uL (ref 0–200)
Basophils Relative: 1.2 %
Eosinophils Absolute: 320 cells/uL (ref 15–500)
Eosinophils Relative: 6.4 %
HCT: 42.1 % (ref 38.5–50.0)
Hemoglobin: 14.2 g/dL (ref 13.2–17.1)
Lymphs Abs: 1520 cells/uL (ref 850–3900)
MCH: 30.2 pg (ref 27.0–33.0)
MCHC: 33.7 g/dL (ref 32.0–36.0)
MCV: 89.6 fL (ref 80.0–100.0)
MPV: 10.9 fL (ref 7.5–12.5)
Monocytes Relative: 7.4 %
Neutro Abs: 2730 cells/uL (ref 1500–7800)
Neutrophils Relative %: 54.6 %
Platelets: 212 10*3/uL (ref 140–400)
RBC: 4.7 10*6/uL (ref 4.20–5.80)
RDW: 12.2 % (ref 11.0–15.0)
Total Lymphocyte: 30.4 %
WBC: 5 10*3/uL (ref 3.8–10.8)

## 2020-02-11 LAB — COMPLETE METABOLIC PANEL WITH GFR
AG Ratio: 1.8 (calc) (ref 1.0–2.5)
ALT: 19 U/L (ref 9–46)
AST: 18 U/L (ref 10–35)
Albumin: 4.4 g/dL (ref 3.6–5.1)
Alkaline phosphatase (APISO): 48 U/L (ref 35–144)
BUN: 18 mg/dL (ref 7–25)
CO2: 29 mmol/L (ref 20–32)
Calcium: 9.6 mg/dL (ref 8.6–10.3)
Chloride: 107 mmol/L (ref 98–110)
Creat: 1.01 mg/dL (ref 0.70–1.25)
GFR, Est African American: 93 mL/min/{1.73_m2} (ref >=60)
GFR, Est Non African American: 80 mL/min/{1.73_m2} (ref >=60)
Globulin: 2.5 g/dL (calc) (ref 1.9–3.7)
Glucose, Bld: 90 mg/dL (ref 65–99)
Potassium: 4.3 mmol/L (ref 3.5–5.3)
Sodium: 139 mmol/L (ref 135–146)
Total Bilirubin: 0.5 mg/dL (ref 0.2–1.2)
Total Protein: 6.9 g/dL (ref 6.1–8.1)

## 2020-02-11 LAB — INSULIN, RANDOM: Insulin: 8.5 u[IU]/mL

## 2020-02-11 LAB — TSH: TSH: 2.87 mIU/L (ref 0.40–4.50)

## 2020-02-11 LAB — HEMOGLOBIN A1C
Hgb A1c MFr Bld: 5.4 % of total Hgb (ref <5.7)
Mean Plasma Glucose: 108 (calc)
eAG (mmol/L): 6 (calc)

## 2020-02-11 LAB — VITAMIN D 25 HYDROXY (VIT D DEFICIENCY, FRACTURES): Vit D, 25-Hydroxy: 66 ng/mL (ref 30–100)

## 2020-02-11 LAB — MAGNESIUM: Magnesium: 2 mg/dL (ref 1.5–2.5)

## 2020-02-11 NOTE — Progress Notes (Signed)
==========================================================  -    Total Chol = 142 and LDL Chol = 70 - Bioth  Excellent   - Very low risk for Heart Attack  / Stroke ==========================================================  - A1c - Normal - Great  - No Diabetes ==========================================================  -  Vitamin D = 66 - Excellent  ==========================================================  -  All Else - CBC - Kidneys - Electrolytes - Liver - Magnesium & Thyroid    - all  Normal / OK ==========================================================

## 2020-04-05 ENCOUNTER — Other Ambulatory Visit: Payer: Self-pay | Admitting: Internal Medicine

## 2020-04-14 ENCOUNTER — Ambulatory Visit: Payer: 59 | Admitting: Adult Health Nurse Practitioner

## 2020-04-14 ENCOUNTER — Other Ambulatory Visit: Payer: Self-pay

## 2020-04-14 ENCOUNTER — Encounter: Payer: Self-pay | Admitting: Adult Health Nurse Practitioner

## 2020-04-14 VITALS — BP 138/82 | HR 75 | Temp 96.6°F | Ht 73.0 in | Wt 196.0 lb

## 2020-04-14 DIAGNOSIS — B0229 Other postherpetic nervous system involvement: Secondary | ICD-10-CM | POA: Diagnosis not present

## 2020-04-14 DIAGNOSIS — I10 Essential (primary) hypertension: Secondary | ICD-10-CM

## 2020-04-14 MED ORDER — VALACYCLOVIR HCL 1 G PO TABS: 1000.0000 mg | ORAL_TABLET | Freq: Three times a day (TID) | ORAL | 0 refills | Status: DC

## 2020-04-14 NOTE — Progress Notes (Signed)
Assessment and Plan:  Jack Russell was seen today for herpes zoster.  Diagnoses and all orders for this visit:  Other postherpetic nervous system involvement -     valACYclovir (VALTREX) 1000 MG tablet; Take 1 tablet (1,000 mg total) by mouth 3 (three) times daily. Take for 7 days Discussed monitoring area Discussed spread Contact office with any new or worsening symptoms  Essential hypertension Continue current medications: Monitor blood pressure at home; call if consistently over 130/80 Continue DASH diet.   Reminder to go to the ER if any CP, SOB, nausea, dizziness, severe HA, changes vision/speech, left arm numbness and tingling and jaw pain.     Further disposition pending results of labs. Discussed med's effects and SE's.   Over 30 minutes of face to face interview, exam, counseling, chart review, and critical decision making was performed.   Future Appointments  Date Time Provider [place]  05/12/2020  2:30 PM Garnet Sierras, NP [facility] None  08/19/2020  2:00 PM [name], MD [facility] None    ------------------------------------------------------------------------------------------------------------------   HPI 59 y.o.male presents for evaluation of rash to right buttocks that he first noticed three days ago.  Reports his wife pointed it out to him first.  Shortly after that it became slightly itchy.  Shortly after that he noticed a burning sensation right upper thigh with pain radiating toward the groin.  He denise any itching or exudate.  He reports he has not noticed this on any other areas of his body.  He reports he has not had anything like this in the past.  He has not had a shingles vaccination.       Past Medical History:  Diagnosis Date   Anemia    Arthritis    Diverticulitis    GERD (gastroesophageal reflux disease)    Hyperlipidemia    Other testicular hypofunction    Paroxysmal atrial fibrillation (HCC)    Sigmoid  diverticulitis 05/21/2018     No Known Allergies  Current Outpatient Medications on File Prior to Visit  Medication Sig   Ascorbic Acid (VITAMIN C) 1000 MG tablet Take 500 mg by mouth daily.    aspirin 81 MG tablet Take 81 mg by mouth daily.   CHELATED ZINC PO Take 50 mg by mouth daily.   cholecalciferol (VITAMIN D) 1000 UNITS tablet Take 5,000 Units by mouth daily.    CINNAMON PO Take 1,000 mg by mouth. Take 1000mg  by mouth BID   Cyanocobalamin (VITAMIN B-12 PO) Take 3,000 mcg by mouth.   famotidine (PEPCID) 40 MG tablet Take 1 tablet at bedtime for Acid Reflux   finasteride (PROSCAR) 5 MG tablet Take 1 tablet Daily for Prostate   fish oil-omega-3 fatty acids 1000 MG capsule Take 2 g by mouth daily.   flecainide (TAMBOCOR) 100 MG tablet Take 3 tablets by mouth as needed for onset of afib   GARLIC PO Take by mouth 2 (two) times daily.   LUTEIN PO Take by mouth daily.   pantoprazole (PROTONIX) 40 MG tablet TAKE 1 TABLET EVERY MORNING FOR ACID INDIGESTION & REFLUX   rosuvastatin (CRESTOR) 40 MG tablet Take 1 tablet Daily for Cholesterol   TURMERIC PO Take by mouth.   famotidine (PEPCID) 40 MG tablet Take 1 tablet at Bedtime for Heartburn & Indigestion   fluorouracil (EFUDEX) 5 % cream Apply to suspect areas 2 x / day til skin raw (Patient not taking: Reported on 04/14/2020)   No current facility-administered medications on file prior to visit.  ROS: all negative except above.   Physical Exam:  BP 138/82    Pulse 75    Temp (!) 96.6 F (35.9 C)    Ht 6\' 1"  (1.854 m)    Wt 196 lb (88.9 kg)    SpO2 97%    BMI 25.86 kg/m   General Appearance: Well nourished, in no apparent distress. Eyes: PERRLA, EOMs, conjunctiva no swelling or erythema Sinuses: No Frontal/maxillary tenderness ENT/Mouth: Ext aud canals clear, TMs without erythema, bulging. No erythema, swelling, or exudate on post pharynx.  Tonsils not swollen or erythematous. Hearing normal.  Neck: Supple,  thyroid normal.  Respiratory: Respiratory effort normal, BS equal bilaterally without rales, rhonchi, wheezing or stridor.  Cardio: RRR with no MRGs. Brisk peripheral pulses without edema.  Abdomen: Soft, + BS.  Non tender, no guarding, rebound, hernias, masses. Lymphatics: Non tender without lymphadenopathy.  Musculoskeletal: Full ROM, 5/5 strength, normal gait.  Skin: Warm, dry without lesions, ecchymosis. Right gluteal, cluster of single raised lesions.  Hypersensitive to touch. Neuro: Cranial nerves intact. Normal muscle tone, no cerebellar symptoms. Sensation intact.  Psych: Awake and oriented X 3, normal affect, Insight and Judgment appropriate.     Garnet Sierras, NP 12:07 PM [facility]

## 2020-05-11 NOTE — Progress Notes (Signed)
FOLLOW UP 3 MONTH  Assessment and Plan:    Jack Russell was seen today for follow-up.  Diagnoses and all orders for this visit:  Essential hypertension Controlled today Monitor blood pressure at home; call if consistently over 130/80 Continue DASH diet.   Reminder to go to the ER if any CP, SOB, nausea, dizziness, severe HA, changes vision/speech, left arm numbness and tingling and jaw pain. -     CBC with Differential/Platelet -     COMPLETE METABOLIC PANEL WITH GFR   Paroxysmal a fib Rate controlled today; avoid triggers; flecainide PRN recurrence Continue with current medications for rate control, ASA Followed by Dr. Johnsie Cancel  Hyperlipidemia, mixed Continue medications: rosuvastatin 40mg  Discussed dietary and exercise modifications Low fat diet -     Lipid panel  Paroxysmal A-fib (HCC) flecainide 100mg  3 times a day PRN Has not used recently Follows with Dr Johnsie Cancel  Other abnormal glucose Discussed dietary and exercise modifications  Gastroesophageal reflux disease, unspecified whether esophagitis present Doing well at this time Continue: famodtidine 40mg  every other dya. Diet discussed Monitor for triggers Avoid food with high acid content Avoid excessive cafeine Increase water intake  Vitamin D deficiency Continue supplementation to maintain goal of 70-100 Taking Vitamin D 5,000 IU daily Defer vitamin D level   BMI 26.0-26.9,adult Discussed dietary and exercise modifications  Medication management Continued  Needs flu shot -     FLU VACCINE MDCK QUAD W/Preservative  Actinic keratoses Cryo - verbal consent obtained 3 lesions to RUE and 2 lesions to LUE, 5 total.  2 freeze and thaw technique after verbal permission Tolerated well; protect skin from sun, monitor for signs of infection After care info provided on AVS   Continue diet and meds as discussed. Further disposition pending results of labs. Discussed med's effects and SE's.   Over 30 minutes of  face to face interview, exam, counseling, chart review, and critical decision making was performed.   Future Appointments  Date Time Provider [place]  08/19/2020  2:00 PM [name], MD [facility] None    ----------------------------------------------------------------------------------------------------------------------  HPI 59 y.o. male  presents for 6 month follow up on hypertension, cholesterol, glucose management, weight and vitamin D deficiency.  He reports overall he is doing well and he does not have any health or medication concerns today.  he has a diagnosis of GERD which is currently managed by alternating famotidine 40 mg, protonix 40 mg.  Had NSAID gastritis with anemia in 09/2016 he reports symptoms is currently well controlled, and denies breakthrough reflux, burning in chest, hoarseness or cough.    BMI is Body mass index is 26.2 kg/m., he has been working on diet and exercise. He has set a goal of 185 lb by the end of this. He plans to start walking again, has been spending more time in yard mowing. He estimates 48 fluid ounces of water daily.  Wt Readings from Last 3 Encounters:  05/12/20 198 lb 9.6 oz (90.1 kg)  04/14/20 196 lb (88.9 kg)  02/10/20 191 lb 12.8 oz (87 kg)   Patient has hx/o pAfib (CHADsVasc = 0) since 2012 and in recent years transitioned to flecainide PRN plan and is doing well, had mild episode recently while taking prednisone, quickly resolved with flecainide.  His blood pressure has been controlled at home, today their BP is BP: 116/72  He does workout. He denies chest pain, shortness of breath, dizziness.   He is on cholesterol medication (rosuvastatin 20 mg daily) and denies myalgias. His  LDL cholesterol is at goal, trigs mildly elevated. The cholesterol last visit was:   Lab Results  Component Value Date   CHOL 142 02/10/2020   HDL 48 02/10/2020   LDLCALC 70 02/10/2020   TRIG 162 (H) 02/10/2020   CHOLHDL 3.0 02/10/2020     He has been working on diet and exercise for glucose management, and denies foot ulcerations, increased appetite, nausea, paresthesia of the feet, polydipsia, polyuria, visual disturbances, vomiting and weight loss. Last A1C in the office was:  Lab Results  Component Value Date   HGBA1C 5.4 02/10/2020   Patient is on Vitamin D supplement   Lab Results  Component Value Date   VD25OH 66 02/10/2020     He has a history of testosterone deficiency and is on zinc supplement and feels doing well with this.  Lab Results  Component Value Date   TESTOSTERONE 416 07/31/2019   He reports history of BPH, recently back on finasteride; in the last year having 1 episode of nocturia, denies slow urine stream, difficulty emptying bladder.  Lab Results  Component Value Date   PSA 0.9 07/31/2019   PSA 1.0 05/31/2017   PSA 1.0 05/03/2016     Current Medications:  Current Outpatient Medications on File Prior to Visit  Medication Sig  . Ascorbic Acid (VITAMIN C) 1000 MG tablet Take 500 mg by mouth daily.   Marland Kitchen aspirin 81 MG tablet Take 81 mg by mouth daily.  . CHELATED ZINC PO Take 50 mg by mouth daily.  . cholecalciferol (VITAMIN D) 1000 UNITS tablet Take 5,000 Units by mouth daily.   Marland Kitchen CINNAMON PO Take 1,000 mg by mouth. Take 1000mg  by mouth BID  . Cyanocobalamin (VITAMIN B-12 PO) Take 3,000 mcg by mouth.  . famotidine (PEPCID) 40 MG tablet Take 1 tablet at bedtime for Acid Reflux  . finasteride (PROSCAR) 5 MG tablet Take 1 tablet Daily for Prostate  . fish oil-omega-3 fatty acids 1000 MG capsule Take 2 g by mouth daily.  . flecainide (TAMBOCOR) 100 MG tablet Take 3 tablets by mouth as needed for onset of afib  . fluorouracil (EFUDEX) 5 % cream Apply to suspect areas 2 x / day til skin raw  . GARLIC PO Take by mouth 2 (two) times daily.  . LUTEIN PO Take by mouth daily.  . pantoprazole (PROTONIX) 40 MG tablet TAKE 1 TABLET EVERY MORNING FOR ACID INDIGESTION & REFLUX  . rosuvastatin (CRESTOR) 40 MG  tablet Take 1 tablet Daily for Cholesterol  . TURMERIC PO Take by mouth.   No current facility-administered medications on file prior to visit.     Allergies: No Known Allergies   Medical History:  Past Medical History:  Diagnosis Date  . Anemia   . Arthritis   . Diverticulitis   . GERD (gastroesophageal reflux disease)   . Hyperlipidemia   . Other testicular hypofunction   . Paroxysmal atrial fibrillation (HCC)   . Sigmoid diverticulitis 05/21/2018   Family history- Reviewed and unchanged Social history- Reviewed and unchanged    Screening Tests: Immunization History  Administered Date(s) Administered  . Influenza Inj Mdck Quad Pf 05/17/2019  . Influenza Inj Mdck Quad With Preservative 05/31/2017, 05/12/2020  . Influenza Split 04/16/2013, 04/22/2014, 04/27/2015  . Influenza,inj,Quad PF,6+ Mos 05/17/2019  . Influenza-Unspecified 04/18/2016, 04/24/2018  . PFIZER SARS-COV-2 Vaccination 10/10/2019, 11/04/2019  . PPD Test 04/22/2014, 04/27/2015, 05/03/2016, 05/31/2017, 07/03/2018, 07/31/2019  . Pneumococcal Polysaccharide-23 04/12/2012  . Tdap 06/28/2011    Preventative care: Last colonoscopy: 2020,  Q7years, 2027 Due    Review of Systems:  Review of Systems  Constitutional: Negative for malaise/fatigue and weight loss.  HENT: Negative for hearing loss and tinnitus.   Eyes: Negative for blurred vision and double vision.  Respiratory: Negative for cough, shortness of breath and wheezing.   Cardiovascular: Negative for chest pain, palpitations, orthopnea, claudication and leg swelling.  Gastrointestinal: Negative for abdominal pain, blood in stool, constipation, diarrhea, heartburn, melena, nausea and vomiting.  Genitourinary: Negative.   Musculoskeletal: Negative for joint pain and myalgias.  Skin: Negative for rash.  Neurological: Negative for dizziness, tingling, sensory change, weakness and headaches.  Endo/Heme/Allergies: Negative for polydipsia.   Psychiatric/Behavioral: Negative.   All other systems reviewed and are negative.     Physical Exam: BP 116/72   Pulse 63   Temp (!) 97.5 F (36.4 C)   Wt 198 lb 9.6 oz (90.1 kg)   SpO2 97%   BMI 26.20 kg/m  Wt Readings from Last 3 Encounters:  05/12/20 198 lb 9.6 oz (90.1 kg)  04/14/20 196 lb (88.9 kg)  02/10/20 191 lb 12.8 oz (87 kg)   General Appearance: Well nourished, in no apparent distress. Eyes: PERRLA, EOMs, conjunctiva no swelling or erythema Sinuses: No Frontal/maxillary tenderness ENT/Mouth: Ext aud canals clear, TMs without erythema, bulging. No erythema, swelling, or exudate on post pharynx.  Tonsils not swollen or erythematous. Hearing normal.  Neck: Supple, thyroid normal.  Respiratory: Respiratory effort normal, BS equal bilaterally without fbrales, rhonchi, wheezing or stridor.  Cardio: RRR with no MRGs. Brisk peripheral pulses without edema.  Abdomen: Soft, + BS.  Non tender, no guarding, rebound, hernias, masses. Lymphatics: Non tender without lymphadenopathy.  Musculoskeletal: Full ROM, 5/5 strength, Normal gait Skin: Warm, dry without rashes; he has 5 scaly lesions with erythematous bases to 3 to RUE and 2 to LUE  Neuro: Cranial nerves intact. No cerebellar symptoms.  Psych: Awake and oriented X 3, normal affect, Insight and Judgment appropriate.    Garnet Sierras, Laqueta Jean, DNP [facility] 05/12/2020  3:25 PM

## 2020-05-12 ENCOUNTER — Ambulatory Visit: Payer: 59 | Admitting: Adult Health Nurse Practitioner

## 2020-05-12 ENCOUNTER — Encounter: Payer: Self-pay | Admitting: Adult Health Nurse Practitioner

## 2020-05-12 ENCOUNTER — Other Ambulatory Visit: Payer: Self-pay

## 2020-05-12 VITALS — BP 116/72 | HR 63 | Temp 97.5°F | Wt 198.6 lb

## 2020-05-12 DIAGNOSIS — I10 Essential (primary) hypertension: Secondary | ICD-10-CM | POA: Diagnosis not present

## 2020-05-12 DIAGNOSIS — Z23 Encounter for immunization: Secondary | ICD-10-CM

## 2020-05-12 DIAGNOSIS — I480 Paroxysmal atrial fibrillation: Secondary | ICD-10-CM

## 2020-05-12 DIAGNOSIS — R7309 Other abnormal glucose: Secondary | ICD-10-CM | POA: Diagnosis not present

## 2020-05-12 DIAGNOSIS — E782 Mixed hyperlipidemia: Secondary | ICD-10-CM | POA: Diagnosis not present

## 2020-05-12 DIAGNOSIS — K219 Gastro-esophageal reflux disease without esophagitis: Secondary | ICD-10-CM

## 2020-05-12 DIAGNOSIS — L570 Actinic keratosis: Secondary | ICD-10-CM | POA: Diagnosis not present

## 2020-05-12 DIAGNOSIS — Z6826 Body mass index (BMI) 26.0-26.9, adult: Secondary | ICD-10-CM

## 2020-05-12 DIAGNOSIS — B0229 Other postherpetic nervous system involvement: Secondary | ICD-10-CM

## 2020-05-12 DIAGNOSIS — E559 Vitamin D deficiency, unspecified: Secondary | ICD-10-CM

## 2020-05-12 DIAGNOSIS — Z79899 Other long term (current) drug therapy: Secondary | ICD-10-CM

## 2020-05-13 LAB — COMPLETE METABOLIC PANEL WITH GFR
AG Ratio: 1.8 (calc) (ref 1.0–2.5)
ALT: 24 U/L (ref 9–46)
AST: 20 U/L (ref 10–35)
Albumin: 4.4 g/dL (ref 3.6–5.1)
Alkaline phosphatase (APISO): 51 U/L (ref 35–144)
BUN: 14 mg/dL (ref 7–25)
CO2: 28 mmol/L (ref 20–32)
Calcium: 9.2 mg/dL (ref 8.6–10.3)
Chloride: 105 mmol/L (ref 98–110)
Creat: 0.91 mg/dL (ref 0.70–1.25)
GFR, Est African American: 105 mL/min/{1.73_m2} (ref >=60)
GFR, Est Non African American: 91 mL/min/{1.73_m2} (ref >=60)
Globulin: 2.4 g/dL (calc) (ref 1.9–3.7)
Glucose, Bld: 92 mg/dL (ref 65–99)
Potassium: 4.5 mmol/L (ref 3.5–5.3)
Sodium: 140 mmol/L (ref 135–146)
Total Bilirubin: 0.5 mg/dL (ref 0.2–1.2)
Total Protein: 6.8 g/dL (ref 6.1–8.1)

## 2020-05-13 LAB — CBC WITH DIFFERENTIAL/PLATELET
Absolute Monocytes: 351 cells/uL (ref 200–950)
Basophils Absolute: 81 cells/uL (ref 0–200)
Basophils Relative: 1.5 %
Eosinophils Absolute: 281 cells/uL (ref 15–500)
Eosinophils Relative: 5.2 %
HCT: 41.6 % (ref 38.5–50.0)
Hemoglobin: 14 g/dL (ref 13.2–17.1)
Lymphs Abs: 1739 cells/uL (ref 850–3900)
MCH: 30.2 pg (ref 27.0–33.0)
MCHC: 33.7 g/dL (ref 32.0–36.0)
MCV: 89.8 fL (ref 80.0–100.0)
MPV: 10.6 fL (ref 7.5–12.5)
Monocytes Relative: 6.5 %
Neutro Abs: 2948 cells/uL (ref 1500–7800)
Neutrophils Relative %: 54.6 %
Platelets: 255 10*3/uL (ref 140–400)
RBC: 4.63 10*6/uL (ref 4.20–5.80)
RDW: 13 % (ref 11.0–15.0)
Total Lymphocyte: 32.2 %
WBC: 5.4 10*3/uL (ref 3.8–10.8)

## 2020-05-13 LAB — LIPID PANEL
Cholesterol: 136 mg/dL (ref <200)
HDL: 45 mg/dL (ref >=40)
LDL Cholesterol (Calc): 63 mg/dL (calc)
Non-HDL Cholesterol (Calc): 91 mg/dL (calc) (ref <130)
Total CHOL/HDL Ratio: 3 (calc) (ref <5.0)
Triglycerides: 221 mg/dL — ABNORMAL HIGH (ref <150)

## 2020-07-04 ENCOUNTER — Other Ambulatory Visit: Payer: Self-pay | Admitting: Internal Medicine

## 2020-07-04 DIAGNOSIS — K219 Gastro-esophageal reflux disease without esophagitis: Secondary | ICD-10-CM

## 2020-07-10 ENCOUNTER — Other Ambulatory Visit: Payer: Self-pay | Admitting: Internal Medicine

## 2020-08-19 ENCOUNTER — Other Ambulatory Visit: Payer: Self-pay

## 2020-08-19 ENCOUNTER — Other Ambulatory Visit: Payer: Self-pay | Admitting: Internal Medicine

## 2020-08-19 ENCOUNTER — Ambulatory Visit: Payer: 59 | Admitting: Internal Medicine

## 2020-08-19 VITALS — BP 120/80 | HR 69 | Temp 97.0°F | Resp 16 | Ht 73.0 in | Wt 199.0 lb

## 2020-08-19 DIAGNOSIS — Z0000 Encounter for general adult medical examination without abnormal findings: Secondary | ICD-10-CM | POA: Diagnosis not present

## 2020-08-19 DIAGNOSIS — Z125 Encounter for screening for malignant neoplasm of prostate: Secondary | ICD-10-CM

## 2020-08-19 DIAGNOSIS — Z136 Encounter for screening for cardiovascular disorders: Secondary | ICD-10-CM | POA: Diagnosis not present

## 2020-08-19 DIAGNOSIS — Z111 Encounter for screening for respiratory tuberculosis: Secondary | ICD-10-CM | POA: Diagnosis not present

## 2020-08-19 DIAGNOSIS — E782 Mixed hyperlipidemia: Secondary | ICD-10-CM

## 2020-08-19 DIAGNOSIS — Z79899 Other long term (current) drug therapy: Secondary | ICD-10-CM

## 2020-08-19 DIAGNOSIS — Z0001 Encounter for general adult medical examination with abnormal findings: Secondary | ICD-10-CM

## 2020-08-19 DIAGNOSIS — I480 Paroxysmal atrial fibrillation: Secondary | ICD-10-CM

## 2020-08-19 DIAGNOSIS — I10 Essential (primary) hypertension: Secondary | ICD-10-CM

## 2020-08-19 DIAGNOSIS — Z8249 Family history of ischemic heart disease and other diseases of the circulatory system: Secondary | ICD-10-CM

## 2020-08-19 DIAGNOSIS — E349 Endocrine disorder, unspecified: Secondary | ICD-10-CM

## 2020-08-19 DIAGNOSIS — E559 Vitamin D deficiency, unspecified: Secondary | ICD-10-CM

## 2020-08-19 DIAGNOSIS — Z1211 Encounter for screening for malignant neoplasm of colon: Secondary | ICD-10-CM

## 2020-08-19 DIAGNOSIS — R7309 Other abnormal glucose: Secondary | ICD-10-CM

## 2020-08-19 DIAGNOSIS — R5383 Other fatigue: Secondary | ICD-10-CM

## 2020-08-19 NOTE — Progress Notes (Signed)
+  Annual  Screening/Preventative Visit  & Comprehensive Evaluation & Examination       This very nice 60 y.o.male presents for a Screening /Preventative Visit & comprehensive evaluation and management of multiple medical co-morbidities.  In 2008, patient had an episode of pAfib and was evaluated by Dr Johnsie Cancel who recommended prn "pill in pocket" rescue with Diltiazem and Flecanamide . Patient has been followed for HTN, HLD, Prediabetes and Vitamin D Deficiency. Patient's GERD is quiescent on prudent diet & his meds.      HTN predates circa 2008. Patient's BP has been controlled at home.  Today's BP is at goal - 120/80. Patient denies any cardiac symptoms as chest pain, palpitations, shortness of breath, dizziness or ankle swelling.      Patient's hyperlipidemia is controlled with diet and medications. Patient denies myalgias or other medication SE's. Last lipids were  Lab Results  Component Value Date   CHOL 136 05/12/2020   HDL 45 05/12/2020   LDLCALC 63 05/12/2020   TRIG 221 (H) 05/12/2020   CHOLHDL 3.0 05/12/2020        Patient has hx/o prediabetes (A1c 5.9%/2016) and patient denies reactive hypoglycemic symptoms, visual blurring, diabetic polys or paresthesias. Last A1c was normal & at goal:   Lab Results  Component Value Date   HGBA1C 5.4 02/10/2020         Finally, patient has history of Vitamin D Deficiency of  ("17"/2008)and last vitamin D was at goal:   Lab Results  Component Value Date   VD25OH 66 02/10/2020    Current Outpatient Medications on File Prior to Visit  Medication Sig  . VITAMIN C 500 mg tablet Take  daily.   Marland Kitchen aspirin 81 MG tablet Take  daily.  . CHELATED ZINC 50 mg Take   daily.  Marland Kitchen VITAMIN D 5,000 Units Take  daily.   . CVITAMIN B-12 tab Take 3,000 mcg   . famotidine  40 MG tablet Take  1 tablet Daily   . finasteride 5 MG tablet TAKE 1 TABLET DAILY   . fish oil-omega-3  1000 MG capsule Take 2 g daily.  . flecainide  100 MG tablet Take 3  tablets  as needed for onset of afib  . fluorouracil  5 % cream Apply to suspect areas 2 x / day til skin raw  . GARLIC  Take  2  times daily.  . LUTEIN PO Take  daily.  . pantoprazole  40 MG tablet TAKE 1 TABLET EVERY MORNING  . rosuvastatin 40 MG tablet Take 1 tablet Daily for Cholesterol  . TURMERIC  Take daily    No Known Allergies  Past Medical History:  Diagnosis Date  . Anemia   . Arthritis   . Diverticulitis   . GERD (gastroesophageal reflux disease)   . Hyperlipidemia   . Other testicular hypofunction   . Paroxysmal atrial fibrillation (HCC)   . Sigmoid diverticulitis 05/21/2018   Health Maintenance  Topic Date Due  . Hepatitis C Screening  Never done  . HIV Screening  Never done  . COVID-19 Vaccine (3 - Pfizer risk 4-dose series) 12/02/2019  . TETANUS/TDAP  06/27/2021  . COLONOSCOPY (Pts 45-49yrs Insurance coverage will need to be confirmed)  10/11/2025  . INFLUENZA VACCINE  Completed   Immunization History  Administered Date(s) Administered  . Influenza Inj Mdck Quad Pf 05/17/2019  . Influenza Inj Mdck Quad With Preservative 05/31/2017, 05/12/2020  . Influenza Split 04/16/2013, 04/22/2014, 04/27/2015  . Influenza,inj,Quad PF,6+ Mos 05/17/2019  .  Influenza-Unspecified 04/18/2016, 04/24/2018  . PFIZER(Purple Top)SARS-COV-2 Vaccination 10/10/2019, 11/04/2019  . PPD Test 04/22/2014, 04/27/2015, 05/03/2016, 05/31/2017, 07/03/2018, 07/31/2019  . Pneumococcal Polysaccharide-23 04/12/2012  . Tdap 06/28/2011   Last Colon - 10/12/2018 - Dr Ardis Hughs - Recc 7 yr f/u - due Apr 2027  Past Surgical History:  Procedure Laterality Date  . CHONDROPLASTY Left 09/15/2016   Procedure: CHONDROPLASTY;  Surgeon: Ninetta Lights, MD;  Location: [facility];  Service: Orthopedics;  Laterality: Left;  . COLONOSCOPY  last 06/15/2009  . KNEE ARTHROSCOPY W/ MENISCAL REPAIR  2009   right  . KNEE ARTHROSCOPY WITH MEDIAL MENISECTOMY Left 09/15/2016   Procedure: LEFT KNEE  ARTHROSCOPY CHONDROPLASTY  WITH MEDIAL MENISECTOMY;  Surgeon: Ninetta Lights, MD;  Location: [facility];  Service: Orthopedics;  Laterality: Left;  . PROXIMAL INTERPHALANGEAL FUSION (PIP) Left 05/14/2013   Procedure: RECONSTRUCTION BOUTONNIERE (LEFT SMALL FINGER) PINNING PROXIMAL INTERPHALANGEAL FUSION (PIP);  Surgeon: Wynonia Sours, MD;  Location: [facility];  Service: Orthopedics;  Laterality: Left;   Family History  Problem Relation Age of Onset  . Transient ischemic attack Mother   . Stroke Father   . Heart disease Father   . Hypertension Father   . Cancer Maternal Grandmother        colon   . Colon cancer Maternal Grandmother 26  . Stomach cancer Neg Hx   . Rectal cancer Neg Hx   . Esophageal cancer Neg Hx   . Liver cancer Neg Hx   . Colon polyps Neg Hx    Social History   Socioeconomic History  . Marital status: Married    Spouse name: Kermit Balo  . Number of children: 2  Occupational History  . Occupation: Development worker, international aid: [employer]  Tobacco Use  . Smoking status: Never Smoker  . Smokeless tobacco: Never Used  Vaping Use  . Vaping Use: Never used  Substance and Sexual Activity  . Alcohol use: No  . Drug use: No  . Sexual activity: Not on file  Social History Narrative   Works as a Insurance account manager for [employer] Constitutional: Denies fever, chills, weight loss/gain, headaches, insomnia,  night sweats or change in appetite. Does c/o fatigue. Eyes: Denies redness, blurred vision, diplopia, discharge, itchy or watery eyes.  ENT: Denies discharge, congestion, post nasal drip, epistaxis, sore throat, earache, hearing loss, dental pain, Tinnitus, Vertigo, Sinus pain or snoring.  Cardio: Denies chest pain, palpitations, irregular heartbeat, syncope, dyspnea, diaphoresis, orthopnea, PND, claudication or edema Respiratory: denies cough, dyspnea, DOE, pleurisy, hoarseness, laryngitis or wheezing.  Gastrointestinal: Denies dysphagia,  heartburn, reflux, water brash, pain, cramps, nausea, vomiting, bloating, diarrhea, constipation, hematemesis, melena, hematochezia, jaundice or hemorrhoids Genitourinary: Denies dysuria, frequency, urgency, nocturia, hesitancy, discharge, hematuria or flank pain Musculoskeletal: Denies arthralgia, myalgia, stiffness, Jt. Swelling, pain, limp or strain/sprain. Denies Falls. Skin: Denies puritis, rash, hives, warts, acne, eczema or change in skin lesion Neuro: No weakness, tremor, incoordination, spasms, paresthesia or pain Psychiatric: Denies confusion, memory loss or sensory loss. Denies Depression. Endocrine: Denies change in weight, skin, hair change, nocturia, and paresthesia, diabetic polys, visual blurring or hyper / hypo glycemic episodes.  Heme/Lymph: No excessive bleeding, bruising or enlarged lymph nodes.  Physical Exam  BP 120/80   Pulse 69   Temp (!) 97 F (36.1 C)   Resp 16   Ht 6\' 1"  (1.854 m)   Wt 199 lb (90.3 kg)   SpO2 99%   BMI 26.25  kg/m   General Appearance: Well nourished and well groomed and in no apparent distress.  Eyes: PERRLA, EOMs, conjunctiva no swelling or erythema, normal fundi and vessels. Sinuses: No frontal/maxillary tenderness ENT/Mouth: EACs patent / TMs  nl. Nares clear without erythema, swelling, mucoid exudates. Oral hygiene is good. No erythema, swelling, or exudate. Tongue normal, non-obstructing. Tonsils not swollen or erythematous. Hearing normal.  Neck: Supple, thyroid not palpable. No bruits, nodes or JVD. Respiratory: Respiratory effort normal.  BS equal and clear bilateral without rales, rhonci, wheezing or stridor. Cardio: Heart sounds are normal with regular rate and rhythm and no murmurs, rubs or gallops. Peripheral pulses are normal and equal bilaterally without edema. No aortic or femoral bruits. Chest: symmetric with normal excursions and percussion.  Abdomen: Soft, with Nl bowel sounds. Nontender, no guarding, rebound, hernias,  masses, or organomegaly.  Lymphatics: Non tender without lymphadenopathy.  Musculoskeletal: Full ROM all peripheral extremities, joint stability, 5/5 strength, and normal gait. Skin: Warm and dry without rashes, lesions, cyanosis, clubbing or  ecchymosis.  Neuro: Cranial nerves intact, reflexes equal bilaterally. Normal muscle tone, no cerebellar symptoms. Sensation intact.  Pysch: Alert and oriented X 3 with normal affect, insight and judgment appropriate.   Assessment and Plan  1. Annual Preventative/Screening Exam    2. Essential hypertension  - EKG 12-Lead - [place], RETROPERITNL ABD,  LTD - Urinalysis, Routine w reflex microscopic - Microalbumin / creatinine urine ratio - Magnesium - CBC with Differential/Platelet - COMPLETE METABOLIC PANEL WITH GFR - TSH  3. Hyperlipidemia, mixed  - EKG 12-Lead - [place], RETROPERITNL ABD,  LTD - Lipid panel - TSH  4. Abnormal glucose  - EKG 12-Lead - [place], RETROPERITNL ABD,  LTD - Hemoglobin A1c - Insulin, random  5. Vitamin D deficiency   6. Testosterone deficiency  - Testosterone  7. Paroxysmal atrial fibrillation (HCC)  - EKG 12-Lead - TSH  8. Screening for colorectal cancer  - POC Hemoccult Bld/Stl   9. Screening examination for pulmonary tuberculosis  - TB Skin Test  10. Screening for prostate cancer  - PSA  11. Screening for ischemic heart disease  - EKG 12-Lead  12. FHx: heart disease  - EKG 12-Lead - [place], RETROPERITNL ABD,  LTD  13. Screening for AAA (aortic abdominal aneurysm)  - [place], RETROPERITNL ABD,  LTD  14. Fatigue  - Iron,Total/Total Iron Binding Cap - Vitamin B12 - CBC with Differential/Platelet - TSH  15. Medication management  - Urinalysis, Routine w reflex microscopic - Microalbumin / creatinine urine ratio - CBC with Differential/Platelet - COMPLETE METABOLIC PANEL WITH GFR - Lipid panel - TSH - Hemoglobin A1c - VITAMIN D 25 Hydroxyl            Patient was counseled in prudent  diet, weight control to achieve/maintain BMI less than 25, BP monitoring, regular exercise and medications as discussed.  Discussed med effects and SE's. Routine screening labs and tests as requested with regular follow-up as recommended. Over 40 minutes of exam, counseling, chart review and high complex critical decision making was performed   Kirtland Bouchard, MD

## 2020-08-19 NOTE — Patient Instructions (Signed)

## 2020-08-20 LAB — CBC WITH DIFFERENTIAL/PLATELET
Absolute Monocytes: 551 cells/uL (ref 200–950)
Basophils Absolute: 88 cells/uL (ref 0–200)
Basophils Relative: 1.3 %
Eosinophils Absolute: 252 cells/uL (ref 15–500)
Eosinophils Relative: 3.7 %
HCT: 42.5 % (ref 38.5–50.0)
Hemoglobin: 14.7 g/dL (ref 13.2–17.1)
Lymphs Abs: 2040 cells/uL (ref 850–3900)
MCH: 30.9 pg (ref 27.0–33.0)
MCHC: 34.6 g/dL (ref 32.0–36.0)
MCV: 89.5 fL (ref 80.0–100.0)
MPV: 11 fL (ref 7.5–12.5)
Monocytes Relative: 8.1 %
Neutro Abs: 3869 cells/uL (ref 1500–7800)
Neutrophils Relative %: 56.9 %
Platelets: 228 10*3/uL (ref 140–400)
RBC: 4.75 10*6/uL (ref 4.20–5.80)
RDW: 12.1 % (ref 11.0–15.0)
Total Lymphocyte: 30 %
WBC: 6.8 10*3/uL (ref 3.8–10.8)

## 2020-08-20 LAB — COMPLETE METABOLIC PANEL WITH GFR
AG Ratio: 1.8 (calc) (ref 1.0–2.5)
ALT: 20 U/L (ref 9–46)
AST: 22 U/L (ref 10–35)
Albumin: 4.4 g/dL (ref 3.6–5.1)
Alkaline phosphatase (APISO): 47 U/L (ref 35–144)
BUN: 21 mg/dL (ref 7–25)
CO2: 27 mmol/L (ref 20–32)
Calcium: 9.2 mg/dL (ref 8.6–10.3)
Chloride: 103 mmol/L (ref 98–110)
Creat: 1.16 mg/dL (ref 0.70–1.25)
GFR, Est African American: 78 mL/min/{1.73_m2} (ref >=60)
GFR, Est Non African American: 68 mL/min/{1.73_m2} (ref >=60)
Globulin: 2.5 g/dL (calc) (ref 1.9–3.7)
Glucose, Bld: 86 mg/dL (ref 65–99)
Potassium: 4 mmol/L (ref 3.5–5.3)
Sodium: 139 mmol/L (ref 135–146)
Total Bilirubin: 0.5 mg/dL (ref 0.2–1.2)
Total Protein: 6.9 g/dL (ref 6.1–8.1)

## 2020-08-20 LAB — LIPID PANEL
Cholesterol: 154 mg/dL (ref <200)
HDL: 48 mg/dL (ref >=40)
LDL Cholesterol (Calc): 83 mg/dL (calc)
Non-HDL Cholesterol (Calc): 106 mg/dL (calc) (ref <130)
Total CHOL/HDL Ratio: 3.2 (calc) (ref <5.0)
Triglycerides: 133 mg/dL (ref <150)

## 2020-08-20 LAB — IRON, TOTAL/TOTAL IRON BINDING CAP
%SAT: 32 % (calc) (ref 20–48)
Iron: 97 ug/dL (ref 50–180)
TIBC: 305 mcg/dL (calc) (ref 250–425)

## 2020-08-20 LAB — URINALYSIS, ROUTINE W REFLEX MICROSCOPIC
Bilirubin Urine: NEGATIVE
Glucose, UA: NEGATIVE
Hgb urine dipstick: NEGATIVE
Ketones, ur: NEGATIVE
Leukocytes,Ua: NEGATIVE
Nitrite: NEGATIVE
Protein, ur: NEGATIVE
Specific Gravity, Urine: 1.036 — ABNORMAL HIGH (ref 1.001–1.03)
pH: <=5 (ref 5.0–8.0)

## 2020-08-20 LAB — VITAMIN D 25 HYDROXY (VIT D DEFICIENCY, FRACTURES): Vit D, 25-Hydroxy: 68 ng/mL (ref 30–100)

## 2020-08-20 LAB — TSH: TSH: 2.85 mIU/L (ref 0.40–4.50)

## 2020-08-20 LAB — HEMOGLOBIN A1C
Hgb A1c MFr Bld: 5.6 % of total Hgb (ref <5.7)
Mean Plasma Glucose: 114 mg/dL
eAG (mmol/L): 6.3 mmol/L

## 2020-08-20 LAB — VITAMIN B12: Vitamin B-12: 1424 pg/mL — ABNORMAL HIGH (ref 200–1100)

## 2020-08-20 LAB — MICROALBUMIN / CREATININE URINE RATIO
Creatinine, Urine: 293 mg/dL (ref 20–320)
Microalb Creat Ratio: 3 mcg/mg creat (ref <30)
Microalb, Ur: 0.9 mg/dL

## 2020-08-20 LAB — TESTOSTERONE: Testosterone: 429 ng/dL (ref 250–827)

## 2020-08-20 LAB — INSULIN, RANDOM: Insulin: 4.8 u[IU]/mL

## 2020-08-20 LAB — PSA: PSA: 0.96 ng/mL (ref <=4.0)

## 2020-08-20 LAB — MAGNESIUM: Magnesium: 2.1 mg/dL (ref 1.5–2.5)

## 2020-08-20 NOTE — Progress Notes (Signed)
========================================================== -   Test results slightly outside the reference range are not unusual. If there is anything important, I will review this with you,  otherwise it is considered normal test values.  If you have further questions,  please do not hesitate to contact me at the office or via My Chart.  ========================================================== ==========================================================  -  Total Chol = 154   and    LDL Chol = 83   - Both  Excellent   - Very low risk for Heart Attack  / Stroke - Great !  ========================================================== ==========================================================  - A1c - Normal - Great - No Diabetes  ! ========================================================== ==========================================================  - Vitamin D = 68 - Excellent  ========================================================== ==========================================================  - Iron Levels  & Vitamin B12 levels  - All Normal & OK  ========================================================== ==========================================================  - Testosterone level - Normal & OK  ========================================================== ==========================================================  - PSA - Low - Great  ========================================================== ==========================================================  - All Else - CBC - Kidneys - Electrolytes - Liver - Magnesium & Thyroid    - all  Normal / OK ===========================================================  ==========================================================  - Keep up the [place] Work    ! ========================================================== ==========================================================

## 2020-08-22 ENCOUNTER — Encounter: Payer: Self-pay | Admitting: Internal Medicine

## 2020-08-24 LAB — TB SKIN TEST
Induration: 0 mm
TB Skin Test: NEGATIVE

## 2021-02-09 ENCOUNTER — Other Ambulatory Visit: Payer: Self-pay | Admitting: Internal Medicine

## 2021-02-09 DIAGNOSIS — E782 Mixed hyperlipidemia: Secondary | ICD-10-CM

## 2021-02-09 MED ORDER — ROSUVASTATIN CALCIUM 40 MG PO TABS: ORAL_TABLET | ORAL | 3 refills | Status: DC

## 2021-02-17 NOTE — Progress Notes (Signed)
FOLLOW UP  Assessment and Plan:   Paroxysmal a fib Rate controlled today; avoid triggers; flecainide PRN recurrence Continue with current medications for rate control, ASA Followed by Dr. Johnsie Cancel  Hypertension Well controlled with current medications  Monitor blood pressure at home; patient to call if consistently greater than 130/80 Continue DASH diet.   Reminder to go to the ER if any CP, SOB, nausea, dizziness, severe HA, changes vision/speech, left arm numbness and tingling and jaw pain.  Cholesterol On rosuvastatin 20 mg and tolerating well  Continue low cholesterol diet and exercise.  Check lipid panel.   Other abnormal glucose Continue diet and exercise.  Perform daily foot/skin check, notify office of any concerning changes.  Check CMP/GFR  BMI 24 Continue to recommend diet heavy in fruits and veggies and low in animal meats, cheeses, and dairy products, appropriate calorie intake Discuss exercise recommendations routinely Continue to monitor weight at each visit  Vitamin D Def At goal at last visit; continue supplementation for goal of 60-100 Defer Vit D level  GERD Well managed on current medications; doing well with PRN H2i Discussed diet, avoiding triggers and other lifestyle changes  Left thumb pain Fairly benign exam; declines xray at this time Prefers to avoid more meds; discussed topical voltaren/aspercreme Follow up if getting worse  Continue diet and meds as discussed. Further disposition pending results of labs. Discussed med's effects and SE's.   Over 30 minutes of exam, counseling, chart review, and critical decision making was performed.   Future Appointments  Date Time Provider [place]  09/06/2021 11:00 AM [name], MD [facility] None    ----------------------------------------------------------------------------------------------------------------------  HPI 60 y.o. male  presents for 6 month follow up on hypertension,  cholesterol, glucose management, weight and vitamin D deficiency.  He retired in March, working on his garden. Much less stressed -   He has persistent 1 year of left thumb base discomfort, intermittent, may have catching sensation. Hasn't tried anything, declines xray.   he has a diagnosis of GERD which is currently managed by PRN famotidine. Much improved since retirement.  Had NSAID gastritis with anemia in 09/2016  BMI is Body mass index is 24.94 kg/m., he has been working on diet and exercise. He has set a goal of 185 lb. He has been walking 3 miles most days. He has increased fluid intake. Generally making better dietary.  Wt Readings from Last 3 Encounters:  02/18/21 189 lb (85.7 kg)  08/19/20 199 lb (90.3 kg)  05/12/20 198 lb 9.6 oz (90.1 kg)   Patient has hx/o pAfib (CHADsVasc = 0) since 2012 and in recent years transitioned to flecainide PRN plan and is doing well, had mild episode recently while after he became dehydrated and quickly drank 2 bottles of water, quickly resolved with flecainide.  His blood pressure has been controlled at home, today their BP is BP: 116/82  He does workout. He denies chest pain, shortness of breath, dizziness.   He is on cholesterol medication (rosuvastatin 20 mg daily, doing cash pay, get 40 mg tabs) and denies myalgias. His LDL cholesterol is at goal, trigs mildly elevated. The cholesterol last visit was:   Lab Results  Component Value Date   CHOL 154 08/19/2020   HDL 48 08/19/2020   LDLCALC 83 08/19/2020   TRIG 133 08/19/2020   CHOLHDL 3.2 08/19/2020    He has been working on diet and exercise for glucose management, and denies foot ulcerations, increased appetite, nausea, paresthesia of the feet, polydipsia, polyuria,  visual disturbances, vomiting and weight loss. Last A1C in the office was:  Lab Results  Component Value Date   HGBA1C 5.6 08/19/2020   Patient is on Vitamin D supplement   Lab Results  Component Value Date   VD25OH 27  08/19/2020     He has a history of testosterone deficiency and is on zinc supplement and feels doing well with this.  Lab Results  Component Value Date   TESTOSTERONE 429 08/19/2020   He reports history of BPH, improved and off of finasteride per patien tpreference. Denies LUTs, gets up once per night.  Lab Results  Component Value Date   PSA 0.96 08/19/2020   PSA 0.9 07/31/2019   PSA 1.0 05/31/2017     Current Medications:  Current Outpatient Medications on File Prior to Visit  Medication Sig   Ascorbic Acid (VITAMIN C) 1000 MG tablet Take 500 mg by mouth daily.    aspirin 81 MG tablet Take 81 mg by mouth daily.   CHELATED ZINC PO Take 50 mg by mouth daily.   cholecalciferol (VITAMIN D) 1000 UNITS tablet Take 5,000 Units by mouth daily.    Cyanocobalamin (VITAMIN B-12 PO) Take 3,000 mcg by mouth.   fish oil-omega-3 fatty acids 1000 MG capsule Take 2 g by mouth daily.   flecainide (TAMBOCOR) 100 MG tablet Take 3 tablets by mouth as needed for onset of afib   GARLIC PO Take by mouth 2 (two) times daily.   LUTEIN PO Take by mouth daily.   rosuvastatin (CRESTOR) 40 MG tablet Take  1 tablet  Daily  for Cholesterol  (Dx:  e 78.2)   TURMERIC PO Take by mouth.   famotidine (PEPCID) 40 MG tablet Take       1 tablet       Daily       to Prevent Heartburn & Indigestion (Patient not taking: Reported on 02/18/2021)   pantoprazole (PROTONIX) 40 MG tablet TAKE 1 TABLET EVERY MORNING FOR ACID INDIGESTION & REFLUX (Patient not taking: Reported on 02/18/2021)   No current facility-administered medications on file prior to visit.     Allergies: No Known Allergies   Medical History:  Past Medical History:  Diagnosis Date   Anemia    Arthritis    Diverticulitis    GERD (gastroesophageal reflux disease)    Hyperlipidemia    Other testicular hypofunction    Paroxysmal atrial fibrillation ([place])    Sigmoid diverticulitis 05/21/2018   Family history- Reviewed and unchanged Social history-  Reviewed and unchanged   Review of Systems:  Review of Systems  Constitutional:  Negative for malaise/fatigue and weight loss.  HENT:  Negative for hearing loss and tinnitus.   Eyes:  Negative for blurred vision and double vision.  Respiratory:  Negative for cough, shortness of breath and wheezing.   Cardiovascular:  Negative for chest pain, palpitations, orthopnea, claudication and leg swelling.  Gastrointestinal:  Negative for abdominal pain, blood in stool, constipation, diarrhea, heartburn, melena, nausea and vomiting.  Genitourinary: Negative.   Musculoskeletal:  Positive for back pain (lumbar, left, intermittent, mild) and joint pain (left thumb, intermittent). Negative for myalgias.  Skin:  Negative for rash.  Neurological:  Negative for dizziness, tingling, sensory change, weakness and headaches.  Endo/Heme/Allergies:  Negative for polydipsia.  Psychiatric/Behavioral: Negative.    All other systems reviewed and are negative.    Physical Exam: BP 116/82   Pulse 73   Temp (!) 97.2 F (36.2 C)   Wt 189 lb (85.7  kg)   SpO2 97%   BMI 24.94 kg/m  Wt Readings from Last 3 Encounters:  02/18/21 189 lb (85.7 kg)  08/19/20 199 lb (90.3 kg)  05/12/20 198 lb 9.6 oz (90.1 kg)   General Appearance: Well nourished, in no apparent distress. Eyes: PERRLA, EOMs, conjunctiva no swelling or erythema Sinuses: No Frontal/maxillary tenderness ENT/Mouth: Ext aud canals clear, TMs without erythema, bulging. No erythema, swelling, or exudate on post pharynx.  Tonsils not swollen or erythematous. Hearing normal.  Neck: Supple, thyroid normal.  Respiratory: Respiratory effort normal, BS equal bilaterally without rales, rhonchi, wheezing or stridor.  Cardio: RRR with no MRGs. Brisk peripheral pulses without edema.  Abdomen: Soft, + BS.  Non tender, no guarding, rebound, hernias, masses. Lymphatics: Non tender without lymphadenopathy.  Musculoskeletal: Full ROM, 5/5 strength, Normal gait Skin:  Warm, dry without rashes. Neuro: Cranial nerves intact. No cerebellar symptoms.  Psych: Awake and oriented X 3, normal affect, Insight and Judgment appropriate.    Izora Ribas, NP 9:48 AM [facility]

## 2021-02-18 ENCOUNTER — Encounter: Payer: Self-pay | Admitting: Adult Health

## 2021-02-18 ENCOUNTER — Other Ambulatory Visit: Payer: Self-pay

## 2021-02-18 ENCOUNTER — Ambulatory Visit (INDEPENDENT_AMBULATORY_CARE_PROVIDER_SITE_OTHER): Payer: 59 | Admitting: Adult Health

## 2021-02-18 VITALS — BP 116/82 | HR 73 | Temp 97.2°F | Wt 189.0 lb

## 2021-02-18 DIAGNOSIS — I10 Essential (primary) hypertension: Secondary | ICD-10-CM | POA: Diagnosis not present

## 2021-02-18 DIAGNOSIS — Z6826 Body mass index (BMI) 26.0-26.9, adult: Secondary | ICD-10-CM

## 2021-02-18 DIAGNOSIS — M79645 Pain in left finger(s): Secondary | ICD-10-CM

## 2021-02-18 DIAGNOSIS — K219 Gastro-esophageal reflux disease without esophagitis: Secondary | ICD-10-CM | POA: Diagnosis not present

## 2021-02-18 DIAGNOSIS — G8929 Other chronic pain: Secondary | ICD-10-CM

## 2021-02-18 DIAGNOSIS — I480 Paroxysmal atrial fibrillation: Secondary | ICD-10-CM

## 2021-02-18 DIAGNOSIS — Z79899 Other long term (current) drug therapy: Secondary | ICD-10-CM

## 2021-02-18 DIAGNOSIS — E782 Mixed hyperlipidemia: Secondary | ICD-10-CM

## 2021-02-18 DIAGNOSIS — E559 Vitamin D deficiency, unspecified: Secondary | ICD-10-CM

## 2021-02-18 DIAGNOSIS — R7309 Other abnormal glucose: Secondary | ICD-10-CM

## 2021-02-18 NOTE — Patient Instructions (Addendum)
Could try topicals - voltaren/diclofenac or aspercreme - 4 times a day     Arthritis Arthritis means joint pain. It can also mean joint disease. A joint is a placewhere bones come together. There are more than 100 types of arthritis. What are the causes? This condition may be caused by: Wear and tear of a joint. This is the most common cause. A lot of acid in the blood, which leads to pain in the joint (gout). Pain and swelling (inflammation) in a joint. Infection of a joint. Injuries in the joint. A reaction to medicines (allergy). In some cases, the cause may not be known. What are the signs or symptoms? Symptoms of this condition include: Redness at a joint. Swelling at a joint. Stiffness at a joint. Warmth coming from the joint. A fever. A feeling of being sick. How is this treated? This condition may be treated with: Treating the cause, if it is known. Rest. Raising (elevating) the joint. Putting cold or hot packs on the joint. Medicines to treat symptoms and reduce pain and swelling. Shots of medicines (cortisone) into the joint. You may also be told to make changes in your life, such as doing exercises andlosing weight. Follow these instructions at home: Medicines Take over-the-counter and prescription medicines only as told by your doctor. Do not take aspirin for pain if your doctor says that you may have gout. Activity Rest your joint if your doctor tells you to. Avoid activities that make the pain worse. Exercise your joint regularly as told by your doctor. Try doing exercises like: Swimming. Water aerobics. Biking. Walking. Managing pain, stiffness, and swelling     If told, put ice on the affected area. Put ice in a plastic bag. Place a towel between your skin and the bag. Leave the ice on for 20 minutes, 2-3 times per day. If your joint is swollen, raise (elevate) it above the level of your heart if told by your doctor. If your joint feels stiff  in the morning, try taking a warm shower. If told, put heat on the affected area. Do this as often as told by your doctor. Use the heat source that your doctor recommends, such as a moist heat pack or a heating pad. If you have diabetes, do not apply heat without asking your doctor. To apply heat: Place a towel between your skin and the heat source. Leave the heat on for 20-30 minutes. Remove the heat if your skin turns bright red. This is very important if you are unable to feel pain, heat, or cold. You may have a greater risk of getting burned. General instructions Do not use any products that contain nicotine or tobacco, such as cigarettes, e-cigarettes, and chewing tobacco. If you need help quitting, ask your doctor. Keep all follow-up visits as told by your doctor. This is important. Contact a doctor if: The pain gets worse. You have a fever. Get help right away if: You have very bad pain in your joint. You have swelling in your joint. Your joint is red. Many joints become painful and swollen. You have very bad back pain. Your leg is very weak. You cannot control your pee (urine) or poop (stool). Summary Arthritis means joint pain. It can also mean joint disease. A joint is a place where bones come together. The most common cause of this condition is wear and tear of a joint. Symptoms of this condition include redness, swelling, or stiffness of the joint. This condition is  treated with rest, raising the joint, medicines, and putting cold or hot packs on the joint. Follow your doctor's instructions about medicines, activity, exercises, and other home care treatments. This information is not intended to replace advice given to you by your health care provider. Make sure you discuss any questions you have with your healthcare provider. Document Revised: 06/18/2018 Document Reviewed: 06/18/2018 Elsevier Patient Education  2022 [employer].

## 2021-02-19 ENCOUNTER — Other Ambulatory Visit: Payer: Self-pay | Admitting: Adult Health

## 2021-02-19 DIAGNOSIS — R7989 Other specified abnormal findings of blood chemistry: Secondary | ICD-10-CM

## 2021-02-19 LAB — CBC WITH DIFFERENTIAL/PLATELET
Absolute Monocytes: 413 cells/uL (ref 200–950)
Basophils Absolute: 72 cells/uL (ref 0–200)
Basophils Relative: 1.3 %
Eosinophils Absolute: 440 cells/uL (ref 15–500)
Eosinophils Relative: 8 %
HCT: 46 % (ref 38.5–50.0)
Hemoglobin: 15.3 g/dL (ref 13.2–17.1)
Lymphs Abs: 1601 cells/uL (ref 850–3900)
MCH: 29.6 pg (ref 27.0–33.0)
MCHC: 33.3 g/dL (ref 32.0–36.0)
MCV: 89 fL (ref 80.0–100.0)
MPV: 10.7 fL (ref 7.5–12.5)
Monocytes Relative: 7.5 %
Neutro Abs: 2976 cells/uL (ref 1500–7800)
Neutrophils Relative %: 54.1 %
Platelets: 229 10*3/uL (ref 140–400)
RBC: 5.17 10*6/uL (ref 4.20–5.80)
RDW: 12.5 % (ref 11.0–15.0)
Total Lymphocyte: 29.1 %
WBC: 5.5 10*3/uL (ref 3.8–10.8)

## 2021-02-19 LAB — COMPLETE METABOLIC PANEL WITH GFR
AG Ratio: 1.7 (calc) (ref 1.0–2.5)
ALT: 19 U/L (ref 9–46)
AST: 17 U/L (ref 10–35)
Albumin: 4.4 g/dL (ref 3.6–5.1)
Alkaline phosphatase (APISO): 50 U/L (ref 35–144)
BUN: 21 mg/dL (ref 7–25)
CO2: 25 mmol/L (ref 20–32)
Calcium: 9.2 mg/dL (ref 8.6–10.3)
Chloride: 105 mmol/L (ref 98–110)
Creat: 0.91 mg/dL (ref 0.70–1.35)
Globulin: 2.6 g/dL (calc) (ref 1.9–3.7)
Glucose, Bld: 90 mg/dL (ref 65–99)
Potassium: 4.3 mmol/L (ref 3.5–5.3)
Sodium: 138 mmol/L (ref 135–146)
Total Bilirubin: 0.5 mg/dL (ref 0.2–1.2)
Total Protein: 7 g/dL (ref 6.1–8.1)
eGFR: 96 mL/min/{1.73_m2} (ref >=60)

## 2021-02-19 LAB — LIPID PANEL
Cholesterol: 142 mg/dL (ref <200)
HDL: 48 mg/dL (ref >=40)
LDL Cholesterol (Calc): 76 mg/dL (calc)
Non-HDL Cholesterol (Calc): 94 mg/dL (calc) (ref <130)
Total CHOL/HDL Ratio: 3 (calc) (ref <5.0)
Triglycerides: 101 mg/dL (ref <150)

## 2021-02-19 LAB — TSH: TSH: 4.52 mIU/L — ABNORMAL HIGH (ref 0.40–4.50)

## 2021-05-28 ENCOUNTER — Other Ambulatory Visit: Payer: Self-pay | Admitting: Internal Medicine

## 2021-05-28 MED ORDER — FLECAINIDE ACETATE 100 MG PO TABS: ORAL_TABLET | ORAL | 3 refills | Status: DC

## 2021-06-03 ENCOUNTER — Encounter: Payer: Self-pay | Admitting: Internal Medicine

## 2021-08-31 ENCOUNTER — Encounter: Payer: 59 | Admitting: Internal Medicine

## 2021-09-05 NOTE — Progress Notes (Signed)
Annual  Screening/Preventative Visit  & Comprehensive Evaluation & Examination  Future Appointments  Date Time Provider Department  09/06/2021 11:00 AM [name], MD [facility]  09/12/2022 11:00 AM [name], MD [facility]            This very nice 61 y.o.  MWM presents for a Screening /Preventative Visit & comprehensive evaluation and management of multiple medical co-morbidities.  Patient has been followed for HTN, HLD, Prediabetes and Vitamin D Deficiency. Patient has hx/o GERD controlled by diet .        HTN predates since 2008. Patient's BP has been controlled at home.  Today's BP is at goal - 122/82. Patient has hx/o pAfib predating from 2008 - seen by Dr Johnsie Cancel & managed with "pill in pocket".   Patient denies any cardiac symptoms as chest pain, palpitations, shortness of breath, dizziness or ankle swelling.       Patient's hyperlipidemia is controlled with diet and rosuvastatin. Patient denies myalgias or other medication SE's. Last lipids were at goal :  Lab Results  Component Value Date   CHOL 142 02/18/2021   HDL 48 02/18/2021   LDLCALC 76 02/18/2021   TRIG 101 02/18/2021   CHOLHDL 3.0 02/18/2021         Patient has hx/o prediabetes (A1c 5.9% /2016)  and patient denies reactive hypoglycemic symptoms, visual blurring, diabetic polys or paresthesias. Last A1c was at goal :   Lab Results  Component Value Date   HGBA1C 5.6 08/19/2020          Finally, patient has history of Vitamin D Deficiency ("17" /2008) and last vitamin D was at goal :   Lab Results  Component Value Date   VD25OH 68 08/19/2020     Current Outpatient Medications on File Prior to Visit  Medication Sig   Ascorbic Acid (VITAMIN C) 1000 MG tablet Take  daily.    aspirin 81 MG tablet Take  daily.   CHELATED ZINC PO Take 5 daily.   VITAMIN D 5,000 u Take 5,000 Units daily.    VITAMIN B-12 tab 3,000 mcg   Take daily   fish oil-omega-3 fatty acids 1000 MG capsule Take 2 g  daily.    flecainide (TAMBOCOR) 100 MG tablet Take 3 tablets as needed for onset of afib   GARLIC PO Take 2 times daily.   LUTEIN PO Take  daily.   rosuvastatin  40 MG tablet Take  1 tablet  Daily  for Cholesterol  (Dx:  e 78.2)   TURMERIC  Take daily    No Known Allergies   Past Medical History:  Diagnosis Date   Anemia    Arthritis    Diverticulitis    GERD (gastroesophageal reflux disease)    Hyperlipidemia    Other testicular hypofunction    Paroxysmal atrial fibrillation ([place])    Sigmoid diverticulitis 05/21/2018     Health Maintenance  Topic Date Due   HIV Screening  Never done   Hepatitis C Screening  Never done   Zoster Vaccines- Shingrix (1 of 2) Never done   COVID-19 Vaccine (4 - Booster for [place] series) 08/20/2020   INFLUENZA VACCINE  02/22/2021   TETANUS/TDAP  06/27/2021   COLONOSCOPY (Pts 45-80yrs Insurance coverage will need to be confirmed)  10/11/2025   HPV VACCINES  Aged Out     Immunization History  Administered Date(s) Administered   Influenza Inj Mdck Quad 05/17/2019   Influenza Inj Mdck Quad 05/31/2017, 05/12/2020   Influenza  04/16/2013,  04/22/2014, 04/27/2015   Influenza,inj,Quad  05/17/2019   Influenza- 04/18/2016, 04/24/2018   PFIZER(Purple Top)SARS-COV-2 Vaccination 10/10/2019, 11/04/2019, 06/25/2020   PPD Test 04/22/2014, 04/27/2015, 05/03/2016, 05/31/2017, 07/03/2018, 07/31/2019, 08/19/2020   Pneumococcal Polysaccharide-23 04/12/2012   Tdap 06/28/2011    Last Colon - 10/12/2018 - Dr Ardis Hughs - Recc 7 yr f/u - due Apr 2027  Past Surgical History:  Procedure Laterality Date   CHONDROPLASTY Left 09/15/2016   Procedure: CHONDROPLASTY;  Surgeon: Ninetta Lights, MD;  Location: [place];  Service: Orthopedics;  Laterality: Left;   COLONOSCOPY  last 06/15/2009   KNEE ARTHROSCOPY W/ MENISCAL REPAIR  2009   right   KNEE ARTHROSCOPY WITH MEDIAL MENISECTOMY Left 09/15/2016   Procedure: LEFT KNEE ARTHROSCOPY CHONDROPLASTY  WITH MEDIAL  MENISECTOMY;  Ninetta Lights, MD   PROXIMAL INTERPHALANGEAL FUSION (PIP) Left 05/14/2013   Procedure: RECONSTRUCTION BOUTONNIERE (LEFT SMALL FINGER) PINNING PROXIMAL INTERPHALANGEAL FUSION (PIP);  Surgeon: Wynonia Sours, MD     Family History  Problem Relation Age of Onset   Transient ischemic attack Mother    Stroke Father    Heart disease Father    Hypertension Father    Cancer Maternal Grandmother        colon    Colon cancer Maternal Grandmother 37   Stomach cancer Neg Hx    Rectal cancer Neg Hx    Esophageal cancer Neg Hx    Liver cancer Neg Hx    Colon polyps Neg Hx      Social History   Tobacco Use   Smoking status: Never   Smokeless tobacco: Never  Vaping Use   Vaping Use: Never used  Substance Use Topics   Alcohol use: No   Drug use: No      ROS Constitutional: Denies fever, chills, weight loss/gain, headaches, insomnia,  night sweats or change in appetite. Does c/o fatigue. Eyes: Denies redness, blurred vision, diplopia, discharge, itchy or watery eyes.  ENT: Denies discharge, congestion, post nasal drip, epistaxis, sore throat, earache, hearing loss, dental pain, Tinnitus, Vertigo, Sinus pain or snoring.  Cardio: Denies chest pain, palpitations, irregular heartbeat, syncope, dyspnea, diaphoresis, orthopnea, PND, claudication or edema Respiratory: denies cough, dyspnea, DOE, pleurisy, hoarseness, laryngitis or wheezing.  Gastrointestinal: Denies dysphagia, heartburn, reflux, water brash, pain, cramps, nausea, vomiting, bloating, diarrhea, constipation, hematemesis, melena, hematochezia, jaundice or hemorrhoids Genitourinary: Denies dysuria, frequency, urgency, nocturia, hesitancy, discharge, hematuria or flank pain Musculoskeletal: Denies arthralgia, myalgia, stiffness, Jt. Swelling, pain, limp or strain/sprain. Denies Falls. Skin: Denies puritis, rash, hives, warts, acne, eczema or change in skin lesion Neuro: No weakness, tremor, incoordination, spasms,  paresthesia or pain Psychiatric: Denies confusion, memory loss or sensory loss. Denies Depression. Endocrine: Denies change in weight, skin, hair change, nocturia, and paresthesia, diabetic polys, visual blurring or hyper / hypo glycemic episodes.  Heme/Lymph: No excessive bleeding, bruising or enlarged lymph nodes.   Physical Exam  BP 122/82    Pulse 60    Temp 97.9 F (36.6 C)    Resp 16    Ht 6\' 1"  (1.854 m)    Wt 194 lb 3.2 oz (88.1 kg)    SpO2 96%    BMI 25.62 kg/m   General Appearance: Well nourished and well groomed and in no apparent distress.  Eyes: PERRLA, EOMs, conjunctiva no swelling or erythema, normal fundi and vessels. Sinuses: No frontal/maxillary tenderness ENT/Mouth: EACs patent / TMs  nl. Nares clear without erythema, swelling, mucoid exudates. Oral hygiene is good. No erythema, swelling, or exudate.  Tongue normal, non-obstructing. Tonsils not swollen or erythematous. Hearing normal.  Neck: Supple, thyroid not palpable. No bruits, nodes or JVD. Respiratory: Respiratory effort normal.  BS equal and clear bilateral without rales, rhonci, wheezing or stridor. Cardio: Heart sounds are normal with regular rate and rhythm and no murmurs, rubs or gallops. Peripheral pulses are normal and equal bilaterally without edema. No aortic or femoral bruits. Chest: symmetric with normal excursions and percussion.  Abdomen: Soft, with Nl bowel sounds. Nontender, no guarding, rebound, hernias, masses, or organomegaly.  Lymphatics: Non tender without lymphadenopathy.  Musculoskeletal: Full ROM all peripheral extremities, joint stability, 5/5 strength, and normal gait. Skin: Warm and dry without rashes, lesions, cyanosis, clubbing or  ecchymosis.  Neuro: Cranial nerves intact, reflexes equal bilaterally. Normal muscle tone, no cerebellar symptoms. Sensation intact.  Pysch: Alert and oriented X 3 with normal affect, insight and judgment appropriate.   Assessment and Plan  1. Annual  Preventative/Screening Exam    2. Essential hypertension  - EKG 12-Lead - [place], RETROPERITNL ABD,  LTD - Urinalysis, Routine w reflex microscopic - Microalbumin / creatinine urine ratio - CBC with Differential/Platelet - COMPLETE METABOLIC PANEL WITH GFR - Magnesium - TSH  3. Hyperlipidemia, mixed  - EKG 12-Lead - [place], RETROPERITNL ABD,  LTD - Lipid panel - TSH  4. Abnormal glucose  - EKG 12-Lead - [place], RETROPERITNL ABD,  LTD - Hemoglobin A1c - Insulin, random  5. Vitamin D deficiency  - VITAMIN D 25 Hydroxy   6. Paroxysmal atrial fibrillation ([facility])  - EKG 12-Lead - TSH  7. Testosterone deficiency  - Testosterone  8. Gastroesophageal reflux disease  - CBC with Differential/Platelet  9. Screening for colorectal cancer  - POC Hemoccult Bld/Stl   10. Screening examination for pulmonary tuberculosis  - TB Skin Test  11. Screening for prostate cancer  - PSA  12. Screening for ischemic heart disease  - EKG 12-Lead  13. FHx: heart disease  - EKG 12-Lead - [place], RETROPERITNL ABD,  LTD  14. Screening for AAA (aortic abdominal aneurysm)  - [place], RETROPERITNL ABD,  LTD  15. Fatigue, unspecified type  - Iron, Total/Total Iron Binding Cap - Vitamin B12 - Testosterone - CBC with Differential/Platelet - TSH  16. Medication management  - Urinalysis, Routine w reflex microscopic - Microalbumin / creatinine urine ratio - Magnesium - Lipid panel - TSH - Hemoglobin A1c - Insulin, random - VITAMIN D 25 Hydroxy           Patient was counseled in prudent diet, weight control to achieve/maintain BMI less than 25, BP monitoring, regular exercise and medications as discussed.  Discussed med effects and SE's. Routine screening labs and tests as requested with regular follow-up as recommended. Over 40 minutes of exam, counseling, chart review and high complex critical decision making was performed   Kirtland Bouchard, MD

## 2021-09-05 NOTE — Patient Instructions (Signed)

## 2021-09-06 ENCOUNTER — Encounter: Payer: Self-pay | Admitting: Internal Medicine

## 2021-09-06 ENCOUNTER — Other Ambulatory Visit: Payer: Self-pay

## 2021-09-06 ENCOUNTER — Ambulatory Visit (INDEPENDENT_AMBULATORY_CARE_PROVIDER_SITE_OTHER): Payer: 59 | Admitting: Internal Medicine

## 2021-09-06 VITALS — BP 122/82 | HR 60 | Temp 97.9°F | Resp 16 | Ht 73.0 in | Wt 194.2 lb

## 2021-09-06 DIAGNOSIS — Z125 Encounter for screening for malignant neoplasm of prostate: Secondary | ICD-10-CM

## 2021-09-06 DIAGNOSIS — Z0001 Encounter for general adult medical examination with abnormal findings: Secondary | ICD-10-CM

## 2021-09-06 DIAGNOSIS — I480 Paroxysmal atrial fibrillation: Secondary | ICD-10-CM

## 2021-09-06 DIAGNOSIS — R7309 Other abnormal glucose: Secondary | ICD-10-CM

## 2021-09-06 DIAGNOSIS — Z111 Encounter for screening for respiratory tuberculosis: Secondary | ICD-10-CM | POA: Diagnosis not present

## 2021-09-06 DIAGNOSIS — K219 Gastro-esophageal reflux disease without esophagitis: Secondary | ICD-10-CM

## 2021-09-06 DIAGNOSIS — Z8249 Family history of ischemic heart disease and other diseases of the circulatory system: Secondary | ICD-10-CM

## 2021-09-06 DIAGNOSIS — I10 Essential (primary) hypertension: Secondary | ICD-10-CM

## 2021-09-06 DIAGNOSIS — Z0000 Encounter for general adult medical examination without abnormal findings: Secondary | ICD-10-CM

## 2021-09-06 DIAGNOSIS — E559 Vitamin D deficiency, unspecified: Secondary | ICD-10-CM

## 2021-09-06 DIAGNOSIS — Z79899 Other long term (current) drug therapy: Secondary | ICD-10-CM

## 2021-09-06 DIAGNOSIS — Z1211 Encounter for screening for malignant neoplasm of colon: Secondary | ICD-10-CM

## 2021-09-06 DIAGNOSIS — E782 Mixed hyperlipidemia: Secondary | ICD-10-CM

## 2021-09-06 DIAGNOSIS — Z136 Encounter for screening for cardiovascular disorders: Secondary | ICD-10-CM

## 2021-09-06 DIAGNOSIS — R5383 Other fatigue: Secondary | ICD-10-CM

## 2021-09-06 DIAGNOSIS — E349 Endocrine disorder, unspecified: Secondary | ICD-10-CM

## 2021-09-06 LAB — VITAMIN D 25 HYDROXY (VIT D DEFICIENCY, FRACTURES): Vit D, 25-Hydroxy: 70 ng/mL (ref 30–100)

## 2021-09-07 LAB — LIPID PANEL
Cholesterol: 153 mg/dL (ref <200)
HDL: 53 mg/dL (ref >=40)
LDL Cholesterol (Calc): 76 mg/dL (calc)
Non-HDL Cholesterol (Calc): 100 mg/dL (calc) (ref <130)
Total CHOL/HDL Ratio: 2.9 (calc) (ref <5.0)
Triglycerides: 144 mg/dL (ref <150)

## 2021-09-07 LAB — URINALYSIS, ROUTINE W REFLEX MICROSCOPIC
Bilirubin Urine: NEGATIVE
Glucose, UA: NEGATIVE
Hgb urine dipstick: NEGATIVE
Ketones, ur: NEGATIVE
Leukocytes,Ua: NEGATIVE
Nitrite: NEGATIVE
Protein, ur: NEGATIVE
Specific Gravity, Urine: 1.02 (ref 1.001–1.035)
pH: 6 (ref 5.0–8.0)

## 2021-09-07 LAB — MICROALBUMIN / CREATININE URINE RATIO
Creatinine, Urine: 141 mg/dL (ref 20–320)
Microalb Creat Ratio: 5 mcg/mg creat (ref <30)
Microalb, Ur: 0.7 mg/dL

## 2021-09-07 LAB — HEMOGLOBIN A1C
Hgb A1c MFr Bld: 5.5 % of total Hgb (ref <5.7)
Mean Plasma Glucose: 111 mg/dL
eAG (mmol/L): 6.2 mmol/L

## 2021-09-07 LAB — COMPLETE METABOLIC PANEL WITH GFR
AG Ratio: 1.9 (calc) (ref 1.0–2.5)
ALT: 19 U/L (ref 9–46)
AST: 17 U/L (ref 10–35)
Albumin: 4.8 g/dL (ref 3.6–5.1)
Alkaline phosphatase (APISO): 48 U/L (ref 35–144)
BUN: 14 mg/dL (ref 7–25)
CO2: 31 mmol/L (ref 20–32)
Calcium: 10 mg/dL (ref 8.6–10.3)
Chloride: 103 mmol/L (ref 98–110)
Creat: 1.03 mg/dL (ref 0.70–1.35)
Globulin: 2.5 g/dL (calc) (ref 1.9–3.7)
Glucose, Bld: 86 mg/dL (ref 65–99)
Potassium: 4.7 mmol/L (ref 3.5–5.3)
Sodium: 140 mmol/L (ref 135–146)
Total Bilirubin: 0.6 mg/dL (ref 0.2–1.2)
Total Protein: 7.3 g/dL (ref 6.1–8.1)
eGFR: 82 mL/min/{1.73_m2} (ref >=60)

## 2021-09-07 LAB — INSULIN, RANDOM: Insulin: 13.4 u[IU]/mL

## 2021-09-07 LAB — CBC WITH DIFFERENTIAL/PLATELET
Absolute Monocytes: 347 cells/uL (ref 200–950)
Basophils Absolute: 88 cells/uL (ref 0–200)
Basophils Relative: 1.6 %
Eosinophils Absolute: 347 cells/uL (ref 15–500)
Eosinophils Relative: 6.3 %
HCT: 46.1 % (ref 38.5–50.0)
Hemoglobin: 15.5 g/dL (ref 13.2–17.1)
Lymphs Abs: 1749 cells/uL (ref 850–3900)
MCH: 30.2 pg (ref 27.0–33.0)
MCHC: 33.6 g/dL (ref 32.0–36.0)
MCV: 89.7 fL (ref 80.0–100.0)
MPV: 10.6 fL (ref 7.5–12.5)
Monocytes Relative: 6.3 %
Neutro Abs: 2970 cells/uL (ref 1500–7800)
Neutrophils Relative %: 54 %
Platelets: 240 10*3/uL (ref 140–400)
RBC: 5.14 10*6/uL (ref 4.20–5.80)
RDW: 12.5 % (ref 11.0–15.0)
Total Lymphocyte: 31.8 %
WBC: 5.5 10*3/uL (ref 3.8–10.8)

## 2021-09-07 LAB — TESTOSTERONE: Testosterone: 498 ng/dL (ref 250–827)

## 2021-09-07 LAB — IRON, TOTAL/TOTAL IRON BINDING CAP
%SAT: 37 % (calc) (ref 20–48)
Iron: 119 ug/dL (ref 50–180)
TIBC: 319 mcg/dL (calc) (ref 250–425)

## 2021-09-07 LAB — MAGNESIUM: Magnesium: 2.2 mg/dL (ref 1.5–2.5)

## 2021-09-07 LAB — TSH: TSH: 4.14 mIU/L (ref 0.40–4.50)

## 2021-09-07 LAB — VITAMIN B12: Vitamin B-12: >2000 pg/mL — ABNORMAL HIGH (ref 200–1100)

## 2021-09-07 LAB — PSA: PSA: 2.15 ng/mL (ref <=4.00)

## 2021-09-07 NOTE — Progress Notes (Signed)
=============================================================== °-   Test results slightly outside the reference range are not unusual. If there is anything important, I will review this with you,  otherwise it is considered normal test values.  If you have further questions,  please do not hesitate to contact me at the office or via My Chart.  =============================================================== ===============================================================  -  Vitamin B12  is Extremely High , So please cut back & only take it 1 x  /week =============================================================== ===============================================================  -  Vitamin D = 70 - Excellent   - Please keep dose same  =============================================================== ===============================================================  -  Iron levels - Normal & OK  =============================================================== ===============================================================  -  PSA - OK  in Normal Range  =============================================================== ===============================================================  -  Testosterone level - Normal & OK  =============================================================== ===============================================================  -  Total Chol = 153    &   LDL Chol = 76   - Both  Excellent   - Very low risk for Heart Attack  / Stroke ============================================================ ============================================================  -  A1c - Normal - No Diabetes   - Great  ! =============================================================== ===============================================================  -  All Else - CBC - Kidneys - Electrolytes - Liver - Magnesium & Thyroid    - all  Normal /  OK =============================================================== ===============================================================  -  Keep up the [place] Work  !  =============================================================== ===============================================================

## 2021-09-30 ENCOUNTER — Encounter: Payer: Self-pay | Admitting: Internal Medicine

## 2022-03-09 NOTE — Progress Notes (Unsigned)
FOLLOW UP  Assessment and Plan:   Paroxysmal a fib Rate controlled today; avoid triggers; flecainide PRN recurrence Continue with current medications for rate control, ASA Followed by Dr. Johnsie Cancel  Hypertension Well controlled with current medications  Monitor blood pressure at home; patient to call if consistently greater than 130/80 Continue DASH diet.   Reminder to go to the ER if any CP, SOB, nausea, dizziness, severe HA, changes vision/speech, left arm numbness and tingling and jaw pain.  Cholesterol On rosuvastatin 20 mg and tolerating well  Continue low cholesterol diet and exercise.  Check lipid panel.   Other abnormal glucose Continue diet and exercise.  Perform daily foot/skin check, notify office of any concerning changes.  Check CMP/GFR  Overweight BMI 25 Continue to recommend diet heavy in fruits and veggies and low in animal meats, cheeses, and dairy products, appropriate calorie intake Discuss exercise recommendations routinely Continue to monitor weight at each visit  Vitamin D Def At goal at last visit; continue supplementation for goal of 60-100 Defer Vit D level  GERD Well managed on current medications; doing well with PRN H2i Discussed diet, avoiding triggers and other lifestyle changes  Face lesion Try fluorouracil If no improvement plan to refer to [facility] center   Continue diet and meds as discussed. Further disposition pending results of labs. Discussed med's effects and SE's.   Over 30 minutes of exam, counseling, chart review, and critical decision making was performed.   Future Appointments  Date Time Provider [place]  09/12/2022 11:00 AM [name], MD [facility] None    ----------------------------------------------------------------------------------------------------------------------  HPI 61 y.o. male  presents for 6 month follow up on hypertension, cholesterol, glucose management, weight and vitamin D  deficiency.  He retired in 09/2020, working on his garden. Much less stressed -   He has 2 areas on forehead that appear to be seborrheic keratoses, has been using hydrocortisone with no change.  Does have fluorouracil at home  he has a diagnosis of GERD which is currently managed by PRN famotidine. Much improved since retirement.  Had NSAID gastritis with anemia in 09/2016  BMI is Body mass index is 25.25 kg/m., he has been working on diet and exercise. He has set a goal of 185 lb. He has been walking 2-3 miles 4-6 days a week, also doing more core exercise. He has increased fluid intake. Cut back on red meat, increased fruits and vegetables  Wt Readings from Last 3 Encounters:  03/10/22 191 lb 6.4 oz (86.8 kg)  09/06/21 194 lb 3.2 oz (88.1 kg)  02/18/21 189 lb (85.7 kg)   Patient has hx/o pAfib (CHADsVasc = 0) since 2012 and in recent years transitioned to flecainide PRN plan and is doing well, had 1 episode 3 weeks which resolved quickly with flecanide.    His blood pressure has been controlled at home, today their BP is BP: 110/70  BP Readings from Last 3 Encounters:  03/10/22 110/70  09/06/21 122/82  02/18/21 116/82  He does workout. He denies chest pain, shortness of breath, dizziness.   He is on cholesterol medication (rosuvastatin 20 mg daily, doing cash pay, get 40 mg tabs) and denies myalgias. His LDL cholesterol is at goal, trigs mildly elevated. The cholesterol last visit was:   Lab Results  Component Value Date   CHOL 153 09/06/2021   HDL 53 09/06/2021   LDLCALC 76 09/06/2021   TRIG 144 09/06/2021   CHOLHDL 2.9 09/06/2021    He has been working on diet  and exercise for glucose management, and denies foot ulcerations, increased appetite, nausea, paresthesia of the feet, polydipsia, polyuria, visual disturbances, vomiting and weight loss. Last A1C in the office was:  Lab Results  Component Value Date   HGBA1C 5.5 09/06/2021   Patient is on Vitamin D supplement   Lab  Results  Component Value Date   VD25OH 23 09/06/2021     He has a history of testosterone deficiency and is on zinc supplement and feels doing well with this.  Lab Results  Component Value Date   TESTOSTERONE 498 09/06/2021    Denies LUTs, gets up once per night. 5 out of 7 days. Lab Results  Component Value Date   PSA 2.15 09/06/2021   PSA 0.96 08/19/2020   PSA 0.9 07/31/2019     Current Medications:  Current Outpatient Medications on File Prior to Visit  Medication Sig   Ascorbic Acid (VITAMIN C) 1000 MG tablet Take 500 mg by mouth daily.    aspirin 81 MG tablet Take 81 mg by mouth daily.   CHELATED ZINC PO Take 50 mg by mouth daily.   cholecalciferol (VITAMIN D) 1000 UNITS tablet Take 5,000 Units by mouth daily.    Cyanocobalamin (VITAMIN B-12 PO) Take 3,000 mcg by mouth.   fish oil-omega-3 fatty acids 1000 MG capsule Take 2 g by mouth daily.   flecainide (TAMBOCOR) 100 MG tablet Take 3 tablets by mouth as needed for onset of afib   GARLIC PO Take by mouth 2 (two) times daily.   LUTEIN PO Take by mouth daily.   rosuvastatin (CRESTOR) 40 MG tablet Take  1 tablet  Daily  for Cholesterol  (Dx:  e 78.2)   TURMERIC PO Take by mouth.   No current facility-administered medications on file prior to visit.     Allergies: No Known Allergies   Medical History:  Past Medical History:  Diagnosis Date   Anemia    Arthritis    Diverticulitis    GERD (gastroesophageal reflux disease)    Hyperlipidemia    Other testicular hypofunction    Paroxysmal atrial fibrillation ([place])    Sigmoid diverticulitis 05/21/2018   Family history- Reviewed and unchanged Social history- Reviewed and unchanged   Review of Systems:  Review of Systems  Constitutional:  Negative for malaise/fatigue and weight loss.  HENT:  Negative for hearing loss and tinnitus.   Eyes:  Negative for blurred vision and double vision.  Respiratory:  Negative for cough, shortness of breath and wheezing.    Cardiovascular:  Negative for chest pain, palpitations, orthopnea, claudication and leg swelling.  Gastrointestinal:  Negative for abdominal pain, blood in stool, constipation, diarrhea, heartburn, melena, nausea and vomiting.  Genitourinary: Negative.   Musculoskeletal:  Positive for back pain (lumbar, left, intermittent, mild) and joint pain (left thumb, intermittent). Negative for myalgias.  Skin:  Negative for rash.       2 flaky lesions of forehead  Neurological:  Negative for dizziness, tingling, sensory change, weakness and headaches.  Endo/Heme/Allergies:  Negative for polydipsia.  Psychiatric/Behavioral: Negative.    All other systems reviewed and are negative.     Physical Exam: BP 110/70   Pulse 69   Temp (!) 97.3 F (36.3 C)   Wt 191 lb 6.4 oz (86.8 kg)   SpO2 97%   BMI 25.25 kg/m  Wt Readings from Last 3 Encounters:  03/10/22 191 lb 6.4 oz (86.8 kg)  09/06/21 194 lb 3.2 oz (88.1 kg)  02/18/21 189 lb (85.7 kg)  General Appearance: Well nourished, in no apparent distress. Eyes: PERRLA, EOMs, conjunctiva no swelling or erythema Sinuses: No Frontal/maxillary tenderness ENT/Mouth: Ext aud canals clear, TMs without erythema, bulging. No erythema, swelling, or exudate on post pharynx.  Tonsils not swollen or erythematous. Hearing normal.  Neck: Supple, thyroid normal.  Respiratory: Respiratory effort normal, BS equal bilaterally without rales, rhonchi, wheezing or stridor.  Cardio: RRR with no MRGs. Brisk peripheral pulses without edema.  Abdomen: Soft, + BS.  Non tender, no guarding, rebound, hernias, masses. Lymphatics: Non tender without lymphadenopathy.  Musculoskeletal: Full ROM, 5/5 strength, Normal gait Skin: Warm, dry . Forehead has seborrheic keratosis appearing lesion of left and right forehead Neuro: Cranial nerves intact. No cerebellar symptoms.  Psych: Awake and oriented X 3, normal affect, Insight and Judgment appropriate.    Alycia Rossetti,  NP 9:47 AM [facility]

## 2022-03-10 ENCOUNTER — Ambulatory Visit (INDEPENDENT_AMBULATORY_CARE_PROVIDER_SITE_OTHER): Payer: 59 | Admitting: Nurse Practitioner

## 2022-03-10 ENCOUNTER — Encounter: Payer: Self-pay | Admitting: Nurse Practitioner

## 2022-03-10 VITALS — BP 110/70 | HR 69 | Temp 97.3°F | Wt 191.4 lb

## 2022-03-10 DIAGNOSIS — I10 Essential (primary) hypertension: Secondary | ICD-10-CM | POA: Diagnosis not present

## 2022-03-10 DIAGNOSIS — I480 Paroxysmal atrial fibrillation: Secondary | ICD-10-CM | POA: Diagnosis not present

## 2022-03-10 DIAGNOSIS — E663 Overweight: Secondary | ICD-10-CM

## 2022-03-10 DIAGNOSIS — L989 Disorder of the skin and subcutaneous tissue, unspecified: Secondary | ICD-10-CM

## 2022-03-10 DIAGNOSIS — E782 Mixed hyperlipidemia: Secondary | ICD-10-CM | POA: Diagnosis not present

## 2022-03-10 DIAGNOSIS — Z6825 Body mass index (BMI) 25.0-25.9, adult: Secondary | ICD-10-CM

## 2022-03-10 DIAGNOSIS — R7309 Other abnormal glucose: Secondary | ICD-10-CM

## 2022-03-10 DIAGNOSIS — K219 Gastro-esophageal reflux disease without esophagitis: Secondary | ICD-10-CM

## 2022-03-10 DIAGNOSIS — E559 Vitamin D deficiency, unspecified: Secondary | ICD-10-CM

## 2022-03-10 DIAGNOSIS — Z6824 Body mass index (BMI) 24.0-24.9, adult: Secondary | ICD-10-CM

## 2022-03-10 DIAGNOSIS — Z79899 Other long term (current) drug therapy: Secondary | ICD-10-CM

## 2022-03-11 LAB — LIPID PANEL
Cholesterol: 132 mg/dL (ref <200)
HDL: 49 mg/dL (ref >=40)
LDL Cholesterol (Calc): 67 mg/dL (calc)
Non-HDL Cholesterol (Calc): 83 mg/dL (calc) (ref <130)
Total CHOL/HDL Ratio: 2.7 (calc) (ref <5.0)
Triglycerides: 80 mg/dL (ref <150)

## 2022-03-11 LAB — CBC WITH DIFFERENTIAL/PLATELET
Absolute Monocytes: 331 cells/uL (ref 200–950)
Basophils Absolute: 91 cells/uL (ref 0–200)
Basophils Relative: 1.9 %
Eosinophils Absolute: 461 cells/uL (ref 15–500)
Eosinophils Relative: 9.6 %
HCT: 43.7 % (ref 38.5–50.0)
Hemoglobin: 14.8 g/dL (ref 13.2–17.1)
Lymphs Abs: 1738 cells/uL (ref 850–3900)
MCH: 30.3 pg (ref 27.0–33.0)
MCHC: 33.9 g/dL (ref 32.0–36.0)
MCV: 89.5 fL (ref 80.0–100.0)
MPV: 10.7 fL (ref 7.5–12.5)
Monocytes Relative: 6.9 %
Neutro Abs: 2179 cells/uL (ref 1500–7800)
Neutrophils Relative %: 45.4 %
Platelets: 233 10*3/uL (ref 140–400)
RBC: 4.88 10*6/uL (ref 4.20–5.80)
RDW: 12.6 % (ref 11.0–15.0)
Total Lymphocyte: 36.2 %
WBC: 4.8 10*3/uL (ref 3.8–10.8)

## 2022-03-11 LAB — COMPLETE METABOLIC PANEL WITH GFR
AG Ratio: 1.7 (calc) (ref 1.0–2.5)
ALT: 18 U/L (ref 9–46)
AST: 20 U/L (ref 10–35)
Albumin: 4.4 g/dL (ref 3.6–5.1)
Alkaline phosphatase (APISO): 52 U/L (ref 35–144)
BUN: 19 mg/dL (ref 7–25)
CO2: 25 mmol/L (ref 20–32)
Calcium: 9.4 mg/dL (ref 8.6–10.3)
Chloride: 107 mmol/L (ref 98–110)
Creat: 0.94 mg/dL (ref 0.70–1.35)
Globulin: 2.6 g/dL (calc) (ref 1.9–3.7)
Glucose, Bld: 88 mg/dL (ref 65–99)
Potassium: 4.4 mmol/L (ref 3.5–5.3)
Sodium: 139 mmol/L (ref 135–146)
Total Bilirubin: 0.7 mg/dL (ref 0.2–1.2)
Total Protein: 7 g/dL (ref 6.1–8.1)
eGFR: 92 mL/min/{1.73_m2} (ref >=60)

## 2022-03-11 LAB — TSH: TSH: 3.27 mIU/L (ref 0.40–4.50)

## 2022-05-25 ENCOUNTER — Telehealth: Payer: 59 | Admitting: Physician Assistant

## 2022-05-25 DIAGNOSIS — J019 Acute sinusitis, unspecified: Secondary | ICD-10-CM

## 2022-05-25 DIAGNOSIS — B9789 Other viral agents as the cause of diseases classified elsewhere: Secondary | ICD-10-CM

## 2022-05-25 MED ORDER — AZELASTINE HCL 0.1 % NA SOLN: 1.0000 | Freq: Two times a day (BID) | NASAL | 0 refills | Status: DC

## 2022-05-25 MED ORDER — BENZONATATE 100 MG PO CAPS: 100.0000 mg | ORAL_CAPSULE | Freq: Three times a day (TID) | ORAL | 0 refills | Status: DC | PRN

## 2022-05-25 NOTE — Progress Notes (Signed)
E-Visit for Sinus Problems  We are sorry that you are not feeling well.  Here is how we plan to help!  Based on what you have shared with me it looks like you have sinusitis.  Sinusitis is inflammation and infection in the sinus cavities of the head.  Based on your presentation I believe you most likely have Acute Viral Sinusitis.This is an infection most likely caused by a virus. There is not specific treatment for viral sinusitis other than to help you with the symptoms until the infection runs its course.  You may use an oral decongestant such as Mucinex D or if you have glaucoma or high blood pressure use plain Mucinex. Saline nasal spray help and can safely be used as often as needed for congestion, I have prescribed: Azelastine nasal spray 2 sprays in each nostril twice a day. I have also prescribed Tessalon perles for any cough. May take 1 capsule every 8 hours as needed.   Some authorities believe that zinc sprays or the use of Echinacea may shorten the course of your symptoms.  Sinus infections are not as easily transmitted as other respiratory infection, however we still recommend that you avoid close contact with loved ones, especially the very young and elderly.  Remember to wash your hands thoroughly throughout the day as this is the number one way to prevent the spread of infection!  Home Care: Only take medications as instructed by your medical team. Do not take these medications with alcohol. A steam or ultrasonic humidifier can help congestion.  You can place a towel over your head and breathe in the steam from hot water coming from a faucet. Avoid close contacts especially the very young and the elderly. Cover your mouth when you cough or sneeze. Always remember to wash your hands.  Get Help Right Away If: You develop worsening fever or sinus pain. You develop a severe head ache or visual changes. Your symptoms persist after you have completed your treatment plan.  Make sure  you Understand these instructions. Will watch your condition. Will get help right away if you are not doing well or get worse.   Thank you for choosing an e-visit.  Your e-visit answers were reviewed by a board certified advanced clinical practitioner to complete your personal care plan. Depending upon the condition, your plan could have included both over the counter or prescription medications.  Please review your pharmacy choice. Make sure the pharmacy is open so you can pick up prescription now. If there is a problem, you may contact your provider through [employer] and have the prescription routed to another pharmacy.  Your safety is important to [place]. If you have drug allergies check your prescription carefully.   For the next 24 hours you can use MyChart to ask questions about today's visit, request a non-urgent call back, or ask for a work or school excuse. You will get an email in the next two days asking about your experience. I hope that your e-visit has been valuable and will speed your recovery.  I have spent 5 minutes in review of e-visit questionnaire, review and updating patient chart, medical decision making and response to patient.   Mar Daring, PA-C

## 2022-08-07 ENCOUNTER — Other Ambulatory Visit: Payer: Self-pay | Admitting: Internal Medicine

## 2022-08-07 DIAGNOSIS — E782 Mixed hyperlipidemia: Secondary | ICD-10-CM

## 2022-09-12 ENCOUNTER — Encounter: Payer: 59 | Admitting: Internal Medicine

## 2022-10-09 ENCOUNTER — Encounter: Payer: Self-pay | Admitting: Internal Medicine

## 2022-10-09 NOTE — Progress Notes (Unsigned)
Annual  Screening/Preventative Visit  & Comprehensive Evaluation & Examination   Future Appointments  Date Time Provider Department  10/10/2022 11:00 AM [name], MD [facility]  10/17/2023 11:00 AM [name], MD [facility]              This very nice 62 y.o.  MWM   with  labile HTN, HLD, Prediabetes and Vitamin D Deficiency presents for a Screening /Preventative Visit & comprehensive evaluation and management of multiple medical co-morbidities.  Patient has hx/o GERD controlled by diet .         Labile  HTN  followed expectantly predates since 2008. Patient's BP has been controlled at home.  Today's BP is at goal -  130/84.     Patient has hx/o pAfib predating from 2008 - seen by Dr Johnsie Cancel & managed with "pill in pocket".  Patient reports having more frequent episodes of palpitations.  Patient denies any cardiac symptoms as chest pain, palpitations, shortness of breath, dizziness or ankle swelling.        Patient's hyperlipidemia is controlled with diet and rosuvastatin. Patient denies myalgias or other medication SE's. Last lipids were at goal :  Lab Results  Component Value Date   CHOL 132 03/10/2022   HDL 49 03/10/2022   LDLCALC 67 03/10/2022   TRIG 80 03/10/2022   CHOLHDL 2.7 03/10/2022         Patient has hx/o prediabetes (A1c 5.9% /2016)  and patient denies reactive hypoglycemic symptoms, visual blurring, diabetic polys or paresthesias. Last A1c was at goal :   Lab Results  Component Value Date   HGBA1C 5.5 09/06/2021         Finally, patient has history of Vitamin D Deficiency ("17" /2008) and last vitamin D was at goal :   Lab Results  Component Value Date   VD25OH 85 09/06/2021       Current Outpatient Medications on File Prior to Visit  Medication Sig   VITAMIN C 1000 MG tablet Take  daily.    aspirin 81 MG tablet Take  daily.   CHELATED ZINC PO Take 50 mg daily.   VITAMIN D 5,000 u Take 5,000 Units daily.    VITAMIN B-12 tab 3,000  mcg   Take daily   fish oil-omega-3 fatty acids 1000 MG capsule Take 2 g  daily.   Flecainide  100 MG tablet Take 3 tablets as needed for onset of afib   GARLIC PO Take 2 times daily.   LUTEIN PO Take  daily.   rosuvastatin  40 MG tablet Take  1 tablet  Daily   TURMERIC  Take daily    No Known Allergies   Past Medical History:  Diagnosis Date   Anemia    Arthritis    Diverticulitis    GERD (gastroesophageal reflux disease)    Hyperlipidemia    Other testicular hypofunction    Paroxysmal atrial fibrillation ([place])    Sigmoid diverticulitis 05/21/2018     Health Maintenance  Topic Date Due   HIV Screening  Never done   Hepatitis C Screening  Never done   Zoster Vaccines- Shingrix (1 of 2) Never done   COVID-19 Vaccine (4 - Booster for Pfizer series) 08/20/2020   INFLUENZA VACCINE  02/22/2021   TETANUS/TDAP  06/27/2021   HPV VACCINES  Aged Out     Immunization History  Administered Date(s) Administered   Influenza Inj Mdck Quad 05/17/2019   Influenza Inj Mdck Quad 05/31/2017, 05/12/2020  Influenza  04/16/2013, 04/22/2014, 04/27/2015   Influenza,inj,Quad  05/17/2019   Influenza- 04/18/2016, 04/24/2018   PFIZER SARS-COV-2 Vacc 10/10/2019, 11/04/2019, 06/25/2020   PPD Test 07/03/2018, 07/31/2019, 08/19/2020   Pneumococcal -23 04/12/2012   Tdap 06/28/2011    Last Colon - 10/12/2018 - Dr Ardis Hughs - Recc 7 yr f/u - due Apr 2027  Past Surgical History:  Procedure Laterality Date   CHONDROPLASTY Left 09/15/2016   Procedure: CHONDROPLASTY;  Surgeon: Ninetta Lights, MD;  Location: [place] [facility];  Service: Orthopedics;  Laterality: Left;   COLONOSCOPY  last 06/15/2009   KNEE ARTHROSCOPY W/ MENISCAL REPAIR  2009   right   KNEE ARTHROSCOPY WITH MEDIAL MENISECTOMY Left 09/15/2016   Procedure: LEFT KNEE ARTHROSCOPY CHONDROPLASTY  WITH MEDIAL MENISECTOMY;  Ninetta Lights, MD   PROXIMAL INTERPHALANGEAL FUSION (PIP) Left 05/14/2013   Procedure: RECONSTRUCTION  BOUTONNIERE (LEFT SMALL FINGER) PINNING PROXIMAL INTERPHALANGEAL FUSION (PIP);  Surgeon: Wynonia Sours, MD     Family History  Problem Relation Age of Onset   Transient ischemic attack Mother    Stroke Father    Heart disease Father    Hypertension Father    Cancer Maternal Grandmother        colon    Colon cancer Maternal Grandmother 1   Stomach cancer Neg Hx    Rectal cancer Neg Hx    Esophageal cancer Neg Hx    Liver cancer Neg Hx    Colon polyps Neg Hx      Social History   Tobacco Use   Smoking status: Never   Smokeless tobacco: Never  Vaping Use   Vaping Use: Never used  Substance Use Topics   Alcohol use: No   Drug use: No      ROS Constitutional: Denies fever, chills, weight loss/gain, headaches, insomnia,  night sweats or change in appetite. Does c/o fatigue. Eyes: Denies redness, blurred vision, diplopia, discharge, itchy or watery eyes.  ENT: Denies discharge, congestion, post nasal drip, epistaxis, sore throat, earache, hearing loss, dental pain, Tinnitus, Vertigo, Sinus pain or snoring.  Cardio: Denies chest pain, palpitations, irregular heartbeat, syncope, dyspnea, diaphoresis, orthopnea, PND, claudication or edema Respiratory: denies cough, dyspnea, DOE, pleurisy, hoarseness, laryngitis or wheezing.  Gastrointestinal: Denies dysphagia, heartburn, reflux, water brash, pain, cramps, nausea, vomiting, bloating, diarrhea, constipation, hematemesis, melena, hematochezia, jaundice or hemorrhoids Genitourinary: Denies dysuria, frequency, urgency, nocturia, hesitancy, discharge, hematuria or flank pain Musculoskeletal: Denies arthralgia, myalgia, stiffness, Jt. Swelling, pain, limp or strain/sprain. Denies Falls. Skin: Denies puritis, rash, hives, warts, acne, eczema or change in skin lesion Neuro: No weakness, tremor, incoordination, spasms, paresthesia or pain Psychiatric: Denies confusion, memory loss or sensory loss. Denies Depression. Endocrine: Denies change  in weight, skin, hair change, nocturia, and paresthesia, diabetic polys, visual blurring or hyper / hypo glycemic episodes.  Heme/Lymph: No excessive bleeding, bruising or enlarged lymph nodes.   Physical Exam  BP 130/84   Pulse 77   Temp 97.9 F (36.6 C)   Resp 17   Ht 6' (1.829 m)   Wt 186 lb 6.4 oz (84.6 kg)   SpO2 98%   BMI 25.28 kg/m   General Appearance: Well nourished and well groomed and in no apparent distress.  Eyes: PERRLA, EOMs, conjunctiva no swelling or erythema, normal fundi and vessels. Sinuses: No frontal/maxillary tenderness ENT/Mouth: EACs patent / TMs  nl. Nares clear without erythema, swelling, mucoid exudates. Oral hygiene is good. No erythema, swelling, or exudate. Tongue normal, non-obstructing. Tonsils not swollen or erythematous. Hearing  normal.  Neck: Supple, thyroid not palpable. No bruits, nodes or JVD. Respiratory: Respiratory effort normal.  BS equal and clear bilateral without rales, rhonci, wheezing or stridor. Cardio: Heart sounds are normal with regular rate and rhythm and no murmurs, rubs or gallops. Peripheral pulses are normal and equal bilaterally without edema. No aortic or femoral bruits. Chest: symmetric with normal excursions and percussion.  Abdomen: Soft, with Nl bowel sounds. Nontender, no guarding, rebound, hernias, masses, or organomegaly.  Lymphatics: Non tender without lymphadenopathy.  Musculoskeletal: Full ROM all peripheral extremities, joint stability, 5/5 strength, and normal gait. Skin: Warm and dry without rashes, lesions, cyanosis, clubbing or  ecchymosis.  Neuro: Cranial nerves intact, reflexes equal bilaterally. Normal muscle tone, no cerebellar symptoms. Sensation intact.  Pysch: Alert and oriented X 3 with normal affect, insight and judgment appropriate.   Assessment and Plan  1. Annual Preventative/Screening Exam    2. Essential hypertension  - EKG 12-Lead - [place], RETROPERITNL ABD,  LTD - Urinalysis, Routine w  reflex microscopic - Microalbumin / creatinine urine ratio - CBC with Differential/Platelet - COMPLETE METABOLIC PANEL WITH GFR - Magnesium - TSH  3. Hyperlipidemia, mixed  - EKG 12-Lead - [place], RETROPERITNL ABD,  LTD - Lipid panel - TSH  4. Abnormal glucose  - EKG 12-Lead - [place], RETROPERITNL ABD,  LTD - Hemoglobin A1c - Insulin, random  5. Vitamin D deficiency  - VITAMIN D 25 Hydroxy   6. Paroxysmal atrial fibrillation ([facility])  - Rx -  Diltiazem 240 mg qd  - EKG 12-Lead - TSH  7. Testosterone deficiency  - Testosterone  8. Gastroesophageal reflux disease  - CBC with Differential/Platelet  9. Screening for colorectal cancer  - POC Hemoccult Bld/Stl   10. Screening examination for pulmonary tuberculosis  - TB Skin Test  11. Screening for prostate cancer  - PSA  12. Screening for ischemic heart disease  - EKG 12-Lead  13. FHx: heart disease  - EKG 12-Lead - [place], RETROPERITNL ABD,  LTD  14. Screening for AAA (aortic abdominal aneurysm)  - [place], RETROPERITNL ABD,  LTD  15. Fatigue, unspecified type  - Iron, Total/Total Iron Binding Cap - Vitamin B12 - Testosterone - CBC with Differential/Platelet - TSH  16. Medication management  - Urinalysis, Routine w reflex microscopic - Microalbumin / creatinine urine ratio - Magnesium - Lipid panel - TSH - Hemoglobin A1c - Insulin, random - VITAMIN D 25 Hydroxy           Patient was counseled in prudent diet, weight control to achieve/maintain BMI less than 25, BP monitoring, regular exercise and medications as discussed.  Discussed med effects and SE's. Routine screening labs and tests as requested with regular follow-up as recommended. Over 40 minutes of exam, counseling, chart review and high complex critical decision making was performed   Kirtland Bouchard, MD

## 2022-10-09 NOTE — Patient Instructions (Signed)

## 2022-10-10 ENCOUNTER — Ambulatory Visit (INDEPENDENT_AMBULATORY_CARE_PROVIDER_SITE_OTHER): Payer: 59 | Admitting: Internal Medicine

## 2022-10-10 ENCOUNTER — Encounter: Payer: Self-pay | Admitting: Internal Medicine

## 2022-10-10 VITALS — BP 130/84 | HR 77 | Temp 97.9°F | Resp 17 | Ht 72.0 in | Wt 186.4 lb

## 2022-10-10 DIAGNOSIS — Z0000 Encounter for general adult medical examination without abnormal findings: Secondary | ICD-10-CM | POA: Diagnosis not present

## 2022-10-10 DIAGNOSIS — Z136 Encounter for screening for cardiovascular disorders: Secondary | ICD-10-CM

## 2022-10-10 DIAGNOSIS — E349 Endocrine disorder, unspecified: Secondary | ICD-10-CM

## 2022-10-10 DIAGNOSIS — R7309 Other abnormal glucose: Secondary | ICD-10-CM

## 2022-10-10 DIAGNOSIS — R0989 Other specified symptoms and signs involving the circulatory and respiratory systems: Secondary | ICD-10-CM | POA: Diagnosis not present

## 2022-10-10 DIAGNOSIS — R5383 Other fatigue: Secondary | ICD-10-CM

## 2022-10-10 DIAGNOSIS — K219 Gastro-esophageal reflux disease without esophagitis: Secondary | ICD-10-CM

## 2022-10-10 DIAGNOSIS — I700 Atherosclerosis of aorta: Secondary | ICD-10-CM | POA: Diagnosis not present

## 2022-10-10 DIAGNOSIS — Z1211 Encounter for screening for malignant neoplasm of colon: Secondary | ICD-10-CM

## 2022-10-10 DIAGNOSIS — E782 Mixed hyperlipidemia: Secondary | ICD-10-CM

## 2022-10-10 DIAGNOSIS — Z79899 Other long term (current) drug therapy: Secondary | ICD-10-CM

## 2022-10-10 DIAGNOSIS — I480 Paroxysmal atrial fibrillation: Secondary | ICD-10-CM

## 2022-10-10 DIAGNOSIS — Z0001 Encounter for general adult medical examination with abnormal findings: Secondary | ICD-10-CM

## 2022-10-10 DIAGNOSIS — Z111 Encounter for screening for respiratory tuberculosis: Secondary | ICD-10-CM | POA: Diagnosis not present

## 2022-10-10 DIAGNOSIS — Z125 Encounter for screening for malignant neoplasm of prostate: Secondary | ICD-10-CM

## 2022-10-10 DIAGNOSIS — Z8249 Family history of ischemic heart disease and other diseases of the circulatory system: Secondary | ICD-10-CM

## 2022-10-10 DIAGNOSIS — E559 Vitamin D deficiency, unspecified: Secondary | ICD-10-CM

## 2022-10-10 MED ORDER — DILTIAZEM HCL ER COATED BEADS 240 MG PO CP24: ORAL_CAPSULE | ORAL | 3 refills | Status: DC

## 2022-10-11 LAB — IRON, TOTAL/TOTAL IRON BINDING CAP
%SAT: 46 % (calc) (ref 20–48)
Iron: 144 ug/dL (ref 50–180)
TIBC: 314 mcg/dL (calc) (ref 250–425)

## 2022-10-11 LAB — CBC WITH DIFFERENTIAL/PLATELET
Absolute Monocytes: 363 cells/uL (ref 200–950)
Basophils Absolute: 78 cells/uL (ref 0–200)
Basophils Relative: 1.6 %
Eosinophils Absolute: 260 cells/uL (ref 15–500)
Eosinophils Relative: 5.3 %
HCT: 44 % (ref 38.5–50.0)
Hemoglobin: 14.8 g/dL (ref 13.2–17.1)
Lymphs Abs: 1558 cells/uL (ref 850–3900)
MCH: 29.5 pg (ref 27.0–33.0)
MCHC: 33.6 g/dL (ref 32.0–36.0)
MCV: 87.8 fL (ref 80.0–100.0)
MPV: 10.6 fL (ref 7.5–12.5)
Monocytes Relative: 7.4 %
Neutro Abs: 2641 cells/uL (ref 1500–7800)
Neutrophils Relative %: 53.9 %
Platelets: 225 10*3/uL (ref 140–400)
RBC: 5.01 10*6/uL (ref 4.20–5.80)
RDW: 12.4 % (ref 11.0–15.0)
Total Lymphocyte: 31.8 %
WBC: 4.9 10*3/uL (ref 3.8–10.8)

## 2022-10-11 LAB — COMPLETE METABOLIC PANEL WITH GFR
AG Ratio: 1.5 (calc) (ref 1.0–2.5)
ALT: 23 U/L (ref 9–46)
AST: 21 U/L (ref 10–35)
Albumin: 4.3 g/dL (ref 3.6–5.1)
Alkaline phosphatase (APISO): 51 U/L (ref 35–144)
BUN: 16 mg/dL (ref 7–25)
CO2: 27 mmol/L (ref 20–32)
Calcium: 9.2 mg/dL (ref 8.6–10.3)
Chloride: 105 mmol/L (ref 98–110)
Creat: 0.92 mg/dL (ref 0.70–1.35)
Globulin: 2.8 g/dL (calc) (ref 1.9–3.7)
Glucose, Bld: 88 mg/dL (ref 65–99)
Potassium: 4.4 mmol/L (ref 3.5–5.3)
Sodium: 139 mmol/L (ref 135–146)
Total Bilirubin: 0.7 mg/dL (ref 0.2–1.2)
Total Protein: 7.1 g/dL (ref 6.1–8.1)
eGFR: 93 mL/min/{1.73_m2} (ref >=60)

## 2022-10-11 LAB — LIPID PANEL
Cholesterol: 135 mg/dL (ref <200)
HDL: 53 mg/dL (ref >=40)
LDL Cholesterol (Calc): 66 mg/dL (calc)
Non-HDL Cholesterol (Calc): 82 mg/dL (calc) (ref <130)
Total CHOL/HDL Ratio: 2.5 (calc) (ref <5.0)
Triglycerides: 79 mg/dL (ref <150)

## 2022-10-11 LAB — VITAMIN D 25 HYDROXY (VIT D DEFICIENCY, FRACTURES): Vit D, 25-Hydroxy: 77 ng/mL (ref 30–100)

## 2022-10-11 LAB — VITAMIN B12: Vitamin B-12: 1643 pg/mL — ABNORMAL HIGH (ref 200–1100)

## 2022-10-11 LAB — HEMOGLOBIN A1C
Hgb A1c MFr Bld: 5.7 % of total Hgb — ABNORMAL HIGH (ref <5.7)
Mean Plasma Glucose: 117 mg/dL
eAG (mmol/L): 6.5 mmol/L

## 2022-10-11 LAB — URINALYSIS, ROUTINE W REFLEX MICROSCOPIC
Bilirubin Urine: NEGATIVE
Glucose, UA: NEGATIVE
Hgb urine dipstick: NEGATIVE
Ketones, ur: NEGATIVE
Leukocytes,Ua: NEGATIVE
Nitrite: NEGATIVE
Protein, ur: NEGATIVE
Specific Gravity, Urine: 1.021 (ref 1.001–1.035)
pH: <=5 (ref 5.0–8.0)

## 2022-10-11 LAB — MICROALBUMIN / CREATININE URINE RATIO
Creatinine, Urine: 150 mg/dL (ref 20–320)
Microalb Creat Ratio: 3 mg/g creat (ref <30)
Microalb, Ur: 0.5 mg/dL

## 2022-10-11 LAB — TSH: TSH: 3.96 mIU/L (ref 0.40–4.50)

## 2022-10-11 LAB — PSA: PSA: 1.98 ng/mL (ref <=4.00)

## 2022-10-11 LAB — INSULIN, RANDOM: Insulin: 9.6 u[IU]/mL

## 2022-10-11 LAB — TESTOSTERONE: Testosterone: 452 ng/dL (ref 250–827)

## 2022-10-11 LAB — MAGNESIUM: Magnesium: 2 mg/dL (ref 1.5–2.5)

## 2022-10-11 NOTE — Progress Notes (Signed)
<><><><><><><><><><><><><><><><><><><><><><><><><><><><><><><><><> <><><><><><><><><><><><><><><><><><><><><><><><><><><><><><><><><> -   Test results slightly outside the reference range are not unusual. If there is anything important, I will review this with you,  otherwise it is considered normal test values.  If you have further questions,  please do not hesitate to contact me at the office or via My Chart.  <><><><><><><><><><><><><><><><><><><><><><><><><><><><><><><><><> <><><><><><><><><><><><><><><><><><><><><><><><><><><><><><><><><>  -  Vitamin B12 level is way too  high  - Recommend decrease dose to only take     1 /week           <><><><><><><><><><><><><><><><><><><><><><><><><><><><><><><><><>  -    A1c = 5.7% - Blood sugar and A1c are elevated in the borderline and                                                                 early or pre-diabetes range which has the same   300% increased risk for heart attack, stroke, cancer and                                                   alzheimer- type vascular dementia as full blown diabetes.   But the good news is that diet, exercise with                                                            weight loss can cure the early diabetes at this point. <><><><><><><><><><><><><><><><><><><><><><><><><><><><><><><><><>  -  Iron levels are Normal & OK  <><><><><><><><><><><><><><><><><><><>- Great<><><><><><><><><><><><><><>  -  PSA -very low   - No Prostate    Cancer  <><><><><><><><><><><><><><><><><><><><><><><><><><><><><><><><><>  -  Testosterone Level -Normal & OK <><><><><><><><><><><><><><><><><><><><><><><><><><><><><><><><><>  -  Chol = 135  &  LDL = 66   - Both  Excellent   - Very low risk for Heart Attack  / Stroke  <><><><><><><><><><><><><><><><><><><><><><><><><><><><><><><><><> <><><><><><><><><><><><><><><><><><><><><><><><><><><><><><><><><>  -  Vitamin D = 77 - Excellent - Please keep dose same   <><><><><><><><><><><><><><><><><><><><><><><><><><><><><><><><><>  -  All Else - CBC - Kidneys - Electrolytes - Liver - Magnesium & Thyroid    - all  Normal / OK <><><><><><><><><><><><><><><><><><><><><><><><><><><><><><><><><> <><><><><><><><><><><><><><><><><><><><><><><><><><><><><><><><><>            -

## 2023-01-18 ENCOUNTER — Ambulatory Visit: Payer: 59 | Admitting: Nurse Practitioner

## 2023-04-03 ENCOUNTER — Other Ambulatory Visit: Payer: Self-pay | Admitting: Internal Medicine

## 2023-04-23 ENCOUNTER — Encounter: Payer: Self-pay | Admitting: Internal Medicine

## 2023-04-23 NOTE — Progress Notes (Unsigned)
Future Appointments  Date Time Provider Department  04/24/2023                   6 mo  2:30 PM Lucky Cowboy, MD [facility]  07/24/2023                 9 mo  9:30 AM Raynelle Dick, NP [facility]  10/23/2023                   cpe  2:00 PM Lucky Cowboy, MD [facility]    History of Present Illness:       This very nice 62 y.o. MWM presents for 6 month follow up with HTN, pAfib HLD, Pre-Diabetes and Vitamin D Deficiency.          Patient has hx/o pAfib predating from 2008 - seen by Dr Eden Emms & managed with "pill in pocket".  Patient reports having recently more frequent episodes of palpitations.         Patient is treated for HTN  since  & BP has been controlled at home. Today's BP was elevated at  158/98 & rechecked at 148/80. Patient has had no complaints of any cardiac type chest pain, palpitations, dyspnea Pollyann Kennedy /PND, dizziness, claudication or dependent edema.        Hyperlipidemia is controlled with diet & meds. Patient denies myalgias or other med SE's. Last Lipids were at goal :  Lab Results  Component Value Date   CHOL 135 10/10/2022   HDL 53 10/10/2022   LDLCALC 66 10/10/2022   TRIG 79 10/10/2022   CHOLHDL 2.5 10/10/2022      Also, the patient has history of T2_NIDDM PreDiabetes and has had no symptoms of reactive hypoglycemia, diabetic polys, paresthesias or visual blurring.  Last A1c was   Lab Results  Component Value Date   HGBA1C 5.7 (H) 10/10/2022         Further, the patient also has history of Vitamin D Deficiency and supplements vitamin D . Last vitamin D was  Lab Results  Component Value Date   VD25OH 77 10/10/2022      Current Outpatient Medications on File Prior to Visit  Medication Sig   VITAMIN C 1000 MG tablet Take 500 mg  daily.    aspirin 81 MG tablet Take  daily.   azelastine  0.1 % nasal spray Place 1-2 sprays into both nostrils 2 times daily. Use in each nostril as directed   ZINC 50 mg  Take daily.   VITAMIN D  5,000 Units Take  daily.    VITAMIN B-12 ,   3,000 mcg  Take  daily   diltiazem  XT  240 MG Take 1 capsule Daily   fish oil-omega-3  1000 MG capsule Take 2 g daily.   flecainide (100 MG tablet TAKE 3 TABLETS  AS NEEDED FOR  ONSET  OF  AFIB   GARLIC  Take 2  times daily.   LUTEIN Take daily.   rosuvastatin  40 MG tablet Take  1 tablet  Daily   TURMERIC Take daily     No Known Allergies    PMHx:   Past Medical History:  Diagnosis Date   Anemia    Arthritis    Diverticulitis    GERD (gastroesophageal reflux disease)    Hyperlipidemia    Other testicular hypofunction    Paroxysmal atrial fibrillation (HCC)    Sigmoid diverticulitis 05/21/2018      Immunization History  Administered  Date(s) Administered   Influenza Inj Mdck Quad  05/17/2019, 07/05/2021   Influenza Inj Mdck Quad  05/31/2017, 05/12/2020   Influenza Split 04/16/2013, 04/22/2014, 04/27/2015   Influenza,inj,Quad 05/17/2019   Influenza 04/18/2016, 04/24/2018   PFIZER SARS-COV-2 Vacc 10/10/2019, 11/04/2019, 06/25/2020   PPD Test 07/31/2019, 08/19/2020, 09/06/2021, 10/10/2022   Pneumococcal -23 04/12/2012   Tdap 06/28/2011   Zoster Recombinant(Shingrix) 07/05/2021     Past Surgical History:  Procedure Laterality Date   CHONDROPLASTY Left 09/15/2016   Procedure: CHONDROPLASTY;  Surgeon: Loreta Ave, MD;  Location: [facility];  Service: Orthopedics;  Laterality: Left;   COLONOSCOPY  last 06/15/2009   KNEE ARTHROSCOPY W/ MENISCAL REPAIR  2009   right   KNEE ARTHROSCOPY WITH MEDIAL MENISECTOMY Left 09/15/2016   Procedure: LEFT KNEE ARTHROSCOPY CHONDROPLASTY  WITH MEDIAL MENISECTOMY;  Surgeon: Loreta Ave, MD;  Location: [facility];  Service: Orthopedics;  Laterality: Left;   PROXIMAL INTERPHALANGEAL FUSION (PIP) Left 05/14/2013   Procedure: RECONSTRUCTION BOUTONNIERE (LEFT SMALL FINGER) PINNING PROXIMAL INTERPHALANGEAL FUSION (PIP);  Surgeon: Nicki Reaper, MD;  Location:  [facility];  Service: Orthopedics;  Laterality: Left;     FHx:    Reviewed / unchanged   SHx:    Reviewed / unchanged    Systems Review:  Constitutional: Denies fever, chills, wt changes, headaches, insomnia, fatigue, night sweats, change in appetite. Eyes: Denies redness, blurred vision, diplopia, discharge, itchy, watery eyes.  ENT: Denies discharge, congestion, post nasal drip, epistaxis, sore throat, earache, hearing loss, dental pain, tinnitus, vertigo, sinus pain, snoring.  CV: Denies chest pain, palpitations, irregular heartbeat, syncope, dyspnea, diaphoresis, orthopnea, PND, claudication or edema. Respiratory: denies cough, dyspnea, DOE, pleurisy, hoarseness, laryngitis, wheezing.  Gastrointestinal: Denies dysphagia, odynophagia, heartburn, reflux, water brash, abdominal pain or cramps, nausea, vomiting, bloating, diarrhea, constipation, hematemesis, melena, hematochezia  or hemorrhoids. Genitourinary: Denies dysuria, frequency, urgency, nocturia, hesitancy, discharge, hematuria or flank pain. Musculoskeletal: Denies arthralgias, myalgias, stiffness, jt. swelling, pain, limping or strain/sprain.  Skin: Denies pruritus, rash, hives, warts, acne, eczema or change in skin lesion(s). Neuro: No weakness, tremor, incoordination, spasms, paresthesia or pain. Psychiatric: Denies confusion, memory loss or sensory loss. Endo: Denies change in weight, skin or hair change.  Heme/Lymph: No excessive bleeding, bruising or enlarged lymph nodes.   Physical Exam  BP (!) 158/98   Pulse 76   Temp 97.9 F (36.6 C)   Resp 16   Ht 6' (1.829 m)   Wt 194 lb (88 kg)   SpO2 96%   BMI 26.31 kg/m   Appears  well nourished, well groomed  and in no distress.  Eyes: PERRLA, EOMs, conjunctiva no swelling or erythema. Sinuses: No frontal/maxillary tenderness ENT/Mouth: EAC's clear, TM's nl w/o erythema, bulging. Nares clear w/o erythema, swelling, exudates. Oropharynx clear without  erythema or exudates. Oral hygiene is good. Tongue normal, non obstructing. Hearing intact.  Neck: Supple. Thyroid not palpable. Car 2+/2+ without bruits, nodes or JVD. Chest: Respirations nl with BS clear & equal w/o rales, rhonchi, wheezing or stridor.  Cor: Heart sounds normal w/ regular rate and rhythm without sig. murmurs, gallops, clicks or rubs. Peripheral pulses normal and equal  without edema.  Abdomen: Soft & bowel sounds normal. Non-tender w/o guarding, rebound, hernias, masses or organomegaly.  Lymphatics: Unremarkable.  Musculoskeletal: Full ROM all peripheral extremities, joint stability, 5/5 strength and normal gait.  Skin: Warm, dry without exposed rashes, lesions or ecchymosis apparent.  Neuro: Cranial nerves intact, reflexes equal bilaterally. Sensory-motor testing  grossly intact. Tendon reflexes grossly intact.  Pysch: Alert & oriented x 3.  Insight and judgement nl & appropriate. No ideations.   Assessment and Plan:   1. Labile hypertension  - Continue medication, monitor blood pressure at home.  - Continue DASH diet.  Reminder to go to the ER if any CP,  SOB, nausea, dizziness, severe HA, changes vision/speech.   - CBC with Differential/Platelet - COMPLETE METABOLIC PANEL WITH GFR - Magnesium - TSH   2. Hyperlipidemia, mixed  - Continue diet/meds, exercise,& lifestyle modifications.  - Continue monitor periodic cholesterol/liver & renal functions    - Lipid panel - TSH   3. Abnormal glucose  - Continue diet, exercise  - Lifestyle modifications.  - Monitor appropriate labs   - Hemoglobin A1c - Insulin, random   4. Vitamin D deficiency  - Continue supplementation   - VITAMIN D 25 Hydroxy    5. Paroxysmal A-fib (HCC)  - TSH - Zio patch x 14 days - Cardiology consult  6. Medication management  - CBC with Differential/Platelet - COMPLETE METABOLIC PANEL WITH GFR - Magnesium - Lipid panel - TSH - Hemoglobin A1c - Insulin, random -  VITAMIN D 25 Hydroxy          Discussed  regular exercise, BP monitoring, weight control to achieve/maintain BMI less than 25 and discussed med and SE's. Recommended labs to assess /monitor clinical status .  I discussed the assessment and treatment plan with the patient. The patient was provided an opportunity to ask questions and all were answered. The patient agreed with the plan and demonstrated an understanding of the instructions.  I provided over 30 minutes of exam, counseling, chart review and  complex critical decision making.        The patient was advised to call back or seek an in-person evaluation if the symptoms worsen or if the condition fails to improve as anticipated.   Marinus Maw, MD

## 2023-04-23 NOTE — Patient Instructions (Signed)

## 2023-04-24 ENCOUNTER — Encounter: Payer: Self-pay | Admitting: Internal Medicine

## 2023-04-24 ENCOUNTER — Ambulatory Visit: Payer: 59 | Attending: Internal Medicine

## 2023-04-24 ENCOUNTER — Other Ambulatory Visit: Payer: Self-pay | Admitting: Internal Medicine

## 2023-04-24 ENCOUNTER — Ambulatory Visit (INDEPENDENT_AMBULATORY_CARE_PROVIDER_SITE_OTHER): Payer: 59 | Admitting: Internal Medicine

## 2023-04-24 VITALS — BP 148/80 | HR 76 | Temp 97.9°F | Resp 16 | Ht 72.0 in | Wt 194.0 lb

## 2023-04-24 DIAGNOSIS — R7309 Other abnormal glucose: Secondary | ICD-10-CM

## 2023-04-24 DIAGNOSIS — Z23 Encounter for immunization: Secondary | ICD-10-CM

## 2023-04-24 DIAGNOSIS — E782 Mixed hyperlipidemia: Secondary | ICD-10-CM

## 2023-04-24 DIAGNOSIS — Z79899 Other long term (current) drug therapy: Secondary | ICD-10-CM

## 2023-04-24 DIAGNOSIS — R0989 Other specified symptoms and signs involving the circulatory and respiratory systems: Secondary | ICD-10-CM

## 2023-04-24 DIAGNOSIS — E559 Vitamin D deficiency, unspecified: Secondary | ICD-10-CM | POA: Diagnosis not present

## 2023-04-24 DIAGNOSIS — I480 Paroxysmal atrial fibrillation: Secondary | ICD-10-CM

## 2023-04-24 NOTE — Progress Notes (Unsigned)
Enrolled for [employer] to mail a ZIO XT long term holter monitor to the patients address on file.   DOD to read. 

## 2023-04-25 LAB — CBC WITH DIFFERENTIAL/PLATELET
Absolute Monocytes: 281 {cells}/uL (ref 200–950)
Basophils Absolute: 62 {cells}/uL (ref 0–200)
Basophils Relative: 1.2 %
Eosinophils Absolute: 364 {cells}/uL (ref 15–500)
Eosinophils Relative: 7 %
HCT: 43.4 % (ref 38.5–50.0)
Hemoglobin: 14.5 g/dL (ref 13.2–17.1)
Lymphs Abs: 1524 {cells}/uL (ref 850–3900)
MCH: 30 pg (ref 27.0–33.0)
MCHC: 33.4 g/dL (ref 32.0–36.0)
MCV: 89.9 fL (ref 80.0–100.0)
MPV: 10.2 fL (ref 7.5–12.5)
Monocytes Relative: 5.4 %
Neutro Abs: 2969 {cells}/uL (ref 1500–7800)
Neutrophils Relative %: 57.1 %
Platelets: 270 10*3/uL (ref 140–400)
RBC: 4.83 10*6/uL (ref 4.20–5.80)
RDW: 12.2 % (ref 11.0–15.0)
Total Lymphocyte: 29.3 %
WBC: 5.2 10*3/uL (ref 3.8–10.8)

## 2023-04-25 LAB — INSULIN, RANDOM: Insulin: 80.2 u[IU]/mL — ABNORMAL HIGH

## 2023-04-25 LAB — COMPLETE METABOLIC PANEL WITH GFR
AG Ratio: 1.4 (calc) (ref 1.0–2.5)
ALT: 23 U/L (ref 9–46)
AST: 20 U/L (ref 10–35)
Albumin: 4.4 g/dL (ref 3.6–5.1)
Alkaline phosphatase (APISO): 60 U/L (ref 35–144)
BUN: 21 mg/dL (ref 7–25)
CO2: 28 mmol/L (ref 20–32)
Calcium: 9.7 mg/dL (ref 8.6–10.3)
Chloride: 101 mmol/L (ref 98–110)
Creat: 0.99 mg/dL (ref 0.70–1.35)
Globulin: 3.1 g/dL (ref 1.9–3.7)
Glucose, Bld: 132 mg/dL — ABNORMAL HIGH (ref 65–99)
Potassium: 4.1 mmol/L (ref 3.5–5.3)
Sodium: 139 mmol/L (ref 135–146)
Total Bilirubin: 0.5 mg/dL (ref 0.2–1.2)
Total Protein: 7.5 g/dL (ref 6.1–8.1)
eGFR: 85 mL/min/{1.73_m2} (ref >=60)

## 2023-04-25 LAB — HEMOGLOBIN A1C
Hgb A1c MFr Bld: 5.8 %{Hb} — ABNORMAL HIGH (ref <5.7)
Mean Plasma Glucose: 120 mg/dL
eAG (mmol/L): 6.6 mmol/L

## 2023-04-25 LAB — LIPID PANEL
Cholesterol: 170 mg/dL (ref <200)
HDL: 52 mg/dL (ref >=40)
LDL Cholesterol (Calc): 96 mg/dL
Non-HDL Cholesterol (Calc): 118 mg/dL (ref <130)
Total CHOL/HDL Ratio: 3.3 (calc) (ref <5.0)
Triglycerides: 123 mg/dL (ref <150)

## 2023-04-25 LAB — MAGNESIUM: Magnesium: 2.1 mg/dL (ref 1.5–2.5)

## 2023-04-25 LAB — TSH: TSH: 3.13 m[IU]/L (ref 0.40–4.50)

## 2023-04-25 LAB — VITAMIN D 25 HYDROXY (VIT D DEFICIENCY, FRACTURES): Vit D, 25-Hydroxy: 76 ng/mL (ref 30–100)

## 2023-04-25 NOTE — Progress Notes (Signed)
<>*<>*<>*<>*<>*<>*<>*<>*<>*<>*<>*<>*<>*<>*<>*<>*<>*<>*<>*<>*<>*<>*<>*<>*<> <>*<>*<>*<>*<>*<>*<>*<>*<>*<>*<>*<>*<>*<>*<>*<>*<>*<>*<>*<>*<>*<>*<>*<>*<>  -Test results slightly outside the reference range are not unusual. If there is anything important, I will review this with you,  otherwise it is considered normal test values.  If you have further questions,  please do not hesitate to contact me at the office or via My Chart.   <>*<>*<>*<>*<>*<>*<>*<>*<>*<>*<>*<>*<>*<>*<>*<>*<>*<>*<>*<>*<>*<>*<>*<>*<> <>*<>*<>*<>*<>*<>*<>*<>*<>*<>*<>*<>*<>*<>*<>*<>*<>*<>*<>*<>*<>*<>*<>*<>*<>  -  A1c = 5.8%  Blood sugar and A1c are STILL elevated in the borderline and                                                     early or pre-diabetes range which has the same   300% increased risk for heart attack, stroke, cancer and                                        alzheimer- type vascular dementia as full blown diabetes.   But the good news is that diet, exercise with                                                weight loss can cure the early diabetes at this point.  <>*<>*<>*<>*<>*<>*<>*<>*<>*<>*<>*<>*<>*<>*<>*<>*<>*<>*<>*<>*<>*<>*<>*<>*<> <>*<>*<>*<>*<>*<>*<>*<>*<>*<>*<>*<>*<>*<>*<>*<>*<>*<>*<>*<>*<>*<>*<>*<>*<>  -  Insulin = 80 is elevated  ( Normal is less than 20 ) shows insulin resistance                                                                                             - a sign of early diabetes and   associated with a 300 % greater risk for                      heart attacks, strokes, cancer & Alzheimer type vascular dementia   - All this can be cured  and prevented with losing weight   - get Dr Francis Dowse Fuhrman's book 'the End of Diabetes" and  "the End of Dieting"   - and add many years of good health to your life.  <>*<>*<>*<>*<>*<>*<>*<>*<>*<>*<>*<>*<>*<>*<>*<>*<>*<>*<>*<>*<>*<>*<>*<>*<> <>*<>*<>*<>*<>*<>*<>*<>*<>*<>*<>*<>*<>*<>*<>*<>*<>*<>*<>*<>*<>*<>*<>*<>*<>  -  Chol = 170    Excellent   - Very low risk for Heart Attack  / Stroke  <>*<>*<>*<>*<>*<>*<>*<>*<>*<>*<>*<>*<>*<>*<>*<>*<>*<>*<>*<>*<>*<>*<>*<>*<> <>*<>*<>*<>*<>*<>*<>*<>*<>*<>*<>*<>*<>*<>*<>*<>*<>*<>*<>*<>*<>*<>*<>*<>*<>  -  Vitamin D = 76 - Excellent   - Vitamin D goal is between 70-100.   - Please Continue your Vitamin D Same    - It is very important as a natural anti-inflammatory and helping the              immune system protect against viral infections, like the Covid-19, Flu, etc    helping hair, skin, and nails, as well as reducing stroke and heart attack risk.   - It helps your bones and helps with mood.  - It also decreases numerous cancer risks, so please continue  it Same    - Low Vit D is associated with a 200-300% higher risk for CANCER   and 200-300% higher risk for HEART   ATTACK  &  STROKE.    - It is also associated with higher death rate at younger ages,   autoimmune diseases like Rheumatoid arthritis, Lupus, Multiple Sclerosis.     - Also many other serious conditions, like depression, Alzheimer's  Dementia,                                                                             muscle aches, fatigue, fibromyalgia   <>*<>*<>*<>*<>*<>*<>*<>*<>*<>*<>*<>*<>*<>*<>*<>*<>*<>*<>*<>*<>*<>*<>*<>*<> <>*<>*<>*<>*<>*<>*<>*<>*<>*<>*<>*<>*<>*<>*<>*<>*<>*<>*<>*<>*<>*<>*<>*<>*<>  -  All Else - CBC - Kidneys - Electrolytes - Liver - Magnesium & Thyroid    - all  Normal / OK  <>*<>*<>*<>*<>*<>*<>*<>*<>*<>*<>*<>*<>*<>*<>*<>*<>*<>*<>*<>*<>*<>*<>*<>*<> <>*<>*<>*<>*<>*<>*<>*<>*<>*<>*<>*<>*<>*<>*<>*<>*<>*<>*<>*<>*<>*<>*<>*<>*<>

## 2023-04-26 ENCOUNTER — Other Ambulatory Visit: Payer: Self-pay | Admitting: Internal Medicine

## 2023-04-26 ENCOUNTER — Encounter: Payer: Self-pay | Admitting: Internal Medicine

## 2023-04-26 MED ORDER — DEXAMETHASONE 4 MG PO TABS: ORAL_TABLET | ORAL | 0 refills | Status: DC

## 2023-04-28 DIAGNOSIS — I480 Paroxysmal atrial fibrillation: Secondary | ICD-10-CM

## 2023-05-16 ENCOUNTER — Encounter: Payer: 59 | Admitting: Internal Medicine

## 2023-05-23 ENCOUNTER — Ambulatory Visit (INDEPENDENT_AMBULATORY_CARE_PROVIDER_SITE_OTHER): Payer: 59 | Admitting: Internal Medicine

## 2023-05-23 ENCOUNTER — Encounter: Payer: Self-pay | Admitting: Internal Medicine

## 2023-05-23 VITALS — BP 130/78 | HR 71 | Temp 98.0°F | Resp 16 | Ht 72.0 in | Wt 190.2 lb

## 2023-05-23 DIAGNOSIS — B079 Viral wart, unspecified: Secondary | ICD-10-CM

## 2023-05-23 DIAGNOSIS — R0989 Other specified symptoms and signs involving the circulatory and respiratory systems: Secondary | ICD-10-CM | POA: Diagnosis not present

## 2023-05-23 DIAGNOSIS — D485 Neoplasm of uncertain behavior of skin: Secondary | ICD-10-CM | POA: Diagnosis not present

## 2023-05-23 DIAGNOSIS — L723 Sebaceous cyst: Secondary | ICD-10-CM | POA: Diagnosis not present

## 2023-05-23 NOTE — Progress Notes (Signed)
Future Appointments  Date Time Provider Department  05/23/2023  4:00 PM Lucky Cowboy, MD [facility]  07/24/2023  9:30 AM Raynelle Dick, NP [facility]  10/23/2023  2:00 PM Lucky Cowboy, MD [facility]    History of Present Illness:         This very nice 62 y.o. MWM with HTN, pAfib HLD, Pre-Diabetes and Vitamin D Deficiency  presents with concerns for several skin lesions.   He has an apparent approx 15 mm sebaceous cyst over the upper Rt back area, and  a smaller 10 mm similar lesion over the Rt shoulder apex. Also he has an apparent filamentous warty lesion inferior/posterior to the Left ear and lastly a raised 10 x 12 mm  crusty pink lesion over the dorsal mid Rt forearm.    Current Outpatient Medications on File Prior to Visit  Medication Sig   VITAMIN C 1000 MG tablet Take 500 mg  daily.    aspirin 81 MG tablet Take  daily.   ASTELIN nasal spray Place 1-2 sprays nostrils 2  times daily   ZINC  50 mg  Take  daily.   VITAMIN D 1000 UNITS Take 5,000 Units  daily.    VITAMIN B-12 Take 3,000 mcg daily   diltiazem XT 240 MG Take 1 capsule Daily    fish oil-omega-3  1000 MG  Take 2 g  daily.   flecainide  100 MG tablet TAKE 3 TABLETS AS NEEDED FOR  ONSET  OF  AFIB   GARLIC  Take  2  times daily.   LUTEIN  Take  daily.   rosuvastatin 40 MG tablet Take  1 tablet  Daily    TURMERIC  Take daily    No Known Allergies   Problem list He has Gastroesophageal reflux disease; Essential hypertension; Testosterone deficiency; Abnormal glucose; Hyperlipidemia, mixed; Vitamin D deficiency; Medication management; BMI 24.0-24.9, adult; RLS (restless legs syndrome); Paroxysmal atrial fibrillation (HCC); Solitary pulmonary nodule; FHx: heart disease; Lower urinary tract symptoms (LUTS); and Chronic pain of left thumb on their problem list.   Observations/Objective:  BP 130/78   Pulse 71   Temp 98 F (36.7 C)   Resp 16   Ht 6' (1.829 m)   Wt 190 lb 3.2 oz (86.3 kg)   SpO2 97%    BMI 25.80 kg/m   Skin focused exam finds   (1)  15 mm sebaceous cyst over the upper Rt back area (2)  smaller 10 mm similar lesion over the Rt shoulder apex   Procedure  ( CPT :  11402  and 95284 )     After informed consent & aseptic prep , both lesions (1) and (2) wer anesthetized locally with 1 ml Marcaine 0.5% each. The with a # 11 scalpel incisions were made to sharply excise & deliver a 15 mm & 10 mm sebaceous cyst respectively. The 1st incision of the lower mid Rt back was closed with # 5  vertical mattress sutures of Nylon 3-0 . The 2sd wound was closed with #3 vertical mattress sutures of Nylon 3-0.  Sterile dressings were applied & covered with Tegaderm dressings.  Procedure   ( CPT : 17000 and 13244 )   (3)  filamentous warty lesion inferior/posterior to the Left ear  (4)  raised 10 x 12 mm  crusty pink lesion over the dorsal mid Rt forearm.   Lesions (3)  and (4) were each treated by Cryosurgery technique with liquid Nitrogen by triple freeze/thaw technique.  Patient was instructed in post treatment wound care and advised to return in 10 days for suture removal.   Assessment and Plan:   1. Labile hypertension   2. Sebaceous cyst, Rt. mid Back and Rt. Shoulder   3. Viral wart,  Lt. neck    4. Neoplasm of uncertain behavior of skin of Rt. forearm    Follow Up Instructions:        I discussed the assessment and treatment plan with the patient. The patient was provided an opportunity to ask questions and all were answered. The patient agreed with the plan and demonstrated an understanding of the instructions.       The patient was advised to call back or seek an in-person evaluation if the symptoms worsen or if the condition fails to improve as anticipated.    Marinus Maw, MD

## 2023-05-29 ENCOUNTER — Other Ambulatory Visit: Payer: Self-pay | Admitting: Internal Medicine

## 2023-05-29 MED ORDER — FLECAINIDE ACETATE 100 MG PO TABS: ORAL_TABLET | ORAL | 1 refills | Status: DC

## 2023-05-29 NOTE — Progress Notes (Signed)
113 day Zio patch heart monitor does show A fib  . Recommend taking the Flecainide 2 x / day  and the Diltiazem at Bedtime  until your cardiology appointment     as discussed

## 2023-06-01 ENCOUNTER — Ambulatory Visit: Payer: 59 | Admitting: Internal Medicine

## 2023-06-01 ENCOUNTER — Encounter: Payer: Self-pay | Admitting: Internal Medicine

## 2023-06-01 VITALS — BP 138/80 | HR 73 | Temp 98.0°F | Resp 16 | Ht 72.0 in | Wt 193.4 lb

## 2023-06-01 DIAGNOSIS — L723 Sebaceous cyst: Secondary | ICD-10-CM

## 2023-06-01 NOTE — Progress Notes (Signed)
     Future Appointments  Date Time Provider Department  06/01/2023  1:45 PM Lucky Cowboy, MD [facility]  07/17/2023  2:00 PM Christell Constant, MD [facility]  10/23/2023  2:00 PM Lucky Cowboy, MD [facility]    History of Present Illness:     This very nice 62 y.o. MWM returns for suture removal of bx sites of Rt lateral mid back & Rt shoulder apex.   Current Outpatient Medications on File Prior to Visit  Medication Sig   VITAMIN C 1000 MG tablet Take 500 mg  daily.    aspirin 81 MG tablet Take daily.   azelastine (ASTELIN) 0.1 % nasal spray Place 1-2 sprays into both nostrils 2 (two) times daily. Use in each nostril as directed    ZINC 50 mg Take daily.   VITAMIN D   Take 5,000 Units daily.    VITAMIN B-12  Take 3,000 mcg    diltiazem  XT 240 MG 2 Take 1 capsule Daily with Supper for BP & Heart Rhythm   fish oil-omega-3   1000 MG capsule Take 2 g daily.   flecainide  100 MG tablet Take 1 tablet  2 to 3 x / day  for A Fib   GARLIC  Take  2  times daily.   LUTEIN  Take  daily.   rosuvastatin  40 MG tablet Take  1 tablet  Daily    TURMERIC  Take     No Known Allergies   Problem list He has Gastroesophageal reflux disease; Essential hypertension; Testosterone deficiency; Abnormal glucose; Hyperlipidemia, mixed; Vitamin D deficiency; Medication management; BMI 24.0-24.9, adult; RLS (restless legs syndrome); Paroxysmal atrial fibrillation (HCC); Solitary pulmonary nodule; FHx: heart disease; Lower urinary tract symptoms (LUTS); and Chronic pain of left thumb on their problem list.   Observations/Objective:  BP 138/80   Pulse 73   Temp 98 F (36.7 C)   Resp 16   Ht 6' (1.829 m)   Wt 193 lb 6.4 oz (87.7 kg)   SpO2 98%   BMI 26.23 kg/m   Skin focused exam of Rt lateral mid back & Rt shoulder finds both wounds well healed w/o signs of infection & sutures removed .  Assessment and Plan:   1. Sebaceous cyst   Follow Up Instructions:        The patient  was advised to call back or seek an in-person evaluation  if problems.    Marinus Maw, MD

## 2023-07-03 ENCOUNTER — Other Ambulatory Visit: Payer: Self-pay | Admitting: Internal Medicine

## 2023-07-03 ENCOUNTER — Encounter: Payer: Self-pay | Admitting: Internal Medicine

## 2023-07-03 DIAGNOSIS — B9789 Other viral agents as the cause of diseases classified elsewhere: Secondary | ICD-10-CM

## 2023-07-03 MED ORDER — AZELASTINE HCL 0.1 % NA SOLN: 1.0000 | Freq: Two times a day (BID) | NASAL | 3 refills | Status: DC

## 2023-07-17 ENCOUNTER — Ambulatory Visit: Payer: 59 | Attending: Internal Medicine | Admitting: Internal Medicine

## 2023-07-17 ENCOUNTER — Encounter: Payer: Self-pay | Admitting: Internal Medicine

## 2023-07-17 VITALS — BP 122/68 | HR 67 | Ht 72.0 in | Wt 192.0 lb

## 2023-07-17 DIAGNOSIS — I480 Paroxysmal atrial fibrillation: Secondary | ICD-10-CM

## 2023-07-17 DIAGNOSIS — E782 Mixed hyperlipidemia: Secondary | ICD-10-CM

## 2023-07-17 MED ORDER — FLECAINIDE ACETATE 50 MG PO TABS: 50.0000 mg | ORAL_TABLET | Freq: Two times a day (BID) | ORAL | 3 refills | Status: DC

## 2023-07-17 NOTE — Patient Instructions (Signed)
Medication Instructions:  Your physician has recommended you make the following change in your medication:  START: Flecainide 50 mg by mouth twice daily  *If you need a refill on your cardiac medications before your next appointment, please call your pharmacy*   Lab Work: NONE  If you have labs (blood work) drawn today and your tests are completely normal, you will receive your results only by: MyChart Message (if you have MyChart) OR A paper copy in the mail If you have any lab test that is abnormal or we need to change your treatment, we will call you to review the results.   Testing/Procedures: Your physician would like you to have a Nurse visit in 1 week for an EKG.  Your physician has referred you to see an Electrophysiologist (EP) Provider.   Your physician has requested that you have an echocardiogram. Echocardiography is a painless test that uses sound waves to create images of your heart. It provides your doctor with information about the size and shape of your heart and how well your heart's chambers and valves are working. This procedure takes approximately one hour. There are no restrictions for this procedure. Please do NOT wear cologne, perfume, aftershave, or lotions (deodorant is allowed). Please arrive 15 minutes prior to your appointment time.  Please note: We ask at that you not bring children with you during ultrasound (echo/ vascular) testing. Due to room size and safety concerns, children are not allowed in the ultrasound rooms during exams. Our front office staff cannot provide observation of children in our lobby area while testing is being conducted. An adult accompanying a patient to their appointment will only be allowed in the ultrasound room at the discretion of the ultrasound technician under special circumstances. We apologize for any inconvenience.  Your physician has requested that you have a stress echocardiogram. For further information please visit  [email or web address] Please follow instruction sheet as given.  Please note: We ask at that you not bring children with you during ultrasound (echo/ vascular) testing. Due to room size and safety concerns, children are not allowed in the ultrasound rooms during exams. Our front office staff cannot provide observation of children in our lobby area while testing is being conducted. An adult accompanying a patient to their appointment will only be allowed in the ultrasound room at the discretion of the ultrasound technician under special circumstances. We apologize for any inconvenience.    Follow-Up:As needed At [facility], you and your health needs are our priority.  As part of our continuing mission to provide you with exceptional heart care, we have created designated Provider Care Teams.  These Care Teams include your primary Cardiologist (physician) and Advanced Practice Providers (APPs -  Physician Assistants and Nurse Practitioners) who all work together to provide you with the care you need, when you need it.   Provider:   Riley Lam, MD

## 2023-07-17 NOTE — Progress Notes (Signed)
Cardiology Office Note:  .    Date:  07/17/2023  ID:  Jack Russell, DOB [birth year], MRN [id number] PCP: Lucky Cowboy, MD  [facility] Providers Cardiologist:  None     CC: PAF Consulted for the evaluation of PAF at the behest of   History of Present Illness: .    Jack Russell, a 62 year old with a history of paroxysmal atrial fibrillation and hyperlipidemia, presents for reestablishment of care. The patient reports experiencing episodes of atrial fibrillation, which have been increasing in frequency over the past two to three years. These episodes typically occur at night, often after days of overexertion or excessive sweating. However, recent episodes have been more random, occurring without any identifiable triggers.  When these episodes occur, the patient can identify the onset and has been managing them with a 'pill in the pocket' approach, taking three tablets of flecainide. This strategy typically resolves the episode within three hours. Despite this, the patient expresses concern about the increasing frequency of these episodes and is interested in exploring other treatment options, including cardioablation.  The patient's atrial fibrillation is reportedly associated with fatigue but is not perceived as a significant burden. The patient has no other significant medical problems and is currently on a regimen of flecainide, diltiazem, and a statin for hyperlipidemia. The patient has previously been monitored for this condition, with a heart monitor in 2014 showing a 5% burden of atrial fibrillation and some non-sustained ventricular tachycardia.  Relevant histories: .  Social former  ROS: As per HPI.   Studies Reviewed: .   Cardiac Studies & Procedures      ECHOCARDIOGRAM  ECHOCARDIOGRAM COMPLETE 03/17/2017  Narrative *[facility]* [address] [place], [place]  [address] [phone]  ------------------------------------------------------------------- Transthoracic Echocardiography  Patient:    Jack Russell, Jack Russell MR #:       [id number] Study Date: 03/17/2017 Gender:     M Age:        45 Height:     185.2 cm Weight:     88.6 kg BSA:        2.14 m^2 Pt. Status: Room:  ATTENDING    Hillis Range, MD ORDERING     Hillis Range, MD REFERRING    Hillis Range, MD SONOGRAPHER  Dewitt Hoes, RDCS PERFORMING   [facility], Outpatient  cc:  ------------------------------------------------------------------- LV EF: 55% -   60%  ------------------------------------------------------------------- Indications:      Atrial fibrillation (I48).  ------------------------------------------------------------------- History:   PMH:   Atrial fibrillation.  Risk factors: Pre-diabetes. Hypertension.  ------------------------------------------------------------------- Study Conclusions  - Left ventricle: The cavity size was normal. Systolic function was normal. The estimated ejection fraction was in the range of 55% to 60%. Wall motion was normal; there were no regional wall motion abnormalities. Left ventricular diastolic function parameters were normal. - Atrial septum: No defect or patent foramen ovale was identified.  ------------------------------------------------------------------- Study data:  No prior study was available for comparison.  Study status:  Routine.  Procedure:  The patient reported no pain pre or post test. Transthoracic echocardiography. Image quality was adequate.  Study completion:  There were no complications. Transthoracic echocardiography.  M-mode, complete 2D, 3D, spectral Doppler, and color Doppler.  Birthdate:  Patient birthdate: 1961/02/03.  Age:  Patient is 62 yr old.  Sex:  Gender: male. BMI: 25.8 kg/m^2.  Blood pressure:     120/82  Patient status: Outpatient.  Study date:  Study date: 03/17/2017. Study time: 08:21 AM.   Location:  [facility]  -------------------------------------------------------------------  ------------------------------------------------------------------- Left ventricle:  The cavity size was normal. Systolic function was normal. The estimated ejection fraction was in the range of 55% to 60%. Wall motion was normal; there were no regional wall motion abnormalities. The transmitral flow pattern was normal. The deceleration time of the early transmitral flow velocity was normal. The pulmonary vein flow pattern was normal. The tissue Doppler parameters were normal. Left ventricular diastolic function parameters were normal.  ------------------------------------------------------------------- Aortic valve:   Trileaflet; normal thickness leaflets. Mobility was not restricted.  Doppler:  Transvalvular velocity was within the normal range. There was no stenosis. There was no regurgitation.  ------------------------------------------------------------------- Aorta:  The aorta was normal, not dilated, and non-diseased. Aortic root: The aortic root was normal in size.  ------------------------------------------------------------------- Mitral valve:   Mildly thickened leaflets . Mobility was not restricted.  Doppler:  Transvalvular velocity was within the normal range. There was no evidence for stenosis. There was trivial regurgitation.  ------------------------------------------------------------------- Left atrium:  The atrium was normal in size.  ------------------------------------------------------------------- Atrial septum:  No defect or patent foramen ovale was identified.  ------------------------------------------------------------------- Right ventricle:  The cavity size was normal. Wall thickness was normal. Systolic function was normal.  ------------------------------------------------------------------- Pulmonic valve:    Doppler:  Transvalvular velocity was  within the normal range. There was no evidence for stenosis. There was trivial regurgitation.  ------------------------------------------------------------------- Tricuspid valve:   Structurally normal valve.    Doppler: Transvalvular velocity was within the normal range. There was trivial regurgitation.  ------------------------------------------------------------------- Pulmonary artery:   The main pulmonary artery was normal-sized. Systolic pressure was within the normal range.  ------------------------------------------------------------------- Right atrium:  The atrium was normal in size.  ------------------------------------------------------------------- Pericardium:  The pericardium was normal in appearance. There was no pericardial effusion.  ------------------------------------------------------------------- Systemic veins: Inferior vena cava: The vessel was normal in size. The respirophasic diameter changes were in the normal range (>= 50%), consistent with normal central venous pressure.  ------------------------------------------------------------------- Post procedure conclusions Ascending Aorta:  - The aorta was normal, not dilated, and non-diseased.  ------------------------------------------------------------------- Measurements  Left ventricle                         Value        Reference LV ID, ED, PLAX chordal                51.5  mm     43 - 52 LV ID, ES, PLAX chordal                33.7  mm     23 - 38 LV fx shortening, PLAX chordal         35    %      >=29 LV PW thickness, ED                    8.96  mm     --------- IVS/LV PW ratio, ED                    0.86         <=1.3 Stroke volume, 2D                      62    ml     --------- Stroke volume/bsa, 2D                  29    ml/m^2 --------- LV e&', lateral  8.74  cm/s   --------- LV E/e&', lateral                       6.04         --------- LV e&', medial                           6.12  cm/s   --------- LV E/e&', medial                        8.63         --------- LV e&', average                         7.43  cm/s   --------- LV E/e&', average                       7.11         ---------  Ventricular septum                     Value        Reference IVS thickness, ED                      7.74  mm     ---------  LVOT                                   Value        Reference LVOT ID, S                             21    mm     --------- LVOT area                              3.46  cm^2   --------- LVOT peak velocity, S                  82.5  cm/s   --------- LVOT mean velocity, S                  54.5  cm/s   --------- LVOT VTI, S                            17.9  cm     ---------  Aorta                                  Value        Reference Aortic root ID, ED                     36    mm     --------- Ascending aorta ID, A-P, S             29    mm     ---------  Left atrium                            Value  Reference LA ID, A-P, ES                         32    mm     --------- LA ID/bsa, A-P                         1.49  cm/m^2 <=2.2 LA volume, S                           28    ml     --------- LA volume/bsa, S                       13.1  ml/m^2 --------- LA volume, ES, 1-p A4C                 22.2  ml     --------- LA volume/bsa, ES, 1-p A4C             10.4  ml/m^2 --------- LA volume, ES, 1-p A2C                 34.1  ml     --------- LA volume/bsa, ES, 1-p A2C             15.9  ml/m^2 ---------  Mitral valve                           Value        Reference Mitral E-wave peak velocity            52.8  cm/s   --------- Mitral A-wave peak velocity            46.6  cm/s   --------- Mitral deceleration time               187   ms     150 - 230 Mitral E/A ratio, peak                 1.1          ---------  Systemic veins                         Value        Reference Estimated CVP                          3     mm Hg  ---------  Right  ventricle                        Value        Reference TAPSE                                  18.9  mm     --------- RV s&', lateral, S                      10.3  cm/s   ---------  Legend: (L)  and  (H)  mark values outside specified reference range.  ------------------------------------------------------------------- Prepared and Electronically Authenticated by  Charlton Haws, M.D. 2018-08-24T09:13:45   MONITORS  LONG TERM MONITOR (3-14 DAYS) 05/23/2023  Narrative Indication:atrial fibrillation  Duration:  13.25d  Findings HR  avg 70  Min 41-Max 144 PVCs Rare, less than 1% PACs Rare, less than 1% SVT Nonsustained 22 episodes; fastest 20. bpm for 8 beats; longest for 11.3 secs at 152 bpm Atrial fibrillation burder 5 % longest episode 12h28m  Symptoms: Assoc with onset of atrial fibrillation >>  Triggered: Assoc with afib but also isolated ectopy  Conclusions: Atrial fibrillation   Recommendations Cardiology referral           Physical Exam:    VS:  BP 122/68 (BP Location: Left Arm)   Pulse 67   Ht 6' (1.829 m)   Wt 192 lb (87.1 kg)   SpO2 97%   BMI 26.04 kg/m    Wt Readings from Last 3 Encounters:  07/17/23 192 lb (87.1 kg)  06/01/23 193 lb 6.4 oz (87.7 kg)  05/23/23 190 lb 3.2 oz (86.3 kg)    Gen: no distress  Neck: No JVD Cardiac: No Rubs or Gallops, no murmur, RRR +2 radial pulses Respiratory: Clear to auscultation bilaterally, normal effort, normal  respiratory rate GI: Soft, nontender, non-distended  MS: No  edema;  moves all extremities Integument: Skin feels warm Neuro:  At time of evaluation, alert and oriented to person/place/time/situation  Psych: Normal affect, patient feels well   ASSESSMENT AND PLAN: .    Paroxysmal Atrial Fibrillation Paroxysmal atrial fibrillation with a 5% burden noted on a heart monitor since 2014. Recent episodes occur randomly, often at night, with symptoms including fatigue. Discussed ablation, including  procedure details, success rates, and post-procedure medication. Flecainide as a standing medication requires concurrent diltiazem to prevent orthodromic conduction. - Refer to electrophysiologist for ablation; CHADSVASC 0 with increased burden who would not like to try meds - Order echocardiogram - Prescribe flecainide 50 mg PO BID - Continue diltiazem - Allow 'pill in the pocket' approach with flecainide for acute episodes - Order EKG in one week to follow up on flecainide - Order exercise stress echocardiogram to evaluate for ischemia and assess flecainide response  Hyperlipidemia - Mildly elevated cholesterol levels managed with a statin.  Follow-up - Follow up if  with me if  high coronary calcium burden is detected on CT - No routine follow-up with cardiologist if ablation is successful and no significant coronary artery calcification is found (EP only)   Riley Lam, MD FASE [facility] Cardiologist [facility]  [address] [place], [place] [address] [phone]  2:58 PM

## 2023-07-24 ENCOUNTER — Ambulatory Visit: Payer: 59 | Attending: Cardiovascular Disease

## 2023-07-24 ENCOUNTER — Ambulatory Visit: Payer: 59 | Admitting: Nurse Practitioner

## 2023-07-24 VITALS — BP 120/70 | HR 70 | Ht 72.0 in | Wt 185.2 lb

## 2023-07-24 DIAGNOSIS — I480 Paroxysmal atrial fibrillation: Secondary | ICD-10-CM

## 2023-07-24 NOTE — Patient Instructions (Signed)
Medication Instructions:  Your physician recommends that you continue on your current medications as directed. Please refer to the Current Medication list given to you today.  *If you need a refill on your cardiac medications before your next appointment, please call your pharmacy*  Follow-Up: Keep appointments as scheduled.

## 2023-07-24 NOTE — Progress Notes (Signed)
   Nurse Visit   Date of Encounter: 07/24/2023 ID: Jack Russell, DOB Mar 20, 1961, MRN [id number]  PCP:  Lucky Cowboy, MD   [facility] Providers Cardiologist:  None      Visit Details   VS:  BP 120/70 (BP Location: Right Arm, Patient Position: Sitting, Cuff Size: Normal)   Pulse 70   Ht 6' (1.829 m)   Wt 185 lb 3.2 oz (84 kg)   BMI 25.12 kg/m  , BMI Body mass index is 25.12 kg/m.  Wt Readings from Last 3 Encounters:  07/24/23 185 lb 3.2 oz (84 kg)  07/17/23 192 lb (87.1 kg)  06/01/23 193 lb 6.4 oz (87.7 kg)     Reason for visit: EKG after flecainide start Performed today: Vitals, EKG, Provider consulted:Dr. Mealor (DOD), and Education Changes (medications, testing, etc.) : None Length of Visit: 20 minutes  Patient see for nurse visit today for EKG following start of flecainide 50mg  BID a week ago. Patient denies any recent symptoms of A-Fib, no complaints, states he has been tolerating dose well. He has not needed extra dose of flecainide. EKG reviewed by DOD, Dr. Clide Deutscher, no changes to current treatment plan.  Medications Adjustments/Labs and Tests Ordered: Orders Placed This Encounter  Procedures   EKG 12-Lead   No orders of the defined types were placed in this encounter.    Signed, Franchot Gallo, RN  07/24/2023 11:46 AM

## 2023-08-15 ENCOUNTER — Telehealth: Payer: Self-pay | Admitting: Internal Medicine

## 2023-08-15 NOTE — Telephone Encounter (Signed)
Pt is requesting a callback regarding him being scheduled for an ECHO on Friday but noticed insurance still hasn't approved it so he'd like to be advised on what to do. Please advise.

## 2023-08-15 NOTE — Telephone Encounter (Signed)
Called pt advised to contact insurance company or contact our billing department to further address concern.  Pt reports wife works for [employer] and said Echo was not approved when checked.  Gave pt the number for our billing department.  Will also send this call for f/u.

## 2023-08-18 ENCOUNTER — Encounter: Payer: Self-pay | Admitting: Internal Medicine

## 2023-08-18 ENCOUNTER — Ambulatory Visit (HOSPITAL_COMMUNITY): Payer: 59 | Attending: Internal Medicine

## 2023-08-18 DIAGNOSIS — I480 Paroxysmal atrial fibrillation: Secondary | ICD-10-CM | POA: Insufficient documentation

## 2023-08-18 LAB — ECHOCARDIOGRAM COMPLETE
Area-P 1/2: 4.1 cm2
S' Lateral: 2.8 cm

## 2023-08-25 ENCOUNTER — Telehealth (HOSPITAL_COMMUNITY): Payer: Self-pay | Admitting: *Deleted

## 2023-08-25 NOTE — Telephone Encounter (Signed)
Spoke with patient and gave instructions about his STRESS ECHO on 09/01/23  at 2:30.

## 2023-09-01 ENCOUNTER — Ambulatory Visit (HOSPITAL_COMMUNITY): Payer: 59 | Attending: Internal Medicine

## 2023-09-01 ENCOUNTER — Ambulatory Visit (HOSPITAL_COMMUNITY): Payer: 59

## 2023-09-01 DIAGNOSIS — I480 Paroxysmal atrial fibrillation: Secondary | ICD-10-CM | POA: Diagnosis present

## 2023-09-01 MED ORDER — PERFLUTREN LIPID MICROSPHERE
1.0000 mL | INTRAVENOUS | Status: AC | PRN
  Administered 2023-09-01: 2 mL via INTRAVENOUS
  Administered 2023-09-01: 3 mL via INTRAVENOUS
  Administered 2023-09-01: 2 mL via INTRAVENOUS

## 2023-09-05 NOTE — Progress Notes (Unsigned)
Electrophysiology Office Note:    Date:  09/06/2023   ID:  Jack Russell, DOB 03-25-1961, MRN [id number]  [facility] Cardiologist:  None  [facility] Electrophysiologist:  Lanier Prude, MD   Referring MD: Christell Constant*   Chief Complaint: Atrial fibrillation  History of Present Illness:    Jack Russell is a 63 year old man who I am seeing today for an evaluation of atrial fibrillation at the request of Dr. Izora Ribas.  The patient has a history of atrial fibrillation, hyperlipidemia.  He last saw Dr. Izora Ribas July 17, 2023.  At that appointment he reported increasing burden of atrial fibrillation over the last 2 to 3 years.  He has been taking pill in the pocket flecainide in the past which typically resolves the episode within 3 hours.  He brings in a chart today of his A-fib episodes.  He has multiple episodes per month, sometimes per week.  They are highly symptomatic.  At times they have led to him feeling presyncopal.  He has been taking as needed flecainide, up to 300 mg additional dosing.  He is on 50 twice daily right now.  He reliably takes diltiazem.  He is with his wife in clinic.     Their past medical, social and family history was reviewed.   ROS:   Please see the history of present illness.    All other systems reviewed and are negative.  EKGs/Labs/Other Studies Reviewed:    The following studies were reviewed today:  September 01, 2023 stress echo No ischemic findings EF 60% No significant valvular abnormalities  July 24, 2023 EKG shows sinus rhythm.  PR 166.  QRS duration 98 ms. EKG Interpretation Date/Time:  Wednesday September 06 2023 14:11:03 EST Ventricular Rate:  58 PR Interval:  162 QRS Duration:  96 QT Interval:  426 QTC Calculation: 418 R Axis:   21  Text Interpretation: Sinus bradycardia Confirmed by Steffanie Dunn [phone]) on 09/06/2023 2:36:37 PM    Physical Exam:    VS:  BP 132/76   Pulse 62   Ht 6'  (1.829 m)   Wt 196 lb 12.8 oz (89.3 kg)   SpO2 98%   BMI 26.69 kg/m     Wt Readings from Last 3 Encounters:  09/06/23 196 lb 12.8 oz (89.3 kg)  07/24/23 185 lb 3.2 oz (84 kg)  07/17/23 192 lb (87.1 kg)     GEN: no distress CARD: RRR, No MRG RESP: No IWOB. CTAB.        ASSESSMENT AND PLAN:    1. Paroxysmal A-fib (HCC)   2. Encounter for long-term (current) use of high-risk medication     #Paroxysmal atrial fibrillation #High risk drug monitoring-flecainide Highly symptomatic.  Previously managed with flecainide but has noticed an increased burden of symptomatic atrial fibrillation despite treatment.  I discussed treatment options with the patient including alternative antiarrhythmic drugs and catheter ablation.  He would like to proceed with catheter ablation which I think is very reasonable.  He will need to start Eliquis 4 weeks prior to the procedure with plans to continue it for at least 3 months after the ablation procedure.  When he starts the Eliquis he will stop the aspirin.  Discussed treatment options today for AF including antiarrhythmic drug therapy and ablation. Discussed risks, recovery and likelihood of success with each treatment strategy. Risk, benefits, and alternatives to EP study and ablation for afib were discussed. These risks include but are not limited to stroke, bleeding, vascular damage, tamponade,  perforation, damage to the esophagus, lungs, phrenic nerve and other structures, pulmonary vein stenosis, worsening renal function, coronary vasospasm and death.  Discussed potential need for repeat ablation procedures and antiarrhythmic drugs after an initial ablation. The patient understands these risk and wishes to proceed.  We will therefore proceed with catheter ablation at the next available time.  Carto, ICE, anesthesia are requested for the procedure.  Will also obtain CT PV protocol prior to the procedure to exclude LAA thrombus and further evaluate atrial  anatomy.  PR and QRS durations acceptable for ongoing flecainide use on today's EKG.  I am and have him increase his flecainide to 100 mg by mouth twice daily.  He will take that in addition to his diltiazem.  He will stop taking as needed flecainide.   Signed, Rossie Muskrat. Lalla Brothers, MD, [facility], [facility] 09/06/2023 2:52 PM    Electrophysiology [facility]

## 2023-09-06 ENCOUNTER — Other Ambulatory Visit: Payer: Self-pay

## 2023-09-06 ENCOUNTER — Ambulatory Visit: Payer: 59 | Attending: Cardiology | Admitting: Cardiology

## 2023-09-06 VITALS — BP 132/76 | HR 62 | Ht 72.0 in | Wt 196.8 lb

## 2023-09-06 DIAGNOSIS — I480 Paroxysmal atrial fibrillation: Secondary | ICD-10-CM

## 2023-09-06 DIAGNOSIS — Z79899 Other long term (current) drug therapy: Secondary | ICD-10-CM

## 2023-09-06 MED ORDER — FLECAINIDE ACETATE 100 MG PO TABS: 100.0000 mg | ORAL_TABLET | Freq: Two times a day (BID) | ORAL | 3 refills | Status: DC

## 2023-09-06 NOTE — Patient Instructions (Addendum)
Medication Instructions:  Your physician has recommended you make the following change in your medication: 1) CHANGE flecainide to 100 mg twice daily 2) On Monday, February 24th START taking Eliquis 5 mg twice daily and STOP taking Aspirin *If you need a refill on your cardiac medications before your next appointment, please call your pharmacy*  Lab Work: BMET and CBC - please go to any [facility] location to have these drawn after February 24th  Testing/Procedures: Cardiac CT  Your physician has requested that you have cardiac CT. Cardiac computed tomography (CT) is a painless test that uses an x-ray machine to take clear, detailed pictures of your heart. For further information please visit [email or web address]  We will call you to schedule your CT scan. It will be done about three weeks prior to your ablation.  Ablation Your physician has recommended that you have an ablation. Catheter ablation is a medical procedure used to treat some cardiac arrhythmias (irregular heartbeats). During catheter ablation, a long, thin, flexible tube is put into a blood vessel in your groin (upper thigh), or neck. This tube is called an ablation catheter. It is then guided to your heart through the blood vessel. Radio frequency waves destroy small areas of heart tissue where abnormal heartbeats may cause an arrhythmia to start.  You are scheduled for Atrial Fibrillation Ablation on Monday, March 24 with Dr. Steffanie Dunn.Please arrive at the Main Entrance A at [facility]: [address] [place], [place] [address] at 5:30 AM    Follow-Up: At [facility], you and your health needs are our priority.  As part of our continuing mission to provide you with exceptional heart care, we have created designated Provider Care Teams.  These Care Teams include your primary Cardiologist (physician) and Advanced Practice Providers (APPs -  Physician Assistants and Nurse Practitioners) who all work together to  provide you with the care you need, when you need it.  We will call you to schedule your follow up appointments

## 2023-09-12 ENCOUNTER — Other Ambulatory Visit: Payer: Self-pay

## 2023-09-12 MED ORDER — APIXABAN 5 MG PO TABS: 5.0000 mg | ORAL_TABLET | Freq: Two times a day (BID) | ORAL | 1 refills | Status: DC

## 2023-09-19 ENCOUNTER — Telehealth (HOSPITAL_COMMUNITY): Payer: Self-pay

## 2023-09-19 NOTE — Telephone Encounter (Signed)
 Spoke with patient to complete one month pre-procedure call.     New medical conditions?  No Recent hospitalizations or surgeries? No Started any new medications? Eliquis Patient made aware to contact office to inform of any new medications started. Any changes in activities of daily living? NO  Pre-procedure testing scheduled: CT on 09/29/23 and lab work ordered. Confirmed patient is taking Eliquis and will continue taking medication before procedure or it may need to be rescheduled.  Confirmed patient is scheduled for Atrial Fibrillation Ablation on Monday, March 24 with Dr. Steffanie Dunn. Instructed patient to arrive at the Main Entrance A at [facility]: [address] [place], [place] [address] and check in at Admitting at 5:30 AM  Advised of plan to go home the same day and will only stay overnight if medically necessary. You MUST have a responsible adult to drive you home and MUST be with you the first 24 hours after you arrive home or your procedure could be cancelled.  Patient verbalized understanding to information provided and is agreeable to proceed with procedure.

## 2023-09-29 ENCOUNTER — Ambulatory Visit (HOSPITAL_COMMUNITY)
Admission: RE | Admit: 2023-09-29 | Discharge: 2023-09-29 | Disposition: A | Discharge status: Home / Self Care | Payer: 59 | Source: Ambulatory Visit | Attending: Cardiology | Admitting: Cardiology

## 2023-09-29 DIAGNOSIS — I480 Paroxysmal atrial fibrillation: Secondary | ICD-10-CM | POA: Diagnosis present

## 2023-09-29 DIAGNOSIS — Z79899 Other long term (current) drug therapy: Secondary | ICD-10-CM | POA: Diagnosis present

## 2023-09-29 MED ORDER — IOHEXOL 350 MG/ML SOLN
95.0000 mL | Freq: Once | INTRAVENOUS | Status: AC | PRN
  Administered 2023-09-29: 95 mL via INTRAVENOUS

## 2023-10-09 ENCOUNTER — Telehealth (HOSPITAL_COMMUNITY): Payer: Self-pay

## 2023-10-09 NOTE — Telephone Encounter (Signed)
 Call placed to patient to discuss upcoming procedure.   CT: completed.  Labs: to be drawn 10/09/23.   Any recent signs of acute illness or been started on antibiotics? No Any new medications started? No Any medications to hold?  No Any missed doses of blood thinner? No Advised patient to continue taking ANTICOAGULANT: Eliquis (Apixaban) without missing any doses.  Medication instructions:  On the morning of your procedure DO NOT take any medication., including Eliquis or the procedure may be rescheduled. Nothing to eat or drink after midnight prior to your procedure.  Confirmed patient is scheduled for Atrial Fibrillation Ablation on Monday, March 24 with Dr. Steffanie Dunn. Instructed patient to arrive at the Main Entrance A at [facility]: [address] [place], [place] [address] and check in at Admitting at 5:30 AM  Advised of plan to go home the same day and will only stay overnight if medically necessary. You MUST have a responsible adult to drive you home and MUST be with you the first 24 hours after you arrive home or your procedure could be cancelled.  Patient verbalized understanding to all instructions provided and agreed to proceed with procedure.

## 2023-10-10 LAB — BASIC METABOLIC PANEL
BUN/Creatinine Ratio: 14 (ref 10–24)
BUN: 14 mg/dL (ref 8–27)
CO2: 24 mmol/L (ref 20–29)
Calcium: 9.4 mg/dL (ref 8.6–10.2)
Chloride: 102 mmol/L (ref 96–106)
Creatinine, Ser: 1 mg/dL (ref 0.76–1.27)
Glucose: 127 mg/dL — ABNORMAL HIGH (ref 70–99)
Potassium: 4.8 mmol/L (ref 3.5–5.2)
Sodium: 140 mmol/L (ref 134–144)
eGFR: 84 mL/min/{1.73_m2} (ref >59)

## 2023-10-10 LAB — CBC
Hematocrit: 46.1 % (ref 37.5–51.0)
Hemoglobin: 15.6 g/dL (ref 13.0–17.7)
MCH: 30.4 pg (ref 26.6–33.0)
MCHC: 33.8 g/dL (ref 31.5–35.7)
MCV: 90 fL (ref 79–97)
Platelets: 268 10*3/uL (ref 150–450)
RBC: 5.13 x10E6/uL (ref 4.14–5.80)
RDW: 12.7 % (ref 11.6–15.4)
WBC: 5.4 10*3/uL (ref 3.4–10.8)

## 2023-10-12 ENCOUNTER — Other Ambulatory Visit: Payer: Self-pay

## 2023-10-12 DIAGNOSIS — I480 Paroxysmal atrial fibrillation: Secondary | ICD-10-CM

## 2023-10-12 DIAGNOSIS — R0989 Other specified symptoms and signs involving the circulatory and respiratory systems: Secondary | ICD-10-CM

## 2023-10-12 MED ORDER — DILTIAZEM HCL ER COATED BEADS 240 MG PO CP24: ORAL_CAPSULE | ORAL | 0 refills | Status: DC

## 2023-10-13 NOTE — Pre-Procedure Instructions (Signed)
 Instructed patient on the following items: Arrival time 0515 Nothing to eat or drink after midnight No meds AM of procedure Responsible person to drive you home and stay with you for 24 hrs  Have you missed any doses of anti-coagulant Eliquis- takes twice a day, hasn't missed any dose in last 4 weeks.  Don't take dose morning of procedure.

## 2023-10-15 NOTE — Anesthesia Preprocedure Evaluation (Signed)
 Anesthesia Evaluation  Patient identified by MRN, date of birth, ID band Patient awake    Reviewed: Allergy & Precautions, NPO status , Patient's Chart, lab work & pertinent test results  History of Anesthesia Complications Negative for: history of anesthetic complications  Airway Mallampati: II  TM Distance: >3 FB Neck ROM: Full    Dental no notable dental hx.    Pulmonary neg pulmonary ROS   Pulmonary exam normal        Cardiovascular hypertension, Pt. on medications Normal cardiovascular exam+ dysrhythmias Atrial Fibrillation      Neuro/Psych negative neurological ROS  negative psych ROS   GI/Hepatic Neg liver ROS,GERD  ,,  Endo/Other  negative endocrine ROS    Renal/GU negative Renal ROS  negative genitourinary   Musculoskeletal  (+) Arthritis ,    Abdominal   Peds  Hematology negative hematology ROS (+)   Anesthesia Other Findings Day of surgery medications reviewed with patient.  Reproductive/Obstetrics negative OB ROS                              Anesthesia Physical Anesthesia Plan  ASA: 3  Anesthesia Plan: General   Post-op Pain Management: Minimal or no pain anticipated   Induction: Intravenous  PONV Risk Score and Plan: 2 and Midazolam, Treatment may vary due to age or medical condition, Dexamethasone and Ondansetron  Airway Management Planned: Oral ETT  Additional Equipment: None  Intra-op Plan:   Post-operative Plan: Extubation in OR  Informed Consent: I have reviewed the patients History and Physical, chart, labs and discussed the procedure including the risks, benefits and alternatives for the proposed anesthesia with the patient or authorized representative who has indicated his/her understanding and acceptance.     Dental advisory given  Plan Discussed with: CRNA  Anesthesia Plan Comments:         Anesthesia Quick Evaluation

## 2023-10-16 ENCOUNTER — Ambulatory Visit (HOSPITAL_BASED_OUTPATIENT_CLINIC_OR_DEPARTMENT_OTHER): Payer: Self-pay | Admitting: Certified Registered"

## 2023-10-16 ENCOUNTER — Other Ambulatory Visit: Payer: Self-pay

## 2023-10-16 ENCOUNTER — Ambulatory Visit (HOSPITAL_COMMUNITY)
Admission: RE | Disposition: A | Discharge status: Home / Self Care | Payer: Self-pay | Source: Home / Self Care | Attending: Cardiology

## 2023-10-16 ENCOUNTER — Other Ambulatory Visit (HOSPITAL_COMMUNITY): Payer: Self-pay

## 2023-10-16 ENCOUNTER — Ambulatory Visit (HOSPITAL_COMMUNITY): Payer: Self-pay | Admitting: Certified Registered"

## 2023-10-16 ENCOUNTER — Encounter (HOSPITAL_COMMUNITY): Payer: Self-pay | Admitting: Cardiology

## 2023-10-16 ENCOUNTER — Ambulatory Visit (HOSPITAL_COMMUNITY)
Admission: RE | Admit: 2023-10-16 | Discharge: 2023-10-16 | Disposition: A | Discharge status: Home / Self Care | Payer: 59 | Attending: Cardiology | Admitting: Cardiology

## 2023-10-16 DIAGNOSIS — I10 Essential (primary) hypertension: Secondary | ICD-10-CM

## 2023-10-16 DIAGNOSIS — I4891 Unspecified atrial fibrillation: Secondary | ICD-10-CM

## 2023-10-16 DIAGNOSIS — E785 Hyperlipidemia, unspecified: Secondary | ICD-10-CM

## 2023-10-16 DIAGNOSIS — I480 Paroxysmal atrial fibrillation: Secondary | ICD-10-CM | POA: Insufficient documentation

## 2023-10-16 DIAGNOSIS — Z79899 Other long term (current) drug therapy: Secondary | ICD-10-CM | POA: Diagnosis not present

## 2023-10-16 DIAGNOSIS — Z7901 Long term (current) use of anticoagulants: Secondary | ICD-10-CM | POA: Diagnosis not present

## 2023-10-16 DIAGNOSIS — M1990 Unspecified osteoarthritis, unspecified site: Secondary | ICD-10-CM | POA: Insufficient documentation

## 2023-10-16 HISTORY — PX: ATRIAL FIBRILLATION ABLATION: EP1191

## 2023-10-16 LAB — POCT ACTIVATED CLOTTING TIME: Activated Clotting Time: 285 s

## 2023-10-16 SURGERY — ATRIAL FIBRILLATION ABLATION
Anesthesia: General

## 2023-10-16 MED ORDER — PANTOPRAZOLE SODIUM 40 MG PO TBEC
40.0000 mg | DELAYED_RELEASE_TABLET | Freq: Every day | ORAL | 0 refills | Status: DC
  Filled 2023-10-16: qty 31, 31d supply, fill #0

## 2023-10-16 MED ORDER — ATROPINE SULFATE 1 MG/ML IV SOLN
INTRAVENOUS | Status: DC | PRN
Start: 2023-10-16 — End: 2023-10-16
  Administered 2023-10-16: 1 mg via INTRAVENOUS

## 2023-10-16 MED ORDER — APIXABAN 5 MG PO TABS
5.0000 mg | ORAL_TABLET | Freq: Two times a day (BID) | ORAL | Status: DC
  Administered 2023-10-16: 5 mg via ORAL
  Filled 2023-10-16: qty 1

## 2023-10-16 MED ORDER — SODIUM CHLORIDE 0.9% FLUSH: 3.0000 mL | Freq: Two times a day (BID) | INTRAVENOUS | Status: DC

## 2023-10-16 MED ORDER — MIDAZOLAM HCL 2 MG/2ML IJ SOLN
INTRAMUSCULAR | Status: AC
  Filled 2023-10-16: qty 2

## 2023-10-16 MED ORDER — MIDAZOLAM HCL 2 MG/2ML IJ SOLN
INTRAMUSCULAR | Status: DC | PRN
  Administered 2023-10-16: 2 mg via INTRAVENOUS

## 2023-10-16 MED ORDER — SODIUM CHLORIDE 0.9 % IV SOLN: INTRAVENOUS | Status: DC

## 2023-10-16 MED ORDER — FENTANYL CITRATE (PF) 100 MCG/2ML IJ SOLN
INTRAMUSCULAR | Status: AC
Start: 2023-10-16 — End: ?
  Filled 2023-10-16: qty 2

## 2023-10-16 MED ORDER — ROCURONIUM BROMIDE 10 MG/ML (PF) SYRINGE
PREFILLED_SYRINGE | INTRAVENOUS | Status: DC | PRN
  Administered 2023-10-16 (×2): 10 mg via INTRAVENOUS
  Administered 2023-10-16: 70 mg via INTRAVENOUS

## 2023-10-16 MED ORDER — ACETAMINOPHEN 325 MG PO TABS: 650.0000 mg | ORAL_TABLET | ORAL | Status: DC | PRN

## 2023-10-16 MED ORDER — SODIUM CHLORIDE 0.9 % IV SOLN: 250.0000 mL | INTRAVENOUS | Status: DC | PRN

## 2023-10-16 MED ORDER — HEPARIN SODIUM (PORCINE) 1000 UNIT/ML IJ SOLN
INTRAMUSCULAR | Status: AC
  Filled 2023-10-16: qty 10

## 2023-10-16 MED ORDER — PANTOPRAZOLE SODIUM 40 MG PO TBEC
40.0000 mg | DELAYED_RELEASE_TABLET | Freq: Every day | ORAL | Status: DC
  Administered 2023-10-16: 40 mg via ORAL
  Filled 2023-10-16 (×2): qty 1

## 2023-10-16 MED ORDER — PHENYLEPHRINE HCL-NACL 20-0.9 MG/250ML-% IV SOLN
INTRAVENOUS | Status: DC | PRN
  Administered 2023-10-16: 50 ug/min via INTRAVENOUS

## 2023-10-16 MED ORDER — ATROPINE SULFATE 1 MG/10ML IJ SOSY
PREFILLED_SYRINGE | INTRAMUSCULAR | Status: AC
  Filled 2023-10-16: qty 10

## 2023-10-16 MED ORDER — PROPOFOL 10 MG/ML IV BOLUS
INTRAVENOUS | Status: DC | PRN
  Administered 2023-10-16: 180 mg via INTRAVENOUS

## 2023-10-16 MED ORDER — PROTAMINE SULFATE 10 MG/ML IV SOLN
INTRAVENOUS | Status: DC | PRN
  Administered 2023-10-16: 15 mg via INTRAVENOUS
  Administered 2023-10-16: 20 mg via INTRAVENOUS

## 2023-10-16 MED ORDER — HEPARIN (PORCINE) IN NACL 1000-0.9 UT/500ML-% IV SOLN
INTRAVENOUS | Status: DC | PRN
  Administered 2023-10-16 (×3): 500 mL

## 2023-10-16 MED ORDER — SODIUM CHLORIDE 0.9% FLUSH: 3.0000 mL | INTRAVENOUS | Status: DC | PRN

## 2023-10-16 MED ORDER — COLCHICINE 0.6 MG PO TABS
0.6000 mg | ORAL_TABLET | Freq: Two times a day (BID) | ORAL | 0 refills | Status: DC
  Filled 2023-10-16: qty 10, 5d supply, fill #0

## 2023-10-16 MED ORDER — PROPOFOL 1000 MG/100ML IV EMUL
INTRAVENOUS | Status: AC
  Filled 2023-10-16: qty 100

## 2023-10-16 MED ORDER — PHENYLEPHRINE HCL-NACL 20-0.9 MG/250ML-% IV SOLN
INTRAVENOUS | Status: AC
  Filled 2023-10-16: qty 250

## 2023-10-16 MED ORDER — FENTANYL CITRATE (PF) 250 MCG/5ML IJ SOLN
INTRAMUSCULAR | Status: DC | PRN
Start: 2023-10-16 — End: 2023-10-16
  Administered 2023-10-16: 50 ug via INTRAVENOUS

## 2023-10-16 MED ORDER — ONDANSETRON HCL 4 MG/2ML IJ SOLN: 4.0000 mg | Freq: Four times a day (QID) | INTRAMUSCULAR | Status: DC | PRN

## 2023-10-16 MED ORDER — COLCHICINE 0.6 MG PO TABS
0.6000 mg | ORAL_TABLET | Freq: Two times a day (BID) | ORAL | Status: DC
  Administered 2023-10-16: 0.6 mg via ORAL
  Filled 2023-10-16 (×2): qty 1

## 2023-10-16 MED ORDER — LIDOCAINE 2% (20 MG/ML) 5 ML SYRINGE
INTRAMUSCULAR | Status: DC | PRN
  Administered 2023-10-16: 100 mg via INTRAVENOUS

## 2023-10-16 MED ORDER — HEPARIN SODIUM (PORCINE) 1000 UNIT/ML IJ SOLN
INTRAMUSCULAR | Status: DC | PRN
  Administered 2023-10-16: 15000 [IU] via INTRAVENOUS
  Administered 2023-10-16: 6000 [IU] via INTRAVENOUS

## 2023-10-16 MED ORDER — DEXAMETHASONE SODIUM PHOSPHATE 10 MG/ML IJ SOLN
INTRAMUSCULAR | Status: DC | PRN
  Administered 2023-10-16: 5 mg via INTRAVENOUS

## 2023-10-16 MED ORDER — SUGAMMADEX SODIUM 200 MG/2ML IV SOLN
INTRAVENOUS | Status: DC | PRN
  Administered 2023-10-16: 200 mg via INTRAVENOUS

## 2023-10-16 MED ORDER — ONDANSETRON HCL 4 MG/2ML IJ SOLN
INTRAMUSCULAR | Status: DC | PRN
  Administered 2023-10-16: 4 mg via INTRAVENOUS

## 2023-10-16 SURGICAL SUPPLY — 20 items
BAG SNAP BAND KOVER 36X36 (MISCELLANEOUS) IMPLANT
BLANKET WARM UNDERBOD FULL ACC (MISCELLANEOUS) ×1 IMPLANT
CABLE PFA RX CATH CONN (CABLE) IMPLANT
CATH FARAWAVE ABLATION 31 (CATHETERS) IMPLANT
CATH OCTARAY 2.0 F 3-3-3-3-3 (CATHETERS) IMPLANT
CATH SOUNDSTAR ECO 8FR (CATHETERS) IMPLANT
CATH WEBSTER BI DIR CS D-F CRV (CATHETERS) IMPLANT
CLOSURE PERCLOSE PROSTYLE (VASCULAR PRODUCTS) IMPLANT
COVER SWIFTLINK CONNECTOR (BAG) ×1 IMPLANT
DILATOR VESSEL 38 20CM 16FR (INTRODUCER) IMPLANT
GUIDEWIRE INQWIRE 1.5J.035X260 (WIRE) IMPLANT
INQWIRE 1.5J .035X260CM (WIRE) ×1 IMPLANT
KIT VERSACROSS CNCT FARADRIVE (KITS) IMPLANT
PACK EP LF (CUSTOM PROCEDURE TRAY) ×1 IMPLANT
PAD DEFIB RADIO PHYSIO CONN (PAD) ×1 IMPLANT
PATCH CARTO3 (PAD) IMPLANT
SHEATH FARADRIVE STEERABLE (SHEATH) IMPLANT
SHEATH PINNACLE 8F 10CM (SHEATH) IMPLANT
SHEATH PINNACLE 9F 10CM (SHEATH) IMPLANT
SHEATH PROBE COVER 6X72 (BAG) IMPLANT

## 2023-10-16 NOTE — Transfer of Care (Signed)
 Immediate Anesthesia Transfer of Care Note  Patient: Jack Russell  Procedure(s) Performed: ATRIAL FIBRILLATION ABLATION  Patient Location: PACU  Anesthesia Type:General  Level of Consciousness: awake, alert , and oriented  Airway & Oxygen Therapy: Patient Spontanous Breathing and Patient connected to face mask oxygen  Post-op Assessment: Report given to RN and Post -op Vital signs reviewed and stable  Post vital signs: Reviewed and stable  Last Vitals:  Vitals Value Taken Time  BP 121/83 10/16/23 0930  Temp    Pulse 67 10/16/23 0932  Resp 16 10/16/23 0932  SpO2 100 % 10/16/23 0932  Vitals shown include unfiled device data.  Last Pain:  Vitals:   10/16/23 0557  TempSrc:   PainSc: 0-No pain         Complications: There were no known notable events for this encounter.

## 2023-10-16 NOTE — H&P (Signed)
 Electrophysiology Office Note:     Date:  10/16/2023    ID:  RANARD HARTE, DOB Feb 04, 1961, MRN [id number]   [facility] Cardiologist:  None  [facility] Electrophysiologist:  Lanier Prude, MD    Referring MD: Christell Constant*    Chief Complaint: Atrial fibrillation   History of Present Illness:     Mr. Kleinfelter is a 63 year old man who I am seeing today for an evaluation of atrial fibrillation at the request of Dr. Izora Ribas.  The patient has a history of atrial fibrillation, hyperlipidemia.  He last saw Dr. Izora Ribas July 17, 2023.  At that appointment he reported increasing burden of atrial fibrillation over the last 2 to 3 years.  He has been taking pill in the pocket flecainide in the past which typically resolves the episode within 3 hours.   He brings in a chart today of his A-fib episodes.  He has multiple episodes per month, sometimes per week.  They are highly symptomatic.  At times they have led to him feeling presyncopal.  He has been taking as needed flecainide, up to 300 mg additional dosing.  He is on 50 twice daily right now.  He reliably takes diltiazem.  He is with his wife in clinic.   Presents for AF ablation today. Procedure reviewed.   Objective Their past medical, social and family history was reviewed.     ROS:   Please see the history of present illness.    All other systems reviewed and are negative.   EKGs/Labs/Other Studies Reviewed:     The following studies were reviewed today:   September 01, 2023 stress echo No ischemic findings EF 60% No significant valvular abnormalities   July 24, 2023 EKG shows sinus rhythm.  PR 166.  QRS duration 98 ms. EKG Interpretation Date/Time:                  Wednesday September 06 2023 14:11:03 EST Ventricular Rate:         58 PR Interval:                 162 QRS Duration:             96 QT Interval:                 426 QTC Calculation:418 R Axis:                         21    Text Interpretation:Sinus bradycardia Confirmed by Steffanie Dunn [phone]) on 09/06/2023 2:36:37 PM      Physical Exam:     VS:  BP 123/76   Pulse 62   Ht 6' (1.829 m)   Wt 196 lb 12.8 oz (89.3 kg)   SpO2 98%   BMI 26.69 kg/m         Wt Readings from Last 3 Encounters:  09/06/23 196 lb 12.8 oz (89.3 kg)  07/24/23 185 lb 3.2 oz (84 kg)  07/17/23 192 lb (87.1 kg)      GEN: no distress CARD: RRR, No MRG RESP: No IWOB. CTAB.         Assessment ASSESSMENT AND PLAN:     1. Paroxysmal A-fib (HCC)   2. Encounter for long-term (current) use of high-risk medication       #Paroxysmal atrial fibrillation #High risk drug monitoring-flecainide Highly symptomatic.  Previously managed with flecainide but has noticed an increased burden of symptomatic atrial fibrillation  despite treatment.  I discussed treatment options with the patient including alternative antiarrhythmic drugs and catheter ablation.  He would like to proceed with catheter ablation which I think is very reasonable.  He will need to start Eliquis 4 weeks prior to the procedure with plans to continue it for at least 3 months after the ablation procedure.  When he starts the Eliquis he will stop the aspirin.   Discussed treatment options today for AF including antiarrhythmic drug therapy and ablation. Discussed risks, recovery and likelihood of success with each treatment strategy. Risk, benefits, and alternatives to EP study and ablation for afib were discussed. These risks include but are not limited to stroke, bleeding, vascular damage, tamponade, perforation, damage to the esophagus, lungs, phrenic nerve and other structures, pulmonary vein stenosis, worsening renal function, coronary vasospasm and death.  Discussed potential need for repeat ablation procedures and antiarrhythmic drugs after an initial ablation. The patient understands these risk and wishes to proceed.  We will therefore proceed with catheter ablation at the  next available time.  Carto, ICE, anesthesia are requested for the procedure.  Will also obtain CT PV protocol prior to the procedure to exclude LAA thrombus and further evaluate atrial anatomy.   PR and QRS durations acceptable for ongoing flecainide use on today's EKG.   I am and have him increase his flecainide to 100 mg by mouth twice daily.  He will take that in addition to his diltiazem.  He will stop taking as needed flecainide.   Presents for AF ablation today. Procedure reviewed.   Signed, Rossie Muskrat. Lalla Brothers, MD, [facility], [facility] 10/16/2023 Electrophysiology [facility]

## 2023-10-16 NOTE — Anesthesia Postprocedure Evaluation (Signed)
 Anesthesia Post Note  Patient: Jack Russell  Procedure(s) Performed: ATRIAL FIBRILLATION ABLATION     Patient location during evaluation: PACU Anesthesia Type: General Level of consciousness: awake and alert Pain management: pain level controlled Vital Signs Assessment: post-procedure vital signs reviewed and stable Respiratory status: spontaneous breathing, nonlabored ventilation and respiratory function stable Cardiovascular status: blood pressure returned to baseline Postop Assessment: no apparent nausea or vomiting Anesthetic complications: no   There were no known notable events for this encounter.  Last Vitals:  Vitals:   10/16/23 0957 10/16/23 1000  BP:  130/82  Pulse:  65  Resp:  14  Temp: 36.4 C   SpO2:  98%    Last Pain:  Vitals:   10/16/23 0957  TempSrc: Axillary  PainSc: 0-No pain                 Shanda Howells

## 2023-10-16 NOTE — Discharge Instructions (Signed)

## 2023-10-16 NOTE — Anesthesia Procedure Notes (Signed)
 Procedure Name: Intubation Date/Time: 10/16/2023 7:45 AM  Performed by: Berniece Pap, CRNAPre-anesthesia Checklist: Patient identified, Emergency Drugs available, Suction available and Patient being monitored Patient Re-evaluated:Patient Re-evaluated prior to induction Oxygen Delivery Method: Circle system utilized Preoxygenation: Pre-oxygenation with 100% oxygen Induction Type: IV induction Ventilation: Mask ventilation without difficulty Laryngoscope Size: Mac and 4 Grade View: Grade I Tube type: Oral Tube size: 7.5 mm Number of attempts: 1 Airway Equipment and Method: Stylet and Oral airway Placement Confirmation: ETT inserted through vocal cords under direct vision, positive ETCO2 and breath sounds checked- equal and bilateral Secured at: 22 cm Tube secured with: Tape Dental Injury: Teeth and Oropharynx as per pre-operative assessment

## 2023-10-16 NOTE — Progress Notes (Signed)
 Up and walked and tolerated well; bilat groins stable, no new bleeding or hematoma; left groin has dime size pink drainage from cath lab holding marked and no change

## 2023-10-17 ENCOUNTER — Telehealth (HOSPITAL_COMMUNITY): Payer: Self-pay

## 2023-10-17 ENCOUNTER — Encounter: Payer: 59 | Admitting: Internal Medicine

## 2023-10-17 MED FILL — Fentanyl Citrate Soln Prefilled Syringe 100 MCG/2ML: INTRAMUSCULAR | Qty: 1 | Status: AC

## 2023-10-17 MED FILL — Atropine Sulfate Soln Prefill Syr 1 MG/10ML (0.1 MG/ML): INTRAMUSCULAR | Qty: 10 | Status: AC

## 2023-10-17 NOTE — Telephone Encounter (Signed)
 Attempted to reach patient to follow up with procedure completed on 10/16/23, no answer. Left VM for patient to return call.

## 2023-10-17 NOTE — Telephone Encounter (Signed)
 Spoke with patient to complete post procedure follow up call.  Patient removed large bandage at puncture site and reports no complications with groin sites.   Instructions reviewed with patient:  It is normal to have bruising, tenderness and a pea or marble sized lump/knot at the groin site which can take up to three months to resolve.  Get help right away if you notice sudden swelling at the puncture site.  Check your puncture site every day for signs of infection: fever, redness, swelling, pus drainage, warmth, foul odor or excessive pain. If this occurs, please call the office at [phone], to speak with the nurse. Get help right away if your puncture site is bleeding and the bleeding does not stop after applying firm pressure to the area.  You may continue to have skipped beats/ atrial fibrillation during the first several months after your procedure.  It is very important not to miss any doses of your blood thinner Eliquis. Patient restarted taking this medication on yesterday, 10/16/23.  You will follow up with the Afib clinic on 11/13/23 and follow up with the APP on 01/16/24.   Patient verbalized understanding to all instructions provided.

## 2023-10-18 ENCOUNTER — Other Ambulatory Visit (HOSPITAL_COMMUNITY): Payer: Self-pay

## 2023-10-23 ENCOUNTER — Encounter: Payer: 59 | Admitting: Internal Medicine

## 2023-11-13 ENCOUNTER — Ambulatory Visit (HOSPITAL_COMMUNITY)
Admit: 2023-11-13 | Discharge: 2023-11-13 | Disposition: A | Discharge status: Home / Self Care | Source: Ambulatory Visit | Attending: Physician Assistant | Admitting: Physician Assistant

## 2023-11-13 ENCOUNTER — Encounter (HOSPITAL_COMMUNITY): Payer: Self-pay | Admitting: Physician Assistant

## 2023-11-13 VITALS — BP 138/88 | HR 75 | Ht 72.0 in | Wt 192.8 lb

## 2023-11-13 DIAGNOSIS — Z7901 Long term (current) use of anticoagulants: Secondary | ICD-10-CM | POA: Diagnosis not present

## 2023-11-13 DIAGNOSIS — Z79899 Other long term (current) drug therapy: Secondary | ICD-10-CM | POA: Diagnosis not present

## 2023-11-13 DIAGNOSIS — E785 Hyperlipidemia, unspecified: Secondary | ICD-10-CM | POA: Insufficient documentation

## 2023-11-13 DIAGNOSIS — Z5181 Encounter for therapeutic drug level monitoring: Secondary | ICD-10-CM | POA: Diagnosis present

## 2023-11-13 DIAGNOSIS — I2510 Atherosclerotic heart disease of native coronary artery without angina pectoris: Secondary | ICD-10-CM | POA: Diagnosis not present

## 2023-11-13 DIAGNOSIS — I480 Paroxysmal atrial fibrillation: Secondary | ICD-10-CM | POA: Diagnosis not present

## 2023-11-13 NOTE — Progress Notes (Addendum)
 Primary Care Physician: Jack Genet, MD Primary Cardiologist: None Electrophysiologist: Boyce Byes, MD  Referring Physician: Dr Danton Dyers is a 63 y.o. male with a history of HLD and atrial fibrillation who presents for follow up in the [facility].  The patient was initially diagnosed with atrial fibrillation remotely and was initially maintained on flecainide . He started to have more frequent episodes and was seen by Dr Marven Slimmer who performed an ablation on 10/16/23. Patient is on Eliquis  for stroke prevention.   Patient presents today for follow up for atrial fibrillation. He reports that he has not had any interim episodes of afib. He denies chest pain or groin issues.   Today, he denies symptoms of palpitations, chest pain, shortness of breath, orthopnea, PND, lower extremity edema, dizziness, presyncope, syncope, snoring, daytime somnolence, bleeding, or neurologic sequela. The patient is tolerating medications without difficulties and is otherwise without complaint today.    Atrial Fibrillation Risk Factors:  he does not have symptoms or diagnosis of sleep apnea. he does not have a history of rheumatic fever.   Atrial Fibrillation Management history:  Previous antiarrhythmic drugs: flecainide   Previous cardioversions: none Previous ablations: 10/16/23 Anticoagulation history: Eliquis   ROS- All systems are reviewed and negative except as per the HPI above.  Past Medical History:  Diagnosis Date   Abnormal glucose    Anemia    Arthritis    Diverticulitis    GERD (gastroesophageal reflux disease)    HTN (hypertension)    Hyperlipidemia    Other testicular hypofunction    Paroxysmal atrial fibrillation (HCC)    Pulmonary nodule    Sebaceous cyst    Sigmoid diverticulitis 05/21/2018   Testosterone  deficiency    Vitamin D  deficiency     Current Outpatient Medications  Medication Sig Dispense Refill   acetaminophen   (TYLENOL ) 650 MG CR tablet Take 1,300 mg by mouth every 8 (eight) hours as needed for pain.     apixaban  (ELIQUIS ) 5 MG TABS tablet Take 1 tablet (5 mg total) by mouth 2 (two) times daily. 180 tablet 1   ascorbic acid (VITAMIN C) 500 MG tablet Take 500 mg by mouth daily.     Cholecalciferol (VITAMIN D3 ULTRA STRENGTH) 125 MCG (5000 UT) capsule Take 5,000 Units by mouth daily.     Cyanocobalamin (B-12) 3000 MCG CAPS Take 3,000 mcg by mouth every Thursday.     diltiazem  (CARTIA  XT) 240 MG 24 hr capsule Take 1 capsule Daily with Supper for BP & Heart Rhythm 90 capsule 0   flecainide  (TAMBOCOR ) 100 MG tablet Take 1 tablet (100 mg total) by mouth 2 (two) times daily. 180 tablet 3   GARLIC PO Take 50 mg by mouth 2 (two) times daily.     ibuprofen  (ADVIL ) 200 MG tablet Take 400 mg by mouth every 6 (six) hours as needed for moderate pain (pain score 4-6).     Lutein 20 MG CAPS Take 20 mg by mouth daily.     Omega-3 Fatty Acids (FISH OIL) 1200 MG CAPS Take 1,200 mg by mouth daily.     pantoprazole  (PROTONIX ) 40 MG tablet Take 1 tablet (40 mg total) by mouth daily. 45 tablet 0   TURMERIC PO Take 2,250 mg by mouth daily.     Zinc 30 MG CAPS Take 30 mg by mouth daily.     colchicine  0.6 MG tablet Take 1 tablet (0.6 mg total) by mouth 2 (two) times daily for 5  days. 10 tablet 0   No current facility-administered medications for this encounter.    Physical Exam: BP 138/88   Pulse 75   Ht 6' (1.829 m)   Wt 87.5 kg   BMI 26.15 kg/m   GEN: Well nourished, well developed in no acute distress CARDIAC: Regular rate and rhythm, no murmurs, rubs, gallops RESPIRATORY:  Clear to auscultation without rales, wheezing or rhonchi  ABDOMEN: Soft, non-tender, non-distended EXTREMITIES:  No edema; No deformity   Wt Readings from Last 3 Encounters:  11/13/23 87.5 kg  10/16/23 86.2 kg  09/06/23 89.3 kg     EKG today demonstrates  SR Vent. rate 75 BPM PR interval 186 ms QRS duration 108 ms QT/QTcB 408/455  ms  Echo 08/18/23 demonstrated   1. Left ventricular ejection fraction, by estimation, is 55 to 60%. Left  ventricular ejection fraction by 3D volume is 55 %. The left ventricle has  normal function. The left ventricle has no regional wall motion  abnormalities. Left ventricular diastolic parameters are consistent with Grade I diastolic dysfunction (impaired relaxation). The average left ventricular global longitudinal strain is -26.8 %. The global longitudinal strain is normal.   2. Right ventricular systolic function is normal. The right ventricular  size is normal. There is normal pulmonary artery systolic pressure. The  estimated right ventricular systolic pressure is 28.8 mmHg.   3. The mitral valve is normal in structure. No evidence of mitral valve  regurgitation. No evidence of mitral stenosis.   4. The aortic valve is tricuspid. Aortic valve regurgitation is not  visualized. No aortic stenosis is present.   Comparison(s): Unable to view 2018 comparison.    CHA2DS2-VASc Score = 1  The patient's score is based upon: CHF History: 0 HTN History: 0 Diabetes History: 0 Stroke History: 0 Vascular Disease History: 1 Age Score: 0 Gender Score: 0       ASSESSMENT AND PLAN: Paroxysmal Atrial Fibrillation (ICD10:  I48.0) The patient's CHA2DS2-VASc score is 1, indicating a 0.6% annual risk of stroke.   S/p afib ablation 10/16/23 Patient appears to be maintaining SR Continue flecainide  100 mg BID for now Continue diltiazem  240 mg daily Continue Eliquis  5 mg BID with no missed doses for 3 months post ablation.   High Risk Medication Monitoring (ICD 10: Z79.899) Intervals on ECG acceptable for flecainide  monitoring.      CAD CAC score 102 No anginal symptoms    Follow up with Michaelle Adolphus as scheduled.        Myrtha Ates PA-C Afib Clinic [facility] [address] [place], [place] [address] [phone]

## 2024-01-15 NOTE — Progress Notes (Unsigned)
  Electrophysiology Office Note:   Date:  01/16/2024  ID:  DEUNDRA BARD, DOB 08-Aug-1960, MRN [id number]  Primary Cardiologist: None Electrophysiologist: OLE ONEIDA HOLTS, MD      History of Present Illness:   Jack Russell is a 63 y.o. male with h/o HLD, PAF, and CAD on CT seen today for routine electrophysiology followup.   Since last being seen in our clinic the patient reports doing very well. No breakthrough AF that he is aware of. Currently, he denies chest pain, palpitations, dyspnea, PND, orthopnea, nausea, vomiting, dizziness, syncope, edema, weight gain, or early satiety.   Review of systems complete and found to be negative unless listed in HPI.   EP Information / Studies Reviewed:    EKG is ordered today. Personal review as below.  EKG Interpretation Date/Time:  Tuesday January 16 2024 09:18:51 EDT Ventricular Rate:  62 PR Interval:  198 QRS Duration:  106 QT Interval:  432 QTC Calculation: 438 R Axis:   -33  Text Interpretation: Normal sinus rhythm Left axis deviation When compared with ECG of 13-Nov-2023 09:47, QRS axis Shifted left Confirmed by Lesia Sharper [phone]) on 01/16/2024 9:32:56 AM    Arrhythmia/Device History S/p PVI and posterior wall ablation 10/16/2023   Physical Exam:   VS:  BP 118/76   Pulse 62   Ht 6' (1.829 m)   Wt 188 lb 12.8 oz (85.6 kg)   SpO2 95%   BMI 25.61 kg/m    Wt Readings from Last 3 Encounters:  01/16/24 188 lb 12.8 oz (85.6 kg)  11/13/23 192 lb 12.8 oz (87.5 kg)  10/16/23 190 lb (86.2 kg)     GEN: No acute distress NECK: No JVD; No carotid bruits CARDIAC: Regular rate and rhythm, no murmurs, rubs, gallops RESPIRATORY:  Clear to auscultation without rales, wheezing or rhonchi  ABDOMEN: Soft, non-tender, non-distended EXTREMITIES:  No edema; No deformity   ASSESSMENT AND PLAN:    Paroxysmal Atrial fibrillation S/p ablation 10/16/2023 EKG today shows NSR CHA2DS2VASc of at least 1.  STOP flecainide  STOP Eliquis  STOP  diltiazem    He will continue to monitor AF through his watch.   High risk medication monitoring - Flecainide  Stopping today.   CAD No s/s of ischemia.     CAC score of 102.   Follow up with EP Team in 6 months  Signed, Sharper Prentice Lesia, PA-C

## 2024-01-16 ENCOUNTER — Ambulatory Visit: Attending: Student | Admitting: Student

## 2024-01-16 ENCOUNTER — Encounter: Payer: Self-pay | Admitting: Student

## 2024-01-16 VITALS — BP 118/76 | HR 62 | Ht 72.0 in | Wt 188.8 lb

## 2024-01-16 DIAGNOSIS — Z79899 Other long term (current) drug therapy: Secondary | ICD-10-CM

## 2024-01-16 DIAGNOSIS — I480 Paroxysmal atrial fibrillation: Secondary | ICD-10-CM | POA: Diagnosis not present

## 2024-01-16 DIAGNOSIS — I2510 Atherosclerotic heart disease of native coronary artery without angina pectoris: Secondary | ICD-10-CM | POA: Diagnosis not present

## 2024-01-16 NOTE — Patient Instructions (Signed)
 Medication Instructions:  1.Stop eliquis  2.Stop flecainide  3.Stop diltiazem  *If you need a refill on your cardiac medications before your next appointment, please call your pharmacy*  Lab Work: None ordered If you have labs (blood work) drawn today and your tests are completely normal, you will receive your results only by: MyChart Message (if you have MyChart) OR A paper copy in the mail If you have any lab test that is abnormal or we need to change your treatment, we will call you to review the results.  Follow-Up: At [facility], you and your health needs are our priority.  As part of our continuing mission to provide you with exceptional heart care, our providers are all part of one team.  This team includes your primary Cardiologist (physician) and Advanced Practice Providers or APPs (Physician Assistants and Nurse Practitioners) who all work together to provide you with the care you need, when you need it.  Your next appointment:   6 month(s)  Provider:   Ole Holts, MD

## 2024-04-05 ENCOUNTER — Encounter: Payer: Self-pay | Admitting: Family Medicine

## 2024-04-05 ENCOUNTER — Ambulatory Visit: Admitting: Family Medicine

## 2024-04-05 VITALS — BP 137/86 | HR 62 | Ht 72.0 in | Wt 185.0 lb

## 2024-04-05 DIAGNOSIS — Z7689 Persons encountering health services in other specified circumstances: Secondary | ICD-10-CM

## 2024-04-05 DIAGNOSIS — Z8249 Family history of ischemic heart disease and other diseases of the circulatory system: Secondary | ICD-10-CM

## 2024-04-05 DIAGNOSIS — Z23 Encounter for immunization: Secondary | ICD-10-CM

## 2024-04-05 DIAGNOSIS — E559 Vitamin D deficiency, unspecified: Secondary | ICD-10-CM | POA: Diagnosis not present

## 2024-04-05 DIAGNOSIS — E782 Mixed hyperlipidemia: Secondary | ICD-10-CM | POA: Diagnosis not present

## 2024-04-05 DIAGNOSIS — I10 Essential (primary) hypertension: Secondary | ICD-10-CM

## 2024-04-05 DIAGNOSIS — L821 Other seborrheic keratosis: Secondary | ICD-10-CM

## 2024-04-05 DIAGNOSIS — M79644 Pain in right finger(s): Secondary | ICD-10-CM | POA: Diagnosis not present

## 2024-04-05 NOTE — Assessment & Plan Note (Addendum)
-   Reports worsening symptoms over 5 years with inability to fully extend the thumb. Examination reveals limited extension. Differential includes trigger thumb versus arthritis. -  pt wishes to hold off on xrays currently.  Will reconsider if pain worsens

## 2024-04-05 NOTE — Assessment & Plan Note (Signed)
 Adding lpa and apo b to pt's lipid panel.  Had CAC in march 2025 that showed score of 83rd percentile for age

## 2024-04-05 NOTE — Progress Notes (Signed)
 New Patient Office Visit  Subjective   Patient ID: Jack Russell, male    DOB: 1960-08-27  Age: 63 y.o. MRN: [id number]  CC:  Chief Complaint  Patient presents with   New Patient (Initial Visit)    HPI Jack Russell presents to establish care  Subjective - New patient establishing care, previously with Dr. Tonita for over 40 years.  - Reports issue with right thumb for approximately 5 years, now worsening. Reports inability to fully extend the thumb. Notes a small bump. Denies a palpable cord or swelling. Denies thumb getting stuck. Uses hands frequently for work, squeezing pliers.  - Reports occasional locking of the right index or middle finger, requiring manual extension.  - Reports a skin lesion on the scalp for a couple of years. It sometimes increases in size then regresses. Verneda suggested it was a flat wart. Not bothersome, no redness. Notes a similar, smaller lesion nearby.  Medications No current prescription medications. Previously on flecainide , diltiazem , and Eliquis  for atrial fibrillation, all stopped after a successful ablation. Was on a statin for cholesterol but discontinued it 6-12 months ago. Takes various vitamins, minerals, and supplements.  PMH, PSH, FH, Social Hx PMHx: Atrial fibrillation, status post successful ablation. Hypercholesterolemia. Meniscus tears in both knees. Boutonniere deformity/fracture of a finger, surgically repaired. PSH: Meniscus repair, bilateral. Finger fracture repair. FH: Paternal grandfather died of a heart attack at age 36. Father and three paternal uncles all had open-heart surgery in their 37s for heart disease. Maternal grandmother had colon cancer. Social Hx: Married with two children and one grandchild, another expected. Retired from [employer] in 2022. 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 Denies tobacco or alcohol use. Exercises by walking 2-3 miles, 3-4 times per week. Reports being conscious of diet but notes  it did not significantly impact cholesterol levels in the past.  ROS MSK: Positive for right thumb pain and limited extension, occasional locking of right index/middle finger. Negative for swelling in the thumb. Skin: Positive for two non-painful, non-erythematous lesions on the scalp.   Outpatient Encounter Medications as of 04/05/2024  Medication Sig   acetaminophen  (TYLENOL ) 650 MG CR tablet Take 1,300 mg by mouth every 8 (eight) hours as needed for pain.   ascorbic acid (VITAMIN C) 500 MG tablet Take 500 mg by mouth daily.   Cholecalciferol (VITAMIN D3 ULTRA STRENGTH) 125 MCG (5000 UT) capsule Take 5,000 Units by mouth daily.   Cyanocobalamin (B-12) 3000 MCG CAPS Take 3,000 mcg by mouth every Thursday.   GARLIC PO Take 50 mg by mouth 2 (two) times daily.   ibuprofen  (ADVIL ) 200 MG tablet Take 400 mg by mouth every 6 (six) hours as needed for moderate pain (pain score 4-6).   Lutein 20 MG CAPS Take 20 mg by mouth daily.   Omega-3 Fatty Acids (FISH OIL) 1200 MG CAPS Take 1,200 mg by mouth daily.   TURMERIC PO Take 2,250 mg by mouth daily.   Zinc 30 MG CAPS Take 30 mg by mouth daily.   No facility-administered encounter medications on file as of 04/05/2024.    Past Medical History:  Diagnosis Date   Abnormal glucose    Anemia    Arrhythmia    Arthritis    Diverticulitis    GERD (gastroesophageal reflux disease)    HTN (hypertension)    Hyperlipidemia    Other testicular hypofunction    Paroxysmal atrial fibrillation (HCC)    Pulmonary nodule    Sebaceous cyst  Sigmoid diverticulitis 05/21/2018   Testosterone  deficiency    Vitamin D  deficiency     Past Surgical History:  Procedure Laterality Date   ATRIAL FIBRILLATION ABLATION N/A 10/16/2023   Procedure: ATRIAL FIBRILLATION ABLATION;  Surgeon: Cindie Ole DASEN, MD;  Location: [facility];  Service: Cardiovascular;  Laterality: N/A;   CHONDROPLASTY Left 09/15/2016   Procedure: CHONDROPLASTY;  Surgeon: Toribio JULIANNA Chancy, MD;  Location: [facility];  Service: Orthopedics;  Laterality: Left;   COLONOSCOPY  last 06/15/2009   KNEE ARTHROSCOPY W/ MENISCAL REPAIR  2009   right   KNEE ARTHROSCOPY WITH MEDIAL MENISECTOMY Left 09/15/2016   Procedure: LEFT KNEE ARTHROSCOPY CHONDROPLASTY  WITH MEDIAL MENISECTOMY;  Surgeon: Toribio JULIANNA Chancy, MD;  Location: [facility];  Service: Orthopedics;  Laterality: Left;   PROXIMAL INTERPHALANGEAL FUSION (PIP) Left 05/14/2013   Procedure: RECONSTRUCTION BOUTONNIERE (LEFT SMALL FINGER) PINNING PROXIMAL INTERPHALANGEAL FUSION (PIP);  Surgeon: Arley JONELLE Curia, MD;  Location: [facility];  Service: Orthopedics;  Laterality: Left;    Family History  Problem Relation Age of Onset   Transient ischemic attack Mother    Heart disease Father    Hypertension Father    Cancer Maternal Grandmother        colon    Colon cancer Maternal Grandmother 40   Stomach cancer Neg Hx    Rectal cancer Neg Hx    Esophageal cancer Neg Hx    Liver cancer Neg Hx    Colon polyps Neg Hx     Social History   Socioeconomic History   Marital status: Married    Spouse name: Not on file   Number of children: 2   Years of education: Not on file   Highest education level: 12th grade  Occupational History   Occupation: Retail buyer: [employer]  Tobacco Use   Smoking status: Never   Smokeless tobacco: Never   Tobacco comments:    Never smoked 11/13/23  Vaping Use   Vaping status: Never Used  Substance and Sexual Activity   Alcohol use: No   Drug use: No   Sexual activity: Yes    Birth control/protection: None  Other Topics Concern   Not on file  Social History Narrative   Works as a Pensions consultant for [employer]   Social Drivers of Health   Financial Resource Strain: Low Risk  (04/04/2024)   Overall Financial Resource Strain (CARDIA)    Difficulty of Paying Living Expenses: Not hard at all  Food Insecurity: No Food Insecurity (04/04/2024)    Hunger Vital Sign    Worried About Running Out of Food in the Last Year: Never true    Ran Out of Food in the Last Year: Never true  Transportation Needs: No Transportation Needs (04/04/2024)   PRAPARE - Administrator, Civil Service (Medical): No    Lack of Transportation (Non-Medical): No  Physical Activity: Sufficiently Active (04/04/2024)   Exercise Vital Sign    Days of Exercise per Week: 4 days    Minutes of Exercise per Session: 50 min  Stress: No Stress Concern Present (04/04/2024)   [employer] of Occupational Health - Occupational Stress Questionnaire    Feeling of Stress: Not at all  Social Connections: Socially Integrated (04/04/2024)   Social Connection and Isolation Panel    Frequency of Communication with Friends and Family: More than three times a week    Frequency of Social Gatherings with Friends and Family:  More than three times a week    Attends Religious Services: More than 4 times per year    Active Member of Clubs or Organizations: Yes    Attends Banker Meetings: More than 4 times per year    Marital Status: Married  Catering manager Violence: Unknown (10/27/2021)   Received from [facility]   HITS    Physically Hurt: Not on file    Insult or Talk Down To: Not on file    Threaten Physical Harm: Not on file    Scream or Curse: Not on file    ROS     Objective   BP 137/86   Pulse 62   Ht 6' (1.829 m)   Wt 185 lb (83.9 kg)   SpO2 98%   BMI 25.09 kg/m   Physical Exam Gen: alert, oriented. Appears younger than stated age.  Heent: perrla, eomi, mmm Cv: rrr, no murmur Pulm: lctab, no wheeze Gi: soft, nontender. Nbs Msk: strength equal b/l.  EXTREMITIES: Right thumb with limited extension and mild swelling noted compared to the left. SKIN: Two lesions on the scalp consistent in appearance with seborrheic keratosis. Psych: pleasant affect     Assessment & Plan:   Encounter to establish care  FHx: heart  disease Assessment & Plan: Adding lpa and apo b to pt's lipid panel.  Had CAC in march 2025 that showed score of 83rd percentile for age  Orders: -     Lipoprotein A (LPA) -     Apolipoprotein B  Hyperlipidemia, mixed -     TSH -     Lipid panel -     Hemoglobin A1c -     Lipoprotein A (LPA) -     Apolipoprotein B  Vitamin D  deficiency  Flu vaccine need -     Flu vaccine trivalent PF, 6mos and older(Flulaval,Afluria,Fluarix,Fluzone )  Immunization due  Seborrheic keratoses Assessment & Plan: - Two stable lesions on the scalp present for years. Appearance is benign, consistent with seborrheic keratosis. - Plan for cryotherapy at the next visit if desired. Patient to make a standard 20-minute follow-up appointment for this in approximately six months. Will treat both lesions at that time.   Pain of right thumb Assessment & Plan: - Reports worsening symptoms over 5 years with inability to fully extend the thumb. Examination reveals limited extension. Differential includes trigger thumb versus arthritis. -  pt wishes to hold off on xrays currently.  Will reconsider if pain worsens   Essential hypertension -     TSH    Return in about 6 months (around 10/03/2024) for f/u chronic issues and freeze a sk.   Toribio MARLA Slain, MD

## 2024-04-05 NOTE — Patient Instructions (Addendum)
 It was nice to see you today,  We addressed the following topics today: -I will get some labs today and then follow-up with you regarding results - At your next visit in 6 months we can freeze off the lesions on your face. - If you want to get the shingles tetanus or pneumococcal vaccine, you can get that done at a pharmacy.  You are due for your second shingles vaccine I believe, and the pneumococcal you had not had yet.  The tetanus is overdue since it has been more than 10 years.  Have a great day,  Rolan Slain, MD

## 2024-04-05 NOTE — Assessment & Plan Note (Signed)
-   Two stable lesions on the scalp present for years. Appearance is benign, consistent with seborrheic keratosis. - Plan for cryotherapy at the next visit if desired. Patient to make a standard 20-minute follow-up appointment for this in approximately six months. Will treat both lesions at that time.

## 2024-04-07 LAB — LIPOPROTEIN A (LPA): Lipoprotein (a): <8.4 nmol/L (ref <75.0)

## 2024-04-07 LAB — LIPID PANEL
Chol/HDL Ratio: 4.7 ratio (ref 0.0–5.0)
Cholesterol, Total: 227 mg/dL — ABNORMAL HIGH (ref 100–199)
HDL: 48 mg/dL (ref >39)
LDL Chol Calc (NIH): 162 mg/dL — ABNORMAL HIGH (ref 0–99)
Triglycerides: 93 mg/dL (ref 0–149)
VLDL Cholesterol Cal: 17 mg/dL (ref 5–40)

## 2024-04-07 LAB — APOLIPOPROTEIN B: Apolipoprotein B: 118 mg/dL — ABNORMAL HIGH (ref <90)

## 2024-04-07 LAB — HEMOGLOBIN A1C
Est. average glucose Bld gHb Est-mCnc: 111 mg/dL
Hgb A1c MFr Bld: 5.5 % (ref 4.8–5.6)

## 2024-04-07 LAB — TSH: TSH: 2.66 u[IU]/mL (ref 0.450–4.500)

## 2024-04-10 ENCOUNTER — Other Ambulatory Visit: Payer: Self-pay | Admitting: Family Medicine

## 2024-04-10 ENCOUNTER — Ambulatory Visit: Payer: Self-pay | Admitting: Family Medicine

## 2024-04-10 MED ORDER — ATORVASTATIN CALCIUM 40 MG PO TABS: ORAL_TABLET | ORAL | 3 refills | Status: AC

## 2024-07-07 NOTE — Progress Notes (Unsigned)
°  Electrophysiology Office Follow up Visit Note:    Date:  07/08/2024   ID:  Jack Russell, DOB Apr 15, 1961, MRN [id number]  PCP:  Chandra Toribio POUR, MD  [facility] Cardiologist:  None  [facility] Electrophysiologist:  OLE ONEIDA HOLTS, MD    Interval History:     Jack Russell is a 63 y.o. male who presents for a follow up visit.   The patient was seen by [facility] January 16, 2024.  The patient has a history of atrial fibrillation, hyperlipidemia and coronary artery disease.  He had a prior catheter ablation for atrial fibrillation October 16, 2023.  At the appointment with Jodie, flecainide  Eliquis  and diltiazem  were all stopped.  He was monitoring his heart rhythm using an Apple watch.  He is doing well.  We discussed statins during today's clinic appointment.      Past medical, surgical, social and family history were reviewed.  ROS:   Please see the history of present illness.    All other systems reviewed and are negative.  EKGs/Labs/Other Studies Reviewed:    The following studies were reviewed today:     EKG Interpretation Date/Time:  Monday July 08 2024 10:20:46 EST Ventricular Rate:  65 PR Interval:  160 QRS Duration:  90 QT Interval:  396 QTC Calculation: 411 R Axis:   58  Text Interpretation: Normal sinus rhythm Normal ECG Confirmed by Holts Ole [phone]) on 07/08/2024 10:25:20 AM    Physical Exam:    VS:  BP 124/70   Pulse 65   Ht 6' (1.829 m)   Wt 186 lb 6.4 oz (84.6 kg)   SpO2 94%   BMI 25.28 kg/m     Wt Readings from Last 3 Encounters:  07/08/24 186 lb 6.4 oz (84.6 kg)  04/05/24 185 lb (83.9 kg)  01/16/24 188 lb 12.8 oz (85.6 kg)     GEN: no distress CARD: RRR, No MRG RESP: No IWOB. CTAB.      ASSESSMENT:    1. Paroxysmal A-fib (HCC)   2. Coronary artery disease involving native coronary artery of native heart without angina pectoris    PLAN:    In order of problems listed above:  #Atrial fibrillation Doing well off  flecainide  and diltiazem .  Prior ablation in March 2025. Continue aspirin 81 mg by mouth once daily I encouraged him to continue monitoring his heart rhythm using a wearable device  I discussed my upcoming departure from [facility] during today's clinic appointment.  The patient will continue to follow-up with one of my EP partners moving forward.  #Coronary artery disease We reviewed his CT scan demonstrating coronary artery calcium .  We reviewed evidence supporting statin use and similar populations.  I would recommend continuing statins at the current dose.  Follow-up 1 year with EP APP   Signed, Ole Holts, MD, [facility], [facility] 07/08/2024 10:35 AM    Electrophysiology [facility]

## 2024-07-08 ENCOUNTER — Ambulatory Visit: Attending: Cardiology | Admitting: Cardiology

## 2024-07-08 ENCOUNTER — Encounter: Payer: Self-pay | Admitting: Cardiology

## 2024-07-08 VITALS — BP 124/70 | HR 65 | Ht 72.0 in | Wt 186.4 lb

## 2024-07-08 DIAGNOSIS — I2510 Atherosclerotic heart disease of native coronary artery without angina pectoris: Secondary | ICD-10-CM

## 2024-07-08 DIAGNOSIS — I480 Paroxysmal atrial fibrillation: Secondary | ICD-10-CM | POA: Diagnosis not present

## 2024-07-08 NOTE — Patient Instructions (Signed)

## 2024-10-04 ENCOUNTER — Ambulatory Visit: Admitting: Family Medicine
